# Patient Record
Sex: Male | Born: 1935
Health system: Southern US, Community
[De-identification: ages and names within clinical notes are randomized; demographics above are authoritative.]

## PROBLEM LIST (undated history)

## (undated) DIAGNOSIS — F99 Mental disorder, not otherwise specified: Secondary | ICD-10-CM

## (undated) DIAGNOSIS — F32A Depression, unspecified: Secondary | ICD-10-CM

## (undated) DIAGNOSIS — N400 Enlarged prostate without lower urinary tract symptoms: Secondary | ICD-10-CM

## (undated) DIAGNOSIS — M1990 Unspecified osteoarthritis, unspecified site: Secondary | ICD-10-CM

## (undated) DIAGNOSIS — M109 Gout, unspecified: Secondary | ICD-10-CM

## (undated) DIAGNOSIS — F329 Major depressive disorder, single episode, unspecified: Secondary | ICD-10-CM

## (undated) HISTORY — PX: CHOLECYSTECTOMY: SHX55

## (undated) HISTORY — PX: BACK SURGERY: SHX140

---

## 2005-03-10 ENCOUNTER — Ambulatory Visit: Payer: Self-pay | Admitting: Cardiology

## 2011-09-02 DIAGNOSIS — F329 Major depressive disorder, single episode, unspecified: Secondary | ICD-10-CM | POA: Diagnosis not present

## 2011-09-02 DIAGNOSIS — E871 Hypo-osmolality and hyponatremia: Secondary | ICD-10-CM | POA: Diagnosis not present

## 2011-09-02 DIAGNOSIS — M109 Gout, unspecified: Secondary | ICD-10-CM | POA: Diagnosis not present

## 2011-09-02 DIAGNOSIS — G252 Other specified forms of tremor: Secondary | ICD-10-CM | POA: Diagnosis not present

## 2011-09-02 DIAGNOSIS — F411 Generalized anxiety disorder: Secondary | ICD-10-CM | POA: Diagnosis not present

## 2011-09-02 DIAGNOSIS — J449 Chronic obstructive pulmonary disease, unspecified: Secondary | ICD-10-CM | POA: Diagnosis not present

## 2011-10-14 DIAGNOSIS — J449 Chronic obstructive pulmonary disease, unspecified: Secondary | ICD-10-CM | POA: Diagnosis not present

## 2011-10-14 DIAGNOSIS — M109 Gout, unspecified: Secondary | ICD-10-CM | POA: Diagnosis not present

## 2011-10-17 DIAGNOSIS — I739 Peripheral vascular disease, unspecified: Secondary | ICD-10-CM | POA: Diagnosis not present

## 2011-12-12 DIAGNOSIS — M109 Gout, unspecified: Secondary | ICD-10-CM | POA: Diagnosis not present

## 2011-12-12 DIAGNOSIS — J449 Chronic obstructive pulmonary disease, unspecified: Secondary | ICD-10-CM | POA: Diagnosis not present

## 2011-12-22 DIAGNOSIS — H2510 Age-related nuclear cataract, unspecified eye: Secondary | ICD-10-CM | POA: Diagnosis not present

## 2011-12-22 DIAGNOSIS — Z961 Presence of intraocular lens: Secondary | ICD-10-CM | POA: Diagnosis not present

## 2011-12-22 DIAGNOSIS — H5200 Hypermetropia, unspecified eye: Secondary | ICD-10-CM | POA: Diagnosis not present

## 2012-01-29 DIAGNOSIS — I739 Peripheral vascular disease, unspecified: Secondary | ICD-10-CM | POA: Diagnosis not present

## 2012-02-04 DIAGNOSIS — M109 Gout, unspecified: Secondary | ICD-10-CM | POA: Diagnosis not present

## 2012-02-04 DIAGNOSIS — F411 Generalized anxiety disorder: Secondary | ICD-10-CM | POA: Diagnosis not present

## 2012-02-04 DIAGNOSIS — F329 Major depressive disorder, single episode, unspecified: Secondary | ICD-10-CM | POA: Diagnosis not present

## 2012-02-04 DIAGNOSIS — J449 Chronic obstructive pulmonary disease, unspecified: Secondary | ICD-10-CM | POA: Diagnosis not present

## 2012-02-06 DIAGNOSIS — J449 Chronic obstructive pulmonary disease, unspecified: Secondary | ICD-10-CM | POA: Diagnosis not present

## 2012-02-06 DIAGNOSIS — R319 Hematuria, unspecified: Secondary | ICD-10-CM | POA: Diagnosis not present

## 2012-02-06 DIAGNOSIS — F411 Generalized anxiety disorder: Secondary | ICD-10-CM | POA: Diagnosis not present

## 2012-02-06 DIAGNOSIS — F329 Major depressive disorder, single episode, unspecified: Secondary | ICD-10-CM | POA: Diagnosis not present

## 2012-02-06 DIAGNOSIS — M109 Gout, unspecified: Secondary | ICD-10-CM | POA: Diagnosis not present

## 2012-02-06 DIAGNOSIS — R351 Nocturia: Secondary | ICD-10-CM | POA: Diagnosis not present

## 2012-02-09 DIAGNOSIS — M109 Gout, unspecified: Secondary | ICD-10-CM | POA: Diagnosis not present

## 2012-02-09 DIAGNOSIS — F329 Major depressive disorder, single episode, unspecified: Secondary | ICD-10-CM | POA: Diagnosis not present

## 2012-02-09 DIAGNOSIS — F411 Generalized anxiety disorder: Secondary | ICD-10-CM | POA: Diagnosis not present

## 2012-02-09 DIAGNOSIS — J449 Chronic obstructive pulmonary disease, unspecified: Secondary | ICD-10-CM | POA: Diagnosis not present

## 2012-02-10 DIAGNOSIS — J449 Chronic obstructive pulmonary disease, unspecified: Secondary | ICD-10-CM | POA: Diagnosis not present

## 2012-02-10 DIAGNOSIS — F329 Major depressive disorder, single episode, unspecified: Secondary | ICD-10-CM | POA: Diagnosis not present

## 2012-02-10 DIAGNOSIS — F411 Generalized anxiety disorder: Secondary | ICD-10-CM | POA: Diagnosis not present

## 2012-02-10 DIAGNOSIS — M109 Gout, unspecified: Secondary | ICD-10-CM | POA: Diagnosis not present

## 2012-02-11 DIAGNOSIS — J449 Chronic obstructive pulmonary disease, unspecified: Secondary | ICD-10-CM | POA: Diagnosis not present

## 2012-02-11 DIAGNOSIS — M109 Gout, unspecified: Secondary | ICD-10-CM | POA: Diagnosis not present

## 2012-02-11 DIAGNOSIS — F411 Generalized anxiety disorder: Secondary | ICD-10-CM | POA: Diagnosis not present

## 2012-02-11 DIAGNOSIS — F329 Major depressive disorder, single episode, unspecified: Secondary | ICD-10-CM | POA: Diagnosis not present

## 2012-02-12 DIAGNOSIS — M109 Gout, unspecified: Secondary | ICD-10-CM | POA: Diagnosis not present

## 2012-02-12 DIAGNOSIS — J449 Chronic obstructive pulmonary disease, unspecified: Secondary | ICD-10-CM | POA: Diagnosis not present

## 2012-02-12 DIAGNOSIS — F329 Major depressive disorder, single episode, unspecified: Secondary | ICD-10-CM | POA: Diagnosis not present

## 2012-02-12 DIAGNOSIS — F411 Generalized anxiety disorder: Secondary | ICD-10-CM | POA: Diagnosis not present

## 2012-02-13 DIAGNOSIS — F411 Generalized anxiety disorder: Secondary | ICD-10-CM | POA: Diagnosis not present

## 2012-02-13 DIAGNOSIS — J449 Chronic obstructive pulmonary disease, unspecified: Secondary | ICD-10-CM | POA: Diagnosis not present

## 2012-02-13 DIAGNOSIS — M109 Gout, unspecified: Secondary | ICD-10-CM | POA: Diagnosis not present

## 2012-02-13 DIAGNOSIS — F329 Major depressive disorder, single episode, unspecified: Secondary | ICD-10-CM | POA: Diagnosis not present

## 2012-02-16 DIAGNOSIS — J449 Chronic obstructive pulmonary disease, unspecified: Secondary | ICD-10-CM | POA: Diagnosis not present

## 2012-02-16 DIAGNOSIS — M109 Gout, unspecified: Secondary | ICD-10-CM | POA: Diagnosis not present

## 2012-02-16 DIAGNOSIS — F411 Generalized anxiety disorder: Secondary | ICD-10-CM | POA: Diagnosis not present

## 2012-02-16 DIAGNOSIS — F329 Major depressive disorder, single episode, unspecified: Secondary | ICD-10-CM | POA: Diagnosis not present

## 2012-02-17 DIAGNOSIS — F411 Generalized anxiety disorder: Secondary | ICD-10-CM | POA: Diagnosis not present

## 2012-02-17 DIAGNOSIS — J449 Chronic obstructive pulmonary disease, unspecified: Secondary | ICD-10-CM | POA: Diagnosis not present

## 2012-02-17 DIAGNOSIS — M109 Gout, unspecified: Secondary | ICD-10-CM | POA: Diagnosis not present

## 2012-02-17 DIAGNOSIS — F329 Major depressive disorder, single episode, unspecified: Secondary | ICD-10-CM | POA: Diagnosis not present

## 2012-02-18 DIAGNOSIS — F329 Major depressive disorder, single episode, unspecified: Secondary | ICD-10-CM | POA: Diagnosis not present

## 2012-02-18 DIAGNOSIS — F411 Generalized anxiety disorder: Secondary | ICD-10-CM | POA: Diagnosis not present

## 2012-02-18 DIAGNOSIS — M109 Gout, unspecified: Secondary | ICD-10-CM | POA: Diagnosis not present

## 2012-02-18 DIAGNOSIS — J449 Chronic obstructive pulmonary disease, unspecified: Secondary | ICD-10-CM | POA: Diagnosis not present

## 2012-02-19 DIAGNOSIS — M259 Joint disorder, unspecified: Secondary | ICD-10-CM | POA: Diagnosis not present

## 2012-02-19 DIAGNOSIS — F329 Major depressive disorder, single episode, unspecified: Secondary | ICD-10-CM | POA: Diagnosis not present

## 2012-02-19 DIAGNOSIS — J449 Chronic obstructive pulmonary disease, unspecified: Secondary | ICD-10-CM | POA: Diagnosis not present

## 2012-02-19 DIAGNOSIS — M109 Gout, unspecified: Secondary | ICD-10-CM | POA: Diagnosis not present

## 2012-02-20 DIAGNOSIS — M109 Gout, unspecified: Secondary | ICD-10-CM | POA: Diagnosis not present

## 2012-02-20 DIAGNOSIS — J449 Chronic obstructive pulmonary disease, unspecified: Secondary | ICD-10-CM | POA: Diagnosis not present

## 2012-02-20 DIAGNOSIS — F411 Generalized anxiety disorder: Secondary | ICD-10-CM | POA: Diagnosis not present

## 2012-02-20 DIAGNOSIS — F329 Major depressive disorder, single episode, unspecified: Secondary | ICD-10-CM | POA: Diagnosis not present

## 2012-02-23 DIAGNOSIS — M109 Gout, unspecified: Secondary | ICD-10-CM | POA: Diagnosis not present

## 2012-02-23 DIAGNOSIS — J449 Chronic obstructive pulmonary disease, unspecified: Secondary | ICD-10-CM | POA: Diagnosis not present

## 2012-02-23 DIAGNOSIS — F411 Generalized anxiety disorder: Secondary | ICD-10-CM | POA: Diagnosis not present

## 2012-02-23 DIAGNOSIS — F329 Major depressive disorder, single episode, unspecified: Secondary | ICD-10-CM | POA: Diagnosis not present

## 2012-02-24 DIAGNOSIS — F17200 Nicotine dependence, unspecified, uncomplicated: Secondary | ICD-10-CM | POA: Diagnosis not present

## 2012-02-24 DIAGNOSIS — M109 Gout, unspecified: Secondary | ICD-10-CM | POA: Diagnosis not present

## 2012-02-24 DIAGNOSIS — R109 Unspecified abdominal pain: Secondary | ICD-10-CM | POA: Diagnosis not present

## 2012-02-24 DIAGNOSIS — Z9181 History of falling: Secondary | ICD-10-CM | POA: Diagnosis not present

## 2012-02-24 DIAGNOSIS — N400 Enlarged prostate without lower urinary tract symptoms: Secondary | ICD-10-CM | POA: Diagnosis not present

## 2012-02-24 DIAGNOSIS — K5900 Constipation, unspecified: Secondary | ICD-10-CM | POA: Diagnosis not present

## 2012-02-24 DIAGNOSIS — I951 Orthostatic hypotension: Secondary | ICD-10-CM | POA: Diagnosis not present

## 2012-02-24 DIAGNOSIS — J449 Chronic obstructive pulmonary disease, unspecified: Secondary | ICD-10-CM | POA: Diagnosis not present

## 2012-02-24 DIAGNOSIS — F329 Major depressive disorder, single episode, unspecified: Secondary | ICD-10-CM | POA: Diagnosis not present

## 2012-02-24 DIAGNOSIS — R079 Chest pain, unspecified: Secondary | ICD-10-CM | POA: Diagnosis not present

## 2012-02-24 DIAGNOSIS — F341 Dysthymic disorder: Secondary | ICD-10-CM | POA: Diagnosis not present

## 2012-02-24 DIAGNOSIS — R5383 Other fatigue: Secondary | ICD-10-CM | POA: Diagnosis not present

## 2012-02-24 DIAGNOSIS — R52 Pain, unspecified: Secondary | ICD-10-CM | POA: Diagnosis not present

## 2012-02-24 DIAGNOSIS — S301XXA Contusion of abdominal wall, initial encounter: Secondary | ICD-10-CM | POA: Diagnosis not present

## 2012-02-24 DIAGNOSIS — F411 Generalized anxiety disorder: Secondary | ICD-10-CM | POA: Diagnosis not present

## 2012-02-24 DIAGNOSIS — K449 Diaphragmatic hernia without obstruction or gangrene: Secondary | ICD-10-CM | POA: Diagnosis not present

## 2012-02-24 DIAGNOSIS — Z79899 Other long term (current) drug therapy: Secondary | ICD-10-CM | POA: Diagnosis not present

## 2012-02-24 DIAGNOSIS — R5381 Other malaise: Secondary | ICD-10-CM | POA: Diagnosis not present

## 2012-02-25 DIAGNOSIS — F411 Generalized anxiety disorder: Secondary | ICD-10-CM | POA: Diagnosis not present

## 2012-02-25 DIAGNOSIS — R5381 Other malaise: Secondary | ICD-10-CM | POA: Diagnosis not present

## 2012-02-25 DIAGNOSIS — M109 Gout, unspecified: Secondary | ICD-10-CM | POA: Diagnosis not present

## 2012-02-25 DIAGNOSIS — I951 Orthostatic hypotension: Secondary | ICD-10-CM | POA: Diagnosis not present

## 2012-02-25 DIAGNOSIS — J449 Chronic obstructive pulmonary disease, unspecified: Secondary | ICD-10-CM | POA: Diagnosis not present

## 2012-02-25 DIAGNOSIS — K5900 Constipation, unspecified: Secondary | ICD-10-CM | POA: Diagnosis not present

## 2012-02-25 DIAGNOSIS — F329 Major depressive disorder, single episode, unspecified: Secondary | ICD-10-CM | POA: Diagnosis not present

## 2012-02-25 DIAGNOSIS — S301XXA Contusion of abdominal wall, initial encounter: Secondary | ICD-10-CM | POA: Diagnosis not present

## 2012-02-27 DIAGNOSIS — M109 Gout, unspecified: Secondary | ICD-10-CM | POA: Diagnosis not present

## 2012-02-27 DIAGNOSIS — F411 Generalized anxiety disorder: Secondary | ICD-10-CM | POA: Diagnosis not present

## 2012-02-27 DIAGNOSIS — F329 Major depressive disorder, single episode, unspecified: Secondary | ICD-10-CM | POA: Diagnosis not present

## 2012-02-27 DIAGNOSIS — J449 Chronic obstructive pulmonary disease, unspecified: Secondary | ICD-10-CM | POA: Diagnosis not present

## 2012-03-01 DIAGNOSIS — J449 Chronic obstructive pulmonary disease, unspecified: Secondary | ICD-10-CM | POA: Diagnosis not present

## 2012-03-01 DIAGNOSIS — F329 Major depressive disorder, single episode, unspecified: Secondary | ICD-10-CM | POA: Diagnosis not present

## 2012-03-01 DIAGNOSIS — F411 Generalized anxiety disorder: Secondary | ICD-10-CM | POA: Diagnosis not present

## 2012-03-01 DIAGNOSIS — M109 Gout, unspecified: Secondary | ICD-10-CM | POA: Diagnosis not present

## 2012-03-05 DIAGNOSIS — F329 Major depressive disorder, single episode, unspecified: Secondary | ICD-10-CM | POA: Diagnosis not present

## 2012-03-05 DIAGNOSIS — M109 Gout, unspecified: Secondary | ICD-10-CM | POA: Diagnosis not present

## 2012-03-05 DIAGNOSIS — F411 Generalized anxiety disorder: Secondary | ICD-10-CM | POA: Diagnosis not present

## 2012-03-05 DIAGNOSIS — J449 Chronic obstructive pulmonary disease, unspecified: Secondary | ICD-10-CM | POA: Diagnosis not present

## 2012-03-08 DIAGNOSIS — F411 Generalized anxiety disorder: Secondary | ICD-10-CM | POA: Diagnosis not present

## 2012-03-08 DIAGNOSIS — J449 Chronic obstructive pulmonary disease, unspecified: Secondary | ICD-10-CM | POA: Diagnosis not present

## 2012-03-08 DIAGNOSIS — F329 Major depressive disorder, single episode, unspecified: Secondary | ICD-10-CM | POA: Diagnosis not present

## 2012-03-08 DIAGNOSIS — M109 Gout, unspecified: Secondary | ICD-10-CM | POA: Diagnosis not present

## 2012-03-09 DIAGNOSIS — S20219A Contusion of unspecified front wall of thorax, initial encounter: Secondary | ICD-10-CM | POA: Diagnosis not present

## 2012-03-10 DIAGNOSIS — F411 Generalized anxiety disorder: Secondary | ICD-10-CM | POA: Diagnosis not present

## 2012-03-10 DIAGNOSIS — M109 Gout, unspecified: Secondary | ICD-10-CM | POA: Diagnosis not present

## 2012-03-10 DIAGNOSIS — F329 Major depressive disorder, single episode, unspecified: Secondary | ICD-10-CM | POA: Diagnosis not present

## 2012-03-10 DIAGNOSIS — J449 Chronic obstructive pulmonary disease, unspecified: Secondary | ICD-10-CM | POA: Diagnosis not present

## 2012-03-11 DIAGNOSIS — F329 Major depressive disorder, single episode, unspecified: Secondary | ICD-10-CM | POA: Diagnosis not present

## 2012-03-11 DIAGNOSIS — J449 Chronic obstructive pulmonary disease, unspecified: Secondary | ICD-10-CM | POA: Diagnosis not present

## 2012-03-11 DIAGNOSIS — M109 Gout, unspecified: Secondary | ICD-10-CM | POA: Diagnosis not present

## 2012-03-11 DIAGNOSIS — F411 Generalized anxiety disorder: Secondary | ICD-10-CM | POA: Diagnosis not present

## 2012-03-12 DIAGNOSIS — F329 Major depressive disorder, single episode, unspecified: Secondary | ICD-10-CM | POA: Diagnosis not present

## 2012-03-12 DIAGNOSIS — M109 Gout, unspecified: Secondary | ICD-10-CM | POA: Diagnosis not present

## 2012-03-12 DIAGNOSIS — J449 Chronic obstructive pulmonary disease, unspecified: Secondary | ICD-10-CM | POA: Diagnosis not present

## 2012-03-12 DIAGNOSIS — F411 Generalized anxiety disorder: Secondary | ICD-10-CM | POA: Diagnosis not present

## 2012-03-15 DIAGNOSIS — M109 Gout, unspecified: Secondary | ICD-10-CM | POA: Diagnosis not present

## 2012-03-15 DIAGNOSIS — J449 Chronic obstructive pulmonary disease, unspecified: Secondary | ICD-10-CM | POA: Diagnosis not present

## 2012-03-15 DIAGNOSIS — F411 Generalized anxiety disorder: Secondary | ICD-10-CM | POA: Diagnosis not present

## 2012-03-15 DIAGNOSIS — F329 Major depressive disorder, single episode, unspecified: Secondary | ICD-10-CM | POA: Diagnosis not present

## 2012-03-17 DIAGNOSIS — F411 Generalized anxiety disorder: Secondary | ICD-10-CM | POA: Diagnosis not present

## 2012-03-17 DIAGNOSIS — F329 Major depressive disorder, single episode, unspecified: Secondary | ICD-10-CM | POA: Diagnosis not present

## 2012-03-17 DIAGNOSIS — M109 Gout, unspecified: Secondary | ICD-10-CM | POA: Diagnosis not present

## 2012-03-17 DIAGNOSIS — J449 Chronic obstructive pulmonary disease, unspecified: Secondary | ICD-10-CM | POA: Diagnosis not present

## 2012-03-31 DIAGNOSIS — M109 Gout, unspecified: Secondary | ICD-10-CM | POA: Diagnosis not present

## 2012-03-31 DIAGNOSIS — F329 Major depressive disorder, single episode, unspecified: Secondary | ICD-10-CM | POA: Diagnosis not present

## 2012-03-31 DIAGNOSIS — J449 Chronic obstructive pulmonary disease, unspecified: Secondary | ICD-10-CM | POA: Diagnosis not present

## 2012-03-31 DIAGNOSIS — F411 Generalized anxiety disorder: Secondary | ICD-10-CM | POA: Diagnosis not present

## 2012-04-03 DIAGNOSIS — F17200 Nicotine dependence, unspecified, uncomplicated: Secondary | ICD-10-CM | POA: Diagnosis not present

## 2012-04-03 DIAGNOSIS — Z79899 Other long term (current) drug therapy: Secondary | ICD-10-CM | POA: Diagnosis not present

## 2012-04-03 DIAGNOSIS — S80869A Insect bite (nonvenomous), unspecified lower leg, initial encounter: Secondary | ICD-10-CM | POA: Diagnosis not present

## 2012-04-03 DIAGNOSIS — L02419 Cutaneous abscess of limb, unspecified: Secondary | ICD-10-CM | POA: Diagnosis not present

## 2012-04-03 DIAGNOSIS — M109 Gout, unspecified: Secondary | ICD-10-CM | POA: Diagnosis not present

## 2012-04-03 DIAGNOSIS — L089 Local infection of the skin and subcutaneous tissue, unspecified: Secondary | ICD-10-CM | POA: Diagnosis not present

## 2012-04-05 DIAGNOSIS — L03119 Cellulitis of unspecified part of limb: Secondary | ICD-10-CM | POA: Diagnosis not present

## 2012-04-05 DIAGNOSIS — L02419 Cutaneous abscess of limb, unspecified: Secondary | ICD-10-CM | POA: Diagnosis not present

## 2012-04-07 DIAGNOSIS — H2510 Age-related nuclear cataract, unspecified eye: Secondary | ICD-10-CM | POA: Diagnosis not present

## 2012-04-20 DIAGNOSIS — G608 Other hereditary and idiopathic neuropathies: Secondary | ICD-10-CM | POA: Diagnosis not present

## 2012-04-20 DIAGNOSIS — M79609 Pain in unspecified limb: Secondary | ICD-10-CM | POA: Diagnosis not present

## 2012-04-20 DIAGNOSIS — M25579 Pain in unspecified ankle and joints of unspecified foot: Secondary | ICD-10-CM | POA: Diagnosis not present

## 2012-05-12 DIAGNOSIS — M19079 Primary osteoarthritis, unspecified ankle and foot: Secondary | ICD-10-CM | POA: Diagnosis not present

## 2012-05-12 DIAGNOSIS — M79609 Pain in unspecified limb: Secondary | ICD-10-CM | POA: Diagnosis not present

## 2012-05-21 DIAGNOSIS — I10 Essential (primary) hypertension: Secondary | ICD-10-CM | POA: Diagnosis not present

## 2012-06-02 DIAGNOSIS — M79609 Pain in unspecified limb: Secondary | ICD-10-CM | POA: Diagnosis not present

## 2012-06-02 DIAGNOSIS — M19079 Primary osteoarthritis, unspecified ankle and foot: Secondary | ICD-10-CM | POA: Diagnosis not present

## 2012-06-21 DIAGNOSIS — M19079 Primary osteoarthritis, unspecified ankle and foot: Secondary | ICD-10-CM | POA: Diagnosis not present

## 2012-06-21 DIAGNOSIS — M79609 Pain in unspecified limb: Secondary | ICD-10-CM | POA: Diagnosis not present

## 2012-07-01 DIAGNOSIS — Z961 Presence of intraocular lens: Secondary | ICD-10-CM | POA: Diagnosis not present

## 2012-07-01 DIAGNOSIS — H2589 Other age-related cataract: Secondary | ICD-10-CM | POA: Diagnosis not present

## 2012-07-01 DIAGNOSIS — D2310 Other benign neoplasm of skin of unspecified eyelid, including canthus: Secondary | ICD-10-CM | POA: Diagnosis not present

## 2012-07-01 DIAGNOSIS — H40019 Open angle with borderline findings, low risk, unspecified eye: Secondary | ICD-10-CM | POA: Diagnosis not present

## 2012-07-02 DIAGNOSIS — Z23 Encounter for immunization: Secondary | ICD-10-CM | POA: Diagnosis not present

## 2012-07-05 ENCOUNTER — Encounter (HOSPITAL_COMMUNITY): Payer: Self-pay | Admitting: Pharmacy Technician

## 2012-07-06 ENCOUNTER — Other Ambulatory Visit: Payer: Self-pay

## 2012-07-06 ENCOUNTER — Encounter (HOSPITAL_COMMUNITY)
Admission: RE | Admit: 2012-07-06 | Discharge: 2012-07-06 | Disposition: A | Discharge status: Home / Self Care | Payer: Medicare Other | Source: Ambulatory Visit | Attending: Ophthalmology | Admitting: Ophthalmology

## 2012-07-06 ENCOUNTER — Encounter (HOSPITAL_COMMUNITY): Payer: Self-pay

## 2012-07-06 DIAGNOSIS — H2589 Other age-related cataract: Secondary | ICD-10-CM | POA: Diagnosis not present

## 2012-07-06 DIAGNOSIS — J449 Chronic obstructive pulmonary disease, unspecified: Secondary | ICD-10-CM | POA: Diagnosis not present

## 2012-07-06 DIAGNOSIS — Z01812 Encounter for preprocedural laboratory examination: Secondary | ICD-10-CM | POA: Diagnosis not present

## 2012-07-06 DIAGNOSIS — Z01810 Encounter for preprocedural cardiovascular examination: Secondary | ICD-10-CM | POA: Diagnosis not present

## 2012-07-06 HISTORY — DX: Unspecified osteoarthritis, unspecified site: M19.90

## 2012-07-06 HISTORY — DX: Gout, unspecified: M10.9

## 2012-07-06 HISTORY — DX: Major depressive disorder, single episode, unspecified: F32.9

## 2012-07-06 HISTORY — DX: Depression, unspecified: F32.A

## 2012-07-06 HISTORY — DX: Mental disorder, not otherwise specified: F99

## 2012-07-06 HISTORY — DX: Benign prostatic hyperplasia without lower urinary tract symptoms: N40.0

## 2012-07-06 LAB — BASIC METABOLIC PANEL
CO2: 28 mEq/L (ref 19–32)
Calcium: 9.6 mg/dL (ref 8.4–10.5)
Creatinine, Ser: 0.65 mg/dL (ref 0.50–1.35)
GFR calc Af Amer: >90 mL/min (ref >90)
Sodium: 139 mEq/L (ref 135–145)

## 2012-07-06 LAB — HEMOGLOBIN AND HEMATOCRIT, BLOOD
HCT: 35.4 % — ABNORMAL LOW (ref 39.0–52.0)
Hemoglobin: 12.3 g/dL — ABNORMAL LOW (ref 13.0–17.0)

## 2012-07-06 NOTE — Patient Instructions (Addendum)
Your procedure is scheduled on: 07/08/2012   Report to [facility] at  1300     AM.  Call this number if you have problems the morning of surgery: [phone]   Do not eat food or drink liquids :After Midnight.      Take these medicines the morning of surgery with A SIP OF WATER: flomax,allopurinol,celexa  Do not wear jewelry, make-up or nail polish.  Do not wear lotions, powders, or perfumes. You may wear deodorant.  Do not shave 48 hours prior to surgery.  Do not bring valuables to the hospital.  Contacts, dentures or bridgework may not be worn into surgery.  Leave suitcase in the car. After surgery it may be brought to your room.  For patients admitted to the hospital, checkout time is 11:00 AM the day of discharge.   Patients discharged the day of surgery will not be allowed to drive home.  :     Please read over the following fact sheets that you were given: Coughing and Deep Breathing, Surgical Site Infection Prevention, Anesthesia Post-op Instructions and Care and Recovery After Surgery    Cataract A cataract is a clouding of the lens of the eye. When a lens becomes cloudy, vision is reduced based on the degree and nature of the clouding. Many cataracts reduce vision to some degree. Some cataracts make people more near-sighted as they develop. Other cataracts increase glare. Cataracts that are ignored and become worse can sometimes look white. The white color can be seen through the pupil. CAUSES   Aging. However, cataracts may occur at any age, even in newborns.   Certain drugs.   Trauma to the eye.   Certain diseases such as diabetes.   Specific eye diseases such as chronic inflammation inside the eye or a sudden attack of a rare form of glaucoma.   Inherited or acquired medical problems.  SYMPTOMS   Gradual, progressive drop in vision in the affected eye.   Severe, rapid visual loss. This most often happens when trauma is the cause.  DIAGNOSIS  To detect a cataract,  an eye doctor examines the lens. Cataracts are best diagnosed with an exam of the eyes with the pupils enlarged (dilated) by drops.  TREATMENT  For an early cataract, vision may improve by using different eyeglasses or stronger lighting. If that does not help your vision, surgery is the only effective treatment. A cataract needs to be surgically removed when vision loss interferes with your everyday activities, such as driving, reading, or watching TV. A cataract may also have to be removed if it prevents examination or treatment of another eye problem. Surgery removes the cloudy lens and usually replaces it with a substitute lens (intraocular lens, IOL).  At a time when both you and your doctor agree, the cataract will be surgically removed. If you have cataracts in both eyes, only one is usually removed at a time. This allows the operated eye to heal and be out of danger from any possible problems after surgery (such as infection or poor wound healing). In rare cases, a cataract may be doing damage to your eye. In these cases, your caregiver may advise surgical removal right away. The vast majority of people who have cataract surgery have better vision afterward. HOME CARE INSTRUCTIONS  If you are not planning surgery, you may be asked to do the following:  Use different eyeglasses.   Use stronger or brighter lighting.   Ask your eye doctor about reducing your  medicine dose or changing medicines if it is thought that a medicine caused your cataract. Changing medicines does not make the cataract go away on its own.   Become familiar with your surroundings. Poor vision can lead to injury. Avoid bumping into things on the affected side. You are at a higher risk for tripping or falling.   Exercise extreme care when driving or operating machinery.   Wear sunglasses if you are sensitive to bright light or experiencing problems with glare.  SEEK IMMEDIATE MEDICAL CARE IF:   You have a worsening or  sudden vision loss.   You notice redness, swelling, or increasing pain in the eye.   You have a fever.  Document Released: 08/18/2005 Document Revised: 08/07/2011 Document Reviewed: 04/11/2011 [facility] Patient Information 2012 [place], [place].PATIENT INSTRUCTIONS POST-ANESTHESIA  IMMEDIATELY FOLLOWING SURGERY:  Do not drive or operate machinery for the first twenty four hours after surgery.  Do not make any important decisions for twenty four hours after surgery or while taking narcotic pain medications or sedatives.  If you develop intractable nausea and vomiting or a severe headache please notify your doctor immediately.  FOLLOW-UP:  Please make an appointment with your surgeon as instructed. You do not need to follow up with anesthesia unless specifically instructed to do so.  WOUND CARE INSTRUCTIONS (if applicable):  Keep a dry clean dressing on the anesthesia/puncture wound site if there is drainage.  Once the wound has quit draining you may leave it open to air.  Generally you should leave the bandage intact for twenty four hours unless there is drainage.  If the epidural site drains for more than 36-48 hours please call the anesthesia department.  QUESTIONS?:  Please feel free to call your physician or the hospital operator if you have any questions, and they will be happy to assist you.

## 2012-07-07 MED ORDER — LIDOCAINE HCL 3.5 % OP GEL
OPHTHALMIC | Status: AC
  Filled 2012-07-07: qty 5

## 2012-07-07 MED ORDER — TETRACAINE HCL 0.5 % OP SOLN
OPHTHALMIC | Status: AC
  Filled 2012-07-07: qty 2

## 2012-07-07 MED ORDER — CYCLOPENTOLATE-PHENYLEPHRINE 0.2-1 % OP SOLN
OPHTHALMIC | Status: AC
  Filled 2012-07-07: qty 2

## 2012-07-07 MED ORDER — NEOMYCIN-POLYMYXIN-DEXAMETH 3.5-10000-0.1 OP OINT
TOPICAL_OINTMENT | OPHTHALMIC | Status: AC
  Filled 2012-07-07: qty 3.5

## 2012-07-07 MED ORDER — PHENYLEPHRINE HCL 2.5 % OP SOLN
OPHTHALMIC | Status: AC
  Filled 2012-07-07: qty 2

## 2012-07-07 MED ORDER — LIDOCAINE HCL (PF) 1 % IJ SOLN
INTRAMUSCULAR | Status: AC
  Filled 2012-07-07: qty 2

## 2012-07-08 ENCOUNTER — Encounter (HOSPITAL_COMMUNITY): Payer: Self-pay | Admitting: Anesthesiology

## 2012-07-08 ENCOUNTER — Ambulatory Visit (HOSPITAL_COMMUNITY)
Admission: RE | Admit: 2012-07-08 | Discharge: 2012-07-08 | Disposition: A | Discharge status: Home / Self Care | Payer: Medicare Other | Source: Ambulatory Visit | Attending: Ophthalmology | Admitting: Ophthalmology

## 2012-07-08 ENCOUNTER — Encounter (HOSPITAL_COMMUNITY): Payer: Self-pay | Admitting: *Deleted

## 2012-07-08 ENCOUNTER — Encounter (HOSPITAL_COMMUNITY)
Admission: RE | Disposition: A | Discharge status: Home / Self Care | Payer: Self-pay | Source: Ambulatory Visit | Attending: Ophthalmology

## 2012-07-08 ENCOUNTER — Ambulatory Visit (HOSPITAL_COMMUNITY): Payer: Medicare Other | Admitting: Anesthesiology

## 2012-07-08 DIAGNOSIS — Z01810 Encounter for preprocedural cardiovascular examination: Secondary | ICD-10-CM | POA: Insufficient documentation

## 2012-07-08 DIAGNOSIS — Z01812 Encounter for preprocedural laboratory examination: Secondary | ICD-10-CM | POA: Insufficient documentation

## 2012-07-08 DIAGNOSIS — H2510 Age-related nuclear cataract, unspecified eye: Secondary | ICD-10-CM | POA: Diagnosis not present

## 2012-07-08 DIAGNOSIS — J449 Chronic obstructive pulmonary disease, unspecified: Secondary | ICD-10-CM | POA: Insufficient documentation

## 2012-07-08 DIAGNOSIS — H269 Unspecified cataract: Secondary | ICD-10-CM | POA: Diagnosis not present

## 2012-07-08 DIAGNOSIS — H2589 Other age-related cataract: Secondary | ICD-10-CM | POA: Diagnosis not present

## 2012-07-08 DIAGNOSIS — J4489 Other specified chronic obstructive pulmonary disease: Secondary | ICD-10-CM | POA: Insufficient documentation

## 2012-07-08 HISTORY — PX: CATARACT EXTRACTION W/PHACO: SHX586

## 2012-07-08 SURGERY — PHACOEMULSIFICATION, CATARACT, WITH IOL INSERTION
Anesthesia: Monitor Anesthesia Care | Site: Eye | Laterality: Left | Wound class: Clean

## 2012-07-08 MED ORDER — LACTATED RINGERS IV SOLN
INTRAVENOUS | Status: DC | PRN
  Administered 2012-07-08: 14:00:00 via INTRAVENOUS

## 2012-07-08 MED ORDER — MIDAZOLAM HCL 2 MG/2ML IJ SOLN
INTRAMUSCULAR | Status: AC
  Filled 2012-07-08: qty 2

## 2012-07-08 MED ORDER — POVIDONE-IODINE 5 % OP SOLN
OPHTHALMIC | Status: DC | PRN
  Administered 2012-07-08: 1 via OPHTHALMIC

## 2012-07-08 MED ORDER — BSS IO SOLN
INTRAOCULAR | Status: DC | PRN
  Administered 2012-07-08: 15 mL via INTRAOCULAR

## 2012-07-08 MED ORDER — LIDOCAINE HCL 3.5 % OP GEL
1.0000 "application " | Freq: Once | OPHTHALMIC | Status: AC
  Administered 2012-07-08: 1 via OPHTHALMIC

## 2012-07-08 MED ORDER — LACTATED RINGERS IV SOLN
INTRAVENOUS | Status: DC
  Administered 2012-07-08: 15:00:00 via INTRAVENOUS

## 2012-07-08 MED ORDER — EPINEPHRINE HCL 1 MG/ML IJ SOLN
INTRAOCULAR | Status: DC | PRN
  Administered 2012-07-08: 15:00:00

## 2012-07-08 MED ORDER — PHENYLEPHRINE HCL 2.5 % OP SOLN
1.0000 [drp] | OPHTHALMIC | Status: AC
  Administered 2012-07-08 (×3): 1 [drp] via OPHTHALMIC

## 2012-07-08 MED ORDER — PROVISC 10 MG/ML IO SOLN
INTRAOCULAR | Status: DC | PRN
  Administered 2012-07-08: 8.5 mg via INTRAOCULAR

## 2012-07-08 MED ORDER — MIDAZOLAM HCL 2 MG/2ML IJ SOLN
1.0000 mg | INTRAMUSCULAR | Status: DC | PRN
  Administered 2012-07-08: 2 mg via INTRAVENOUS

## 2012-07-08 MED ORDER — LIDOCAINE HCL (PF) 1 % IJ SOLN
INTRAMUSCULAR | Status: DC | PRN
  Administered 2012-07-08: .4 mL

## 2012-07-08 MED ORDER — CYCLOPENTOLATE-PHENYLEPHRINE 0.2-1 % OP SOLN
1.0000 [drp] | OPHTHALMIC | Status: AC
  Administered 2012-07-08 (×3): 1 [drp] via OPHTHALMIC

## 2012-07-08 MED ORDER — NEOMYCIN-POLYMYXIN-DEXAMETH 0.1 % OP OINT
TOPICAL_OINTMENT | OPHTHALMIC | Status: DC | PRN
  Administered 2012-07-08: 1 via OPHTHALMIC

## 2012-07-08 MED ORDER — TETRACAINE HCL 0.5 % OP SOLN
1.0000 [drp] | OPHTHALMIC | Status: AC
  Administered 2012-07-08 (×3): 1 [drp] via OPHTHALMIC

## 2012-07-08 SURGICAL SUPPLY — 32 items

## 2012-07-08 NOTE — Preoperative (Signed)
Beta Blockers   Reason not to administer Beta Blockers:Not Applicable 

## 2012-07-08 NOTE — Transfer of Care (Signed)
  Anesthesia Post-op Note  Patient: John Munoz  Procedure(s) Performed: Procedure(s) (LRB) with comments: CATARACT EXTRACTION PHACO AND INTRAOCULAR LENS PLACEMENT (IOC) (Left) - CDE 25.22  Patient Location: PACU  Anesthesia Type: MAC  Level of Consciousness: awake, alert , oriented and patient cooperative  Airway and Oxygen Therapy: Patient Spontanous Breathing room air  Post-op Pain: mild  Post-op Assessment: Post-op Vital signs reviewed, Patient's Cardiovascular Status Stable, Respiratory Function Stable, Patent Airway and No signs of Nausea or vomiting  Post-op Vital Signs: Reviewed and stable  Complications: No apparent anesthesia complications  

## 2012-07-08 NOTE — Anesthesia Preprocedure Evaluation (Addendum)
Anesthesia Evaluation  Patient identified by MRN, date of birth, ID band Patient awake    Reviewed: Allergy & Precautions, H&P , NPO status , Patient's Chart, lab work & pertinent test results  Airway Mallampati: II TM Distance: >3 FB     Dental  (+) Edentulous Upper and Edentulous Lower   Pulmonary COPDCurrent Smoker,  breath sounds clear to auscultation        Cardiovascular negative cardio ROS  Rhythm:Regular Rate:Normal     Neuro/Psych PSYCHIATRIC DISORDERS Depression    GI/Hepatic   Endo/Other    Renal/GU      Musculoskeletal   Abdominal   Peds  Hematology   Anesthesia Other Findings   Reproductive/Obstetrics                          Anesthesia Physical Anesthesia Plan  ASA: III  Anesthesia Plan: MAC   Post-op Pain Management:    Induction: Intravenous  Airway Management Planned: Nasal Cannula  Additional Equipment:   Intra-op Plan:   Post-operative Plan:   Informed Consent: I have reviewed the patients History and Physical, chart, labs and discussed the procedure including the risks, benefits and alternatives for the proposed anesthesia with the patient or authorized representative who has indicated his/her understanding and acceptance.     Plan Discussed with:   Anesthesia Plan Comments:         Anesthesia Quick Evaluation

## 2012-07-08 NOTE — Brief Op Note (Signed)
Pre-Op Dx: Cataract OS Post-Op Dx: Cataract OS Surgeon: Gemma Payor Anesthesia: Topical with MAC Surgery: Cataract Extraction with Intraocular lens Implant OS Implant: Alcon, Model [id number] Specimen: None Complications: None

## 2012-07-08 NOTE — Anesthesia Procedure Notes (Signed)
Procedure Name: MAC Date/Time: 07/08/2012 2:36 PM Performed by: Carolyne Littles, Stephaney Steven L Pre-anesthesia Checklist: Patient identified, Patient being monitored, Emergency Drugs available, Timeout performed and Suction available Oxygen Delivery Method: Nasal cannula

## 2012-07-08 NOTE — Anesthesia Postprocedure Evaluation (Signed)
  Anesthesia Post-op Note  Patient: John Munoz  Procedure(s) Performed: Procedure(s) (LRB) with comments: CATARACT EXTRACTION PHACO AND INTRAOCULAR LENS PLACEMENT (IOC) (Left) - CDE 25.22  Patient Location: PACU  Anesthesia Type: MAC  Level of Consciousness: awake, alert , oriented and patient cooperative  Airway and Oxygen Therapy: Patient Spontanous Breathing room air  Post-op Pain: mild  Post-op Assessment: Post-op Vital signs reviewed, Patient's Cardiovascular Status Stable, Respiratory Function Stable, Patent Airway and No signs of Nausea or vomiting  Post-op Vital Signs: Reviewed and stable  Complications: No apparent anesthesia complications

## 2012-07-08 NOTE — H&P (Signed)
I have reviewed the H&P, the patient was re-examined, and I have identified no interval changes in medical condition and plan of care since the history and physical of record  

## 2012-07-09 DIAGNOSIS — H2181 Floppy iris syndrome: Secondary | ICD-10-CM | POA: Diagnosis not present

## 2012-07-09 DIAGNOSIS — H2589 Other age-related cataract: Secondary | ICD-10-CM | POA: Diagnosis not present

## 2012-07-09 NOTE — Op Note (Signed)
[name], John NO.:  [id number]  MEDICAL RECORD NO.:  [id number]  LOCATION:  [facility]                          FACILITY:  [facility]  PHYSICIAN:  Susanne Greenhouse, MD       DATE OF BIRTH:  1933/02/20  DATE OF PROCEDURE:  07/08/2012 DATE OF DISCHARGE:  07/08/2012                              OPERATIVE REPORT   PREOPERATIVE DIAGNOSIS:  Combined cataract, left eye, diagnosis code 366.19.  POSTOPERATIVE DIAGNOSES:  Combined cataract, left eye, diagnosis code 366.19.  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  OPERATION PERFORMED:  Phacoemulsification with posterior chamber intraocular lens implantation, left eye.  SURGEON:  Bonne Dolores. Atziry Baranski, MD  ANESTHESIA:  Topical with monitored anesthesia care and IV sedation.  OPERATIVE SUMMARY:  In the preoperative area, dilating drops were placed into the left eye.  The patient was then brought into the operating room where she was placed under general anesthesia.  The eye was then prepped and draped.  Beginning with a 75 blade, a paracentesis port was made at the surgeon's 2 o'clock position.  The anterior chamber was then filled with a 1% nonpreserved lidocaine solution with epinephrine.  This was followed by Viscoat to deepen the chamber.  A small fornix-based peritomy was performed superiorly.  Next, a single iris hook was placed through the limbus superiorly.  A 2.4-mm keratome blade was then used to make a clear corneal incision over the iris hook.  A bent cystotome needle and Utrata forceps were used to create a continuous tear capsulotomy.  Hydrodissection was performed using balanced salt solution on a fine cannula.  The lens nucleus was then removed using phacoemulsification in a quadrant cracking technique.  The cortical material was then removed with irrigation and aspiration.  The capsular bag and anterior chamber were refilled with Provisc.  The wound was widened to approximately 3 mm and a posterior  chamber intraocular lens was placed into the capsular bag without difficulty using an [employer] lens injecting system.  A single 10-0 nylon suture was then used to close the incision as well as stromal hydration.  The Provisc was removed from the anterior chamber and capsular bag with irrigation and aspiration.  At this point, the wounds were tested for leak, which were negative.  The anterior chamber remained deep and stable.  The patient tolerated the procedure well.  There were no operative complications, and she awoke from general anesthesia without problem.  No surgical specimens.  Prosthetic device used Alcon AcrySof posterior chamber lens, model [id number], power of 22.0, serial number is [id number].          ______________________________ Susanne Greenhouse, MD     KEH/MEDQ  D:  07/08/2012  T:  07/09/2012  Job:  [id number]

## 2012-07-12 ENCOUNTER — Encounter (HOSPITAL_COMMUNITY): Payer: Self-pay | Admitting: Ophthalmology

## 2012-07-12 DIAGNOSIS — I739 Peripheral vascular disease, unspecified: Secondary | ICD-10-CM | POA: Diagnosis not present

## 2012-07-12 DIAGNOSIS — M79609 Pain in unspecified limb: Secondary | ICD-10-CM | POA: Diagnosis not present

## 2012-07-12 DIAGNOSIS — M779 Enthesopathy, unspecified: Secondary | ICD-10-CM | POA: Diagnosis not present

## 2012-08-02 DIAGNOSIS — M79609 Pain in unspecified limb: Secondary | ICD-10-CM | POA: Diagnosis not present

## 2012-08-02 DIAGNOSIS — M19079 Primary osteoarthritis, unspecified ankle and foot: Secondary | ICD-10-CM | POA: Diagnosis not present

## 2012-08-20 DIAGNOSIS — K921 Melena: Secondary | ICD-10-CM | POA: Diagnosis not present

## 2012-09-02 DIAGNOSIS — R195 Other fecal abnormalities: Secondary | ICD-10-CM | POA: Diagnosis not present

## 2012-09-20 DIAGNOSIS — I739 Peripheral vascular disease, unspecified: Secondary | ICD-10-CM | POA: Diagnosis not present

## 2012-10-21 DIAGNOSIS — Z125 Encounter for screening for malignant neoplasm of prostate: Secondary | ICD-10-CM | POA: Diagnosis not present

## 2012-10-21 DIAGNOSIS — G608 Other hereditary and idiopathic neuropathies: Secondary | ICD-10-CM | POA: Diagnosis not present

## 2012-10-21 DIAGNOSIS — E559 Vitamin D deficiency, unspecified: Secondary | ICD-10-CM | POA: Diagnosis not present

## 2012-10-21 DIAGNOSIS — K5900 Constipation, unspecified: Secondary | ICD-10-CM | POA: Diagnosis not present

## 2012-10-21 DIAGNOSIS — N3941 Urge incontinence: Secondary | ICD-10-CM | POA: Diagnosis not present

## 2012-11-29 DIAGNOSIS — I739 Peripheral vascular disease, unspecified: Secondary | ICD-10-CM | POA: Diagnosis not present

## 2012-12-30 DIAGNOSIS — R1012 Left upper quadrant pain: Secondary | ICD-10-CM | POA: Diagnosis not present

## 2012-12-30 DIAGNOSIS — M109 Gout, unspecified: Secondary | ICD-10-CM | POA: Diagnosis not present

## 2012-12-30 DIAGNOSIS — R079 Chest pain, unspecified: Secondary | ICD-10-CM | POA: Diagnosis not present

## 2012-12-30 DIAGNOSIS — F17200 Nicotine dependence, unspecified, uncomplicated: Secondary | ICD-10-CM | POA: Diagnosis not present

## 2012-12-30 DIAGNOSIS — K2970 Gastritis, unspecified, without bleeding: Secondary | ICD-10-CM | POA: Diagnosis not present

## 2012-12-30 DIAGNOSIS — R1110 Vomiting, unspecified: Secondary | ICD-10-CM | POA: Diagnosis not present

## 2012-12-30 DIAGNOSIS — R197 Diarrhea, unspecified: Secondary | ICD-10-CM | POA: Diagnosis not present

## 2012-12-30 DIAGNOSIS — N189 Chronic kidney disease, unspecified: Secondary | ICD-10-CM | POA: Diagnosis not present

## 2012-12-30 DIAGNOSIS — R10819 Abdominal tenderness, unspecified site: Secondary | ICD-10-CM | POA: Diagnosis not present

## 2012-12-30 DIAGNOSIS — Z79899 Other long term (current) drug therapy: Secondary | ICD-10-CM | POA: Diagnosis not present

## 2012-12-30 DIAGNOSIS — S3981XA Other specified injuries of abdomen, initial encounter: Secondary | ICD-10-CM | POA: Diagnosis not present

## 2012-12-30 DIAGNOSIS — J438 Other emphysema: Secondary | ICD-10-CM | POA: Diagnosis not present

## 2013-01-26 DIAGNOSIS — M79609 Pain in unspecified limb: Secondary | ICD-10-CM | POA: Diagnosis not present

## 2013-01-26 DIAGNOSIS — L03119 Cellulitis of unspecified part of limb: Secondary | ICD-10-CM | POA: Diagnosis not present

## 2013-01-26 DIAGNOSIS — L02619 Cutaneous abscess of unspecified foot: Secondary | ICD-10-CM | POA: Diagnosis not present

## 2013-01-28 DIAGNOSIS — I10 Essential (primary) hypertension: Secondary | ICD-10-CM | POA: Diagnosis not present

## 2013-02-09 DIAGNOSIS — I739 Peripheral vascular disease, unspecified: Secondary | ICD-10-CM | POA: Diagnosis not present

## 2013-02-09 DIAGNOSIS — B353 Tinea pedis: Secondary | ICD-10-CM | POA: Diagnosis not present

## 2013-02-09 DIAGNOSIS — M79609 Pain in unspecified limb: Secondary | ICD-10-CM | POA: Diagnosis not present

## 2013-04-20 DIAGNOSIS — I739 Peripheral vascular disease, unspecified: Secondary | ICD-10-CM | POA: Diagnosis not present

## 2013-05-04 DIAGNOSIS — M779 Enthesopathy, unspecified: Secondary | ICD-10-CM | POA: Diagnosis not present

## 2013-05-04 DIAGNOSIS — M79609 Pain in unspecified limb: Secondary | ICD-10-CM | POA: Diagnosis not present

## 2013-05-12 DIAGNOSIS — E119 Type 2 diabetes mellitus without complications: Secondary | ICD-10-CM | POA: Diagnosis not present

## 2013-05-12 DIAGNOSIS — Z131 Encounter for screening for diabetes mellitus: Secondary | ICD-10-CM | POA: Diagnosis not present

## 2013-05-12 DIAGNOSIS — M259 Joint disorder, unspecified: Secondary | ICD-10-CM | POA: Diagnosis not present

## 2013-05-16 DIAGNOSIS — H26499 Other secondary cataract, unspecified eye: Secondary | ICD-10-CM | POA: Diagnosis not present

## 2013-06-07 DIAGNOSIS — K219 Gastro-esophageal reflux disease without esophagitis: Secondary | ICD-10-CM | POA: Diagnosis not present

## 2013-06-07 DIAGNOSIS — F329 Major depressive disorder, single episode, unspecified: Secondary | ICD-10-CM | POA: Diagnosis not present

## 2013-06-07 DIAGNOSIS — K648 Other hemorrhoids: Secondary | ICD-10-CM | POA: Diagnosis not present

## 2013-06-07 DIAGNOSIS — Z1211 Encounter for screening for malignant neoplasm of colon: Secondary | ICD-10-CM | POA: Diagnosis not present

## 2013-06-07 DIAGNOSIS — D126 Benign neoplasm of colon, unspecified: Secondary | ICD-10-CM | POA: Diagnosis not present

## 2013-06-07 DIAGNOSIS — F17200 Nicotine dependence, unspecified, uncomplicated: Secondary | ICD-10-CM | POA: Diagnosis not present

## 2013-06-07 DIAGNOSIS — Z803 Family history of malignant neoplasm of breast: Secondary | ICD-10-CM | POA: Diagnosis not present

## 2013-06-07 DIAGNOSIS — K5900 Constipation, unspecified: Secondary | ICD-10-CM | POA: Diagnosis not present

## 2013-06-07 DIAGNOSIS — Z801 Family history of malignant neoplasm of trachea, bronchus and lung: Secondary | ICD-10-CM | POA: Diagnosis not present

## 2013-06-07 DIAGNOSIS — M109 Gout, unspecified: Secondary | ICD-10-CM | POA: Diagnosis not present

## 2013-06-07 DIAGNOSIS — Z800 Family history of malignant neoplasm of digestive organs: Secondary | ICD-10-CM | POA: Diagnosis not present

## 2013-06-07 DIAGNOSIS — Z79899 Other long term (current) drug therapy: Secondary | ICD-10-CM | POA: Diagnosis not present

## 2013-06-07 DIAGNOSIS — K5730 Diverticulosis of large intestine without perforation or abscess without bleeding: Secondary | ICD-10-CM | POA: Diagnosis not present

## 2013-06-07 DIAGNOSIS — J449 Chronic obstructive pulmonary disease, unspecified: Secondary | ICD-10-CM | POA: Diagnosis not present

## 2013-06-21 DIAGNOSIS — R32 Unspecified urinary incontinence: Secondary | ICD-10-CM | POA: Diagnosis not present

## 2013-06-21 DIAGNOSIS — M109 Gout, unspecified: Secondary | ICD-10-CM | POA: Diagnosis not present

## 2013-06-21 DIAGNOSIS — J441 Chronic obstructive pulmonary disease with (acute) exacerbation: Secondary | ICD-10-CM | POA: Diagnosis not present

## 2013-06-23 DIAGNOSIS — D126 Benign neoplasm of colon, unspecified: Secondary | ICD-10-CM | POA: Diagnosis not present

## 2013-06-29 DIAGNOSIS — I739 Peripheral vascular disease, unspecified: Secondary | ICD-10-CM | POA: Diagnosis not present

## 2013-08-11 DIAGNOSIS — M109 Gout, unspecified: Secondary | ICD-10-CM | POA: Diagnosis not present

## 2013-08-11 DIAGNOSIS — I10 Essential (primary) hypertension: Secondary | ICD-10-CM | POA: Diagnosis not present

## 2013-08-11 DIAGNOSIS — Z0000 Encounter for general adult medical examination without abnormal findings: Secondary | ICD-10-CM | POA: Diagnosis not present

## 2013-08-12 DIAGNOSIS — Z23 Encounter for immunization: Secondary | ICD-10-CM | POA: Diagnosis not present

## 2013-08-15 DIAGNOSIS — Z961 Presence of intraocular lens: Secondary | ICD-10-CM | POA: Diagnosis not present

## 2013-08-15 DIAGNOSIS — H26499 Other secondary cataract, unspecified eye: Secondary | ICD-10-CM | POA: Diagnosis not present

## 2013-08-20 DIAGNOSIS — J441 Chronic obstructive pulmonary disease with (acute) exacerbation: Secondary | ICD-10-CM | POA: Diagnosis not present

## 2013-08-20 DIAGNOSIS — M109 Gout, unspecified: Secondary | ICD-10-CM | POA: Diagnosis not present

## 2013-08-20 DIAGNOSIS — R32 Unspecified urinary incontinence: Secondary | ICD-10-CM | POA: Diagnosis not present

## 2013-09-09 DIAGNOSIS — H61899 Other specified disorders of external ear, unspecified ear: Secondary | ICD-10-CM | POA: Diagnosis not present

## 2013-09-21 DIAGNOSIS — I739 Peripheral vascular disease, unspecified: Secondary | ICD-10-CM | POA: Diagnosis not present

## 2013-10-19 DIAGNOSIS — M109 Gout, unspecified: Secondary | ICD-10-CM | POA: Diagnosis not present

## 2013-10-19 DIAGNOSIS — J441 Chronic obstructive pulmonary disease with (acute) exacerbation: Secondary | ICD-10-CM | POA: Diagnosis not present

## 2013-10-19 DIAGNOSIS — R32 Unspecified urinary incontinence: Secondary | ICD-10-CM | POA: Diagnosis not present

## 2013-10-24 DIAGNOSIS — J029 Acute pharyngitis, unspecified: Secondary | ICD-10-CM | POA: Diagnosis not present

## 2013-10-25 DIAGNOSIS — M2040 Other hammer toe(s) (acquired), unspecified foot: Secondary | ICD-10-CM | POA: Diagnosis not present

## 2013-10-25 DIAGNOSIS — M79609 Pain in unspecified limb: Secondary | ICD-10-CM | POA: Diagnosis not present

## 2013-11-02 DIAGNOSIS — M25579 Pain in unspecified ankle and joints of unspecified foot: Secondary | ICD-10-CM | POA: Diagnosis not present

## 2013-11-02 DIAGNOSIS — M79609 Pain in unspecified limb: Secondary | ICD-10-CM | POA: Diagnosis not present

## 2013-11-10 DIAGNOSIS — M949 Disorder of cartilage, unspecified: Secondary | ICD-10-CM | POA: Diagnosis not present

## 2013-11-10 DIAGNOSIS — Z13820 Encounter for screening for osteoporosis: Secondary | ICD-10-CM | POA: Diagnosis not present

## 2013-11-10 DIAGNOSIS — M810 Age-related osteoporosis without current pathological fracture: Secondary | ICD-10-CM | POA: Diagnosis not present

## 2013-11-10 DIAGNOSIS — M899 Disorder of bone, unspecified: Secondary | ICD-10-CM | POA: Diagnosis not present

## 2013-11-23 DIAGNOSIS — I739 Peripheral vascular disease, unspecified: Secondary | ICD-10-CM | POA: Diagnosis not present

## 2013-12-18 DIAGNOSIS — M109 Gout, unspecified: Secondary | ICD-10-CM | POA: Diagnosis not present

## 2013-12-18 DIAGNOSIS — J441 Chronic obstructive pulmonary disease with (acute) exacerbation: Secondary | ICD-10-CM | POA: Diagnosis not present

## 2013-12-18 DIAGNOSIS — R32 Unspecified urinary incontinence: Secondary | ICD-10-CM | POA: Diagnosis not present

## 2013-12-28 DIAGNOSIS — M25579 Pain in unspecified ankle and joints of unspecified foot: Secondary | ICD-10-CM | POA: Diagnosis not present

## 2013-12-28 DIAGNOSIS — M79609 Pain in unspecified limb: Secondary | ICD-10-CM | POA: Diagnosis not present

## 2014-01-20 DIAGNOSIS — L570 Actinic keratosis: Secondary | ICD-10-CM | POA: Diagnosis not present

## 2014-01-20 DIAGNOSIS — M76899 Other specified enthesopathies of unspecified lower limb, excluding foot: Secondary | ICD-10-CM | POA: Diagnosis not present

## 2014-02-13 DIAGNOSIS — Z9889 Other specified postprocedural states: Secondary | ICD-10-CM | POA: Diagnosis not present

## 2014-02-13 DIAGNOSIS — K449 Diaphragmatic hernia without obstruction or gangrene: Secondary | ICD-10-CM | POA: Diagnosis not present

## 2014-02-13 DIAGNOSIS — M169 Osteoarthritis of hip, unspecified: Secondary | ICD-10-CM | POA: Diagnosis not present

## 2014-02-13 DIAGNOSIS — I252 Old myocardial infarction: Secondary | ICD-10-CM | POA: Diagnosis not present

## 2014-02-13 DIAGNOSIS — J449 Chronic obstructive pulmonary disease, unspecified: Secondary | ICD-10-CM | POA: Diagnosis not present

## 2014-02-13 DIAGNOSIS — R51 Headache: Secondary | ICD-10-CM | POA: Diagnosis not present

## 2014-02-13 DIAGNOSIS — M25519 Pain in unspecified shoulder: Secondary | ICD-10-CM | POA: Diagnosis not present

## 2014-02-13 DIAGNOSIS — K219 Gastro-esophageal reflux disease without esophagitis: Secondary | ICD-10-CM | POA: Diagnosis not present

## 2014-02-13 DIAGNOSIS — M19019 Primary osteoarthritis, unspecified shoulder: Secondary | ICD-10-CM | POA: Diagnosis not present

## 2014-02-13 DIAGNOSIS — R0602 Shortness of breath: Secondary | ICD-10-CM | POA: Diagnosis not present

## 2014-02-13 DIAGNOSIS — M109 Gout, unspecified: Secondary | ICD-10-CM | POA: Diagnosis not present

## 2014-02-13 DIAGNOSIS — M1610 Unilateral primary osteoarthritis, unspecified hip: Secondary | ICD-10-CM | POA: Diagnosis not present

## 2014-02-13 DIAGNOSIS — R0789 Other chest pain: Secondary | ICD-10-CM | POA: Diagnosis not present

## 2014-02-13 DIAGNOSIS — N400 Enlarged prostate without lower urinary tract symptoms: Secondary | ICD-10-CM | POA: Diagnosis not present

## 2014-02-13 DIAGNOSIS — J8410 Pulmonary fibrosis, unspecified: Secondary | ICD-10-CM | POA: Diagnosis not present

## 2014-02-13 DIAGNOSIS — F329 Major depressive disorder, single episode, unspecified: Secondary | ICD-10-CM | POA: Diagnosis not present

## 2014-02-13 DIAGNOSIS — J4489 Other specified chronic obstructive pulmonary disease: Secondary | ICD-10-CM | POA: Diagnosis not present

## 2014-02-13 DIAGNOSIS — Z79899 Other long term (current) drug therapy: Secondary | ICD-10-CM | POA: Diagnosis not present

## 2014-02-13 DIAGNOSIS — M25559 Pain in unspecified hip: Secondary | ICD-10-CM | POA: Diagnosis not present

## 2014-02-13 DIAGNOSIS — F3289 Other specified depressive episodes: Secondary | ICD-10-CM | POA: Diagnosis not present

## 2014-02-13 DIAGNOSIS — J180 Bronchopneumonia, unspecified organism: Secondary | ICD-10-CM | POA: Diagnosis not present

## 2014-02-14 DIAGNOSIS — J180 Bronchopneumonia, unspecified organism: Secondary | ICD-10-CM | POA: Diagnosis not present

## 2014-02-14 DIAGNOSIS — M109 Gout, unspecified: Secondary | ICD-10-CM | POA: Diagnosis not present

## 2014-02-14 DIAGNOSIS — F3289 Other specified depressive episodes: Secondary | ICD-10-CM | POA: Diagnosis not present

## 2014-02-14 DIAGNOSIS — N400 Enlarged prostate without lower urinary tract symptoms: Secondary | ICD-10-CM | POA: Diagnosis not present

## 2014-02-14 DIAGNOSIS — F329 Major depressive disorder, single episode, unspecified: Secondary | ICD-10-CM | POA: Diagnosis not present

## 2014-02-14 DIAGNOSIS — R0602 Shortness of breath: Secondary | ICD-10-CM | POA: Diagnosis not present

## 2014-02-15 DIAGNOSIS — F329 Major depressive disorder, single episode, unspecified: Secondary | ICD-10-CM | POA: Diagnosis not present

## 2014-02-15 DIAGNOSIS — J180 Bronchopneumonia, unspecified organism: Secondary | ICD-10-CM | POA: Diagnosis not present

## 2014-02-15 DIAGNOSIS — R0602 Shortness of breath: Secondary | ICD-10-CM | POA: Diagnosis not present

## 2014-02-15 DIAGNOSIS — M109 Gout, unspecified: Secondary | ICD-10-CM | POA: Diagnosis not present

## 2014-02-15 DIAGNOSIS — F3289 Other specified depressive episodes: Secondary | ICD-10-CM | POA: Diagnosis not present

## 2014-02-15 DIAGNOSIS — N400 Enlarged prostate without lower urinary tract symptoms: Secondary | ICD-10-CM | POA: Diagnosis not present

## 2014-02-16 DIAGNOSIS — J45901 Unspecified asthma with (acute) exacerbation: Secondary | ICD-10-CM | POA: Diagnosis not present

## 2014-02-16 DIAGNOSIS — J441 Chronic obstructive pulmonary disease with (acute) exacerbation: Secondary | ICD-10-CM | POA: Diagnosis not present

## 2014-02-16 DIAGNOSIS — M109 Gout, unspecified: Secondary | ICD-10-CM | POA: Diagnosis not present

## 2014-02-16 DIAGNOSIS — R32 Unspecified urinary incontinence: Secondary | ICD-10-CM | POA: Diagnosis not present

## 2014-02-17 DIAGNOSIS — Z136 Encounter for screening for cardiovascular disorders: Secondary | ICD-10-CM | POA: Diagnosis not present

## 2014-02-27 DIAGNOSIS — M25519 Pain in unspecified shoulder: Secondary | ICD-10-CM | POA: Diagnosis not present

## 2014-02-27 DIAGNOSIS — M76899 Other specified enthesopathies of unspecified lower limb, excluding foot: Secondary | ICD-10-CM | POA: Diagnosis not present

## 2014-03-28 DIAGNOSIS — I739 Peripheral vascular disease, unspecified: Secondary | ICD-10-CM | POA: Diagnosis not present

## 2014-04-17 DIAGNOSIS — M109 Gout, unspecified: Secondary | ICD-10-CM | POA: Diagnosis not present

## 2014-04-17 DIAGNOSIS — R32 Unspecified urinary incontinence: Secondary | ICD-10-CM | POA: Diagnosis not present

## 2014-04-17 DIAGNOSIS — J441 Chronic obstructive pulmonary disease with (acute) exacerbation: Secondary | ICD-10-CM | POA: Diagnosis not present

## 2014-05-02 DIAGNOSIS — M79609 Pain in unspecified limb: Secondary | ICD-10-CM | POA: Diagnosis not present

## 2014-05-02 DIAGNOSIS — G608 Other hereditary and idiopathic neuropathies: Secondary | ICD-10-CM | POA: Diagnosis not present

## 2014-05-02 DIAGNOSIS — G5790 Unspecified mononeuropathy of unspecified lower limb: Secondary | ICD-10-CM | POA: Diagnosis not present

## 2014-05-06 DIAGNOSIS — R079 Chest pain, unspecified: Secondary | ICD-10-CM | POA: Diagnosis not present

## 2014-05-06 DIAGNOSIS — K449 Diaphragmatic hernia without obstruction or gangrene: Secondary | ICD-10-CM | POA: Diagnosis not present

## 2014-05-06 DIAGNOSIS — M545 Low back pain, unspecified: Secondary | ICD-10-CM | POA: Diagnosis not present

## 2014-05-06 DIAGNOSIS — S7000XA Contusion of unspecified hip, initial encounter: Secondary | ICD-10-CM | POA: Diagnosis not present

## 2014-05-06 DIAGNOSIS — S79919A Unspecified injury of unspecified hip, initial encounter: Secondary | ICD-10-CM | POA: Diagnosis not present

## 2014-05-06 DIAGNOSIS — W19XXXA Unspecified fall, initial encounter: Secondary | ICD-10-CM | POA: Diagnosis not present

## 2014-05-06 DIAGNOSIS — S79929A Unspecified injury of unspecified thigh, initial encounter: Secondary | ICD-10-CM | POA: Diagnosis not present

## 2014-05-06 DIAGNOSIS — M109 Gout, unspecified: Secondary | ICD-10-CM | POA: Diagnosis not present

## 2014-05-06 DIAGNOSIS — M25559 Pain in unspecified hip: Secondary | ICD-10-CM | POA: Diagnosis not present

## 2014-05-06 DIAGNOSIS — N400 Enlarged prostate without lower urinary tract symptoms: Secondary | ICD-10-CM | POA: Diagnosis not present

## 2014-05-07 DIAGNOSIS — M545 Low back pain, unspecified: Secondary | ICD-10-CM | POA: Diagnosis not present

## 2014-05-07 DIAGNOSIS — K449 Diaphragmatic hernia without obstruction or gangrene: Secondary | ICD-10-CM | POA: Diagnosis not present

## 2014-05-07 DIAGNOSIS — R079 Chest pain, unspecified: Secondary | ICD-10-CM | POA: Diagnosis not present

## 2014-05-07 DIAGNOSIS — M109 Gout, unspecified: Secondary | ICD-10-CM | POA: Diagnosis not present

## 2014-05-07 DIAGNOSIS — N400 Enlarged prostate without lower urinary tract symptoms: Secondary | ICD-10-CM | POA: Diagnosis not present

## 2014-05-15 DIAGNOSIS — F341 Dysthymic disorder: Secondary | ICD-10-CM | POA: Diagnosis not present

## 2014-05-15 DIAGNOSIS — I959 Hypotension, unspecified: Secondary | ICD-10-CM | POA: Diagnosis not present

## 2014-05-15 DIAGNOSIS — M545 Low back pain, unspecified: Secondary | ICD-10-CM | POA: Diagnosis not present

## 2014-05-15 DIAGNOSIS — Z79899 Other long term (current) drug therapy: Secondary | ICD-10-CM | POA: Diagnosis not present

## 2014-05-15 DIAGNOSIS — J984 Other disorders of lung: Secondary | ICD-10-CM | POA: Diagnosis not present

## 2014-05-15 DIAGNOSIS — J449 Chronic obstructive pulmonary disease, unspecified: Secondary | ICD-10-CM | POA: Diagnosis not present

## 2014-05-15 DIAGNOSIS — D539 Nutritional anemia, unspecified: Secondary | ICD-10-CM | POA: Diagnosis not present

## 2014-05-15 DIAGNOSIS — R29818 Other symptoms and signs involving the nervous system: Secondary | ICD-10-CM | POA: Diagnosis not present

## 2014-05-15 DIAGNOSIS — F17200 Nicotine dependence, unspecified, uncomplicated: Secondary | ICD-10-CM | POA: Diagnosis not present

## 2014-05-15 DIAGNOSIS — K219 Gastro-esophageal reflux disease without esophagitis: Secondary | ICD-10-CM | POA: Diagnosis not present

## 2014-05-15 DIAGNOSIS — G8929 Other chronic pain: Secondary | ICD-10-CM | POA: Diagnosis not present

## 2014-05-15 DIAGNOSIS — R55 Syncope and collapse: Secondary | ICD-10-CM | POA: Diagnosis not present

## 2014-05-15 DIAGNOSIS — R079 Chest pain, unspecified: Secondary | ICD-10-CM | POA: Diagnosis not present

## 2014-05-15 DIAGNOSIS — I951 Orthostatic hypotension: Secondary | ICD-10-CM | POA: Diagnosis not present

## 2014-05-15 DIAGNOSIS — N400 Enlarged prostate without lower urinary tract symptoms: Secondary | ICD-10-CM | POA: Diagnosis not present

## 2014-05-15 DIAGNOSIS — R0789 Other chest pain: Secondary | ICD-10-CM | POA: Diagnosis not present

## 2014-05-16 DIAGNOSIS — N400 Enlarged prostate without lower urinary tract symptoms: Secondary | ICD-10-CM | POA: Diagnosis not present

## 2014-05-16 DIAGNOSIS — R55 Syncope and collapse: Secondary | ICD-10-CM | POA: Diagnosis not present

## 2014-05-16 DIAGNOSIS — F341 Dysthymic disorder: Secondary | ICD-10-CM | POA: Diagnosis not present

## 2014-05-16 DIAGNOSIS — I951 Orthostatic hypotension: Secondary | ICD-10-CM | POA: Diagnosis not present

## 2014-05-16 DIAGNOSIS — D539 Nutritional anemia, unspecified: Secondary | ICD-10-CM | POA: Diagnosis not present

## 2014-05-25 DIAGNOSIS — M76899 Other specified enthesopathies of unspecified lower limb, excluding foot: Secondary | ICD-10-CM | POA: Diagnosis not present

## 2014-05-30 DIAGNOSIS — G5790 Unspecified mononeuropathy of unspecified lower limb: Secondary | ICD-10-CM | POA: Diagnosis not present

## 2014-05-30 DIAGNOSIS — G608 Other hereditary and idiopathic neuropathies: Secondary | ICD-10-CM | POA: Diagnosis not present

## 2014-05-30 DIAGNOSIS — M79609 Pain in unspecified limb: Secondary | ICD-10-CM | POA: Diagnosis not present

## 2014-06-12 DIAGNOSIS — I739 Peripheral vascular disease, unspecified: Secondary | ICD-10-CM | POA: Diagnosis not present

## 2014-06-12 DIAGNOSIS — Z23 Encounter for immunization: Secondary | ICD-10-CM | POA: Diagnosis not present

## 2014-06-16 DIAGNOSIS — R32 Unspecified urinary incontinence: Secondary | ICD-10-CM | POA: Diagnosis not present

## 2014-06-16 DIAGNOSIS — M109 Gout, unspecified: Secondary | ICD-10-CM | POA: Diagnosis not present

## 2014-06-16 DIAGNOSIS — J441 Chronic obstructive pulmonary disease with (acute) exacerbation: Secondary | ICD-10-CM | POA: Diagnosis not present

## 2014-06-26 DIAGNOSIS — G5790 Unspecified mononeuropathy of unspecified lower limb: Secondary | ICD-10-CM | POA: Diagnosis not present

## 2014-06-26 DIAGNOSIS — M79671 Pain in right foot: Secondary | ICD-10-CM | POA: Diagnosis not present

## 2014-06-26 DIAGNOSIS — G600 Hereditary motor and sensory neuropathy: Secondary | ICD-10-CM | POA: Diagnosis not present

## 2014-06-26 DIAGNOSIS — M79672 Pain in left foot: Secondary | ICD-10-CM | POA: Diagnosis not present

## 2014-07-26 DIAGNOSIS — J209 Acute bronchitis, unspecified: Secondary | ICD-10-CM | POA: Diagnosis not present

## 2014-08-08 DIAGNOSIS — M79672 Pain in left foot: Secondary | ICD-10-CM | POA: Diagnosis not present

## 2014-08-08 DIAGNOSIS — G600 Hereditary motor and sensory neuropathy: Secondary | ICD-10-CM | POA: Diagnosis not present

## 2014-08-08 DIAGNOSIS — M79671 Pain in right foot: Secondary | ICD-10-CM | POA: Diagnosis not present

## 2014-08-08 DIAGNOSIS — G5790 Unspecified mononeuropathy of unspecified lower limb: Secondary | ICD-10-CM | POA: Diagnosis not present

## 2014-08-15 DIAGNOSIS — J441 Chronic obstructive pulmonary disease with (acute) exacerbation: Secondary | ICD-10-CM | POA: Diagnosis not present

## 2014-08-15 DIAGNOSIS — R32 Unspecified urinary incontinence: Secondary | ICD-10-CM | POA: Diagnosis not present

## 2014-08-15 DIAGNOSIS — M109 Gout, unspecified: Secondary | ICD-10-CM | POA: Diagnosis not present

## 2014-08-21 DIAGNOSIS — M79672 Pain in left foot: Secondary | ICD-10-CM | POA: Diagnosis not present

## 2014-08-21 DIAGNOSIS — M79671 Pain in right foot: Secondary | ICD-10-CM | POA: Diagnosis not present

## 2014-08-21 DIAGNOSIS — G5790 Unspecified mononeuropathy of unspecified lower limb: Secondary | ICD-10-CM | POA: Diagnosis not present

## 2014-08-21 DIAGNOSIS — G600 Hereditary motor and sensory neuropathy: Secondary | ICD-10-CM | POA: Diagnosis not present

## 2014-09-05 DIAGNOSIS — M79671 Pain in right foot: Secondary | ICD-10-CM | POA: Diagnosis not present

## 2014-09-05 DIAGNOSIS — M25579 Pain in unspecified ankle and joints of unspecified foot: Secondary | ICD-10-CM | POA: Diagnosis not present

## 2014-09-05 DIAGNOSIS — G5790 Unspecified mononeuropathy of unspecified lower limb: Secondary | ICD-10-CM | POA: Diagnosis not present

## 2014-09-05 DIAGNOSIS — G600 Hereditary motor and sensory neuropathy: Secondary | ICD-10-CM | POA: Diagnosis not present

## 2014-09-26 DIAGNOSIS — I739 Peripheral vascular disease, unspecified: Secondary | ICD-10-CM | POA: Diagnosis not present

## 2014-10-05 DIAGNOSIS — M792 Neuralgia and neuritis, unspecified: Secondary | ICD-10-CM | POA: Diagnosis not present

## 2014-10-05 DIAGNOSIS — M79671 Pain in right foot: Secondary | ICD-10-CM | POA: Diagnosis not present

## 2014-10-05 DIAGNOSIS — E1140 Type 2 diabetes mellitus with diabetic neuropathy, unspecified: Secondary | ICD-10-CM | POA: Diagnosis not present

## 2014-10-05 DIAGNOSIS — M79672 Pain in left foot: Secondary | ICD-10-CM | POA: Diagnosis not present

## 2014-10-10 DIAGNOSIS — G5790 Unspecified mononeuropathy of unspecified lower limb: Secondary | ICD-10-CM | POA: Diagnosis not present

## 2014-10-10 DIAGNOSIS — M79671 Pain in right foot: Secondary | ICD-10-CM | POA: Diagnosis not present

## 2014-10-10 DIAGNOSIS — E1140 Type 2 diabetes mellitus with diabetic neuropathy, unspecified: Secondary | ICD-10-CM | POA: Diagnosis not present

## 2014-10-10 DIAGNOSIS — M792 Neuralgia and neuritis, unspecified: Secondary | ICD-10-CM | POA: Diagnosis not present

## 2014-10-12 DIAGNOSIS — M792 Neuralgia and neuritis, unspecified: Secondary | ICD-10-CM | POA: Diagnosis not present

## 2014-10-12 DIAGNOSIS — M79671 Pain in right foot: Secondary | ICD-10-CM | POA: Diagnosis not present

## 2014-10-12 DIAGNOSIS — G5790 Unspecified mononeuropathy of unspecified lower limb: Secondary | ICD-10-CM | POA: Diagnosis not present

## 2014-10-12 DIAGNOSIS — E1140 Type 2 diabetes mellitus with diabetic neuropathy, unspecified: Secondary | ICD-10-CM | POA: Diagnosis not present

## 2014-10-14 DIAGNOSIS — M109 Gout, unspecified: Secondary | ICD-10-CM | POA: Diagnosis not present

## 2014-10-14 DIAGNOSIS — R32 Unspecified urinary incontinence: Secondary | ICD-10-CM | POA: Diagnosis not present

## 2014-10-14 DIAGNOSIS — J441 Chronic obstructive pulmonary disease with (acute) exacerbation: Secondary | ICD-10-CM | POA: Diagnosis not present

## 2014-10-18 DIAGNOSIS — I739 Peripheral vascular disease, unspecified: Secondary | ICD-10-CM | POA: Diagnosis not present

## 2014-10-18 DIAGNOSIS — M792 Neuralgia and neuritis, unspecified: Secondary | ICD-10-CM | POA: Diagnosis not present

## 2014-10-18 DIAGNOSIS — M79672 Pain in left foot: Secondary | ICD-10-CM | POA: Diagnosis not present

## 2014-10-18 DIAGNOSIS — M79671 Pain in right foot: Secondary | ICD-10-CM | POA: Diagnosis not present

## 2014-10-24 DIAGNOSIS — G5790 Unspecified mononeuropathy of unspecified lower limb: Secondary | ICD-10-CM | POA: Diagnosis not present

## 2014-10-24 DIAGNOSIS — M792 Neuralgia and neuritis, unspecified: Secondary | ICD-10-CM | POA: Diagnosis not present

## 2014-10-24 DIAGNOSIS — G600 Hereditary motor and sensory neuropathy: Secondary | ICD-10-CM | POA: Diagnosis not present

## 2014-10-24 DIAGNOSIS — M79671 Pain in right foot: Secondary | ICD-10-CM | POA: Diagnosis not present

## 2014-10-26 DIAGNOSIS — G600 Hereditary motor and sensory neuropathy: Secondary | ICD-10-CM | POA: Diagnosis not present

## 2014-10-26 DIAGNOSIS — G5790 Unspecified mononeuropathy of unspecified lower limb: Secondary | ICD-10-CM | POA: Diagnosis not present

## 2014-10-26 DIAGNOSIS — M792 Neuralgia and neuritis, unspecified: Secondary | ICD-10-CM | POA: Diagnosis not present

## 2014-10-26 DIAGNOSIS — M79671 Pain in right foot: Secondary | ICD-10-CM | POA: Diagnosis not present

## 2014-10-31 DIAGNOSIS — Z7901 Long term (current) use of anticoagulants: Secondary | ICD-10-CM | POA: Diagnosis not present

## 2014-10-31 DIAGNOSIS — Z131 Encounter for screening for diabetes mellitus: Secondary | ICD-10-CM | POA: Diagnosis not present

## 2014-10-31 DIAGNOSIS — Z0000 Encounter for general adult medical examination without abnormal findings: Secondary | ICD-10-CM | POA: Diagnosis not present

## 2014-10-31 DIAGNOSIS — Z13220 Encounter for screening for lipoid disorders: Secondary | ICD-10-CM | POA: Diagnosis not present

## 2014-10-31 DIAGNOSIS — J018 Other acute sinusitis: Secondary | ICD-10-CM | POA: Diagnosis not present

## 2014-11-06 DIAGNOSIS — M79671 Pain in right foot: Secondary | ICD-10-CM | POA: Diagnosis not present

## 2014-11-06 DIAGNOSIS — M792 Neuralgia and neuritis, unspecified: Secondary | ICD-10-CM | POA: Diagnosis not present

## 2014-11-06 DIAGNOSIS — E1140 Type 2 diabetes mellitus with diabetic neuropathy, unspecified: Secondary | ICD-10-CM | POA: Diagnosis not present

## 2014-11-06 DIAGNOSIS — M79672 Pain in left foot: Secondary | ICD-10-CM | POA: Diagnosis not present

## 2014-11-09 DIAGNOSIS — M79671 Pain in right foot: Secondary | ICD-10-CM | POA: Diagnosis not present

## 2014-11-09 DIAGNOSIS — G5790 Unspecified mononeuropathy of unspecified lower limb: Secondary | ICD-10-CM | POA: Diagnosis not present

## 2014-11-09 DIAGNOSIS — G600 Hereditary motor and sensory neuropathy: Secondary | ICD-10-CM | POA: Diagnosis not present

## 2014-11-09 DIAGNOSIS — M792 Neuralgia and neuritis, unspecified: Secondary | ICD-10-CM | POA: Diagnosis not present

## 2014-11-14 DIAGNOSIS — G600 Hereditary motor and sensory neuropathy: Secondary | ICD-10-CM | POA: Diagnosis not present

## 2014-11-14 DIAGNOSIS — M79671 Pain in right foot: Secondary | ICD-10-CM | POA: Diagnosis not present

## 2014-11-14 DIAGNOSIS — M792 Neuralgia and neuritis, unspecified: Secondary | ICD-10-CM | POA: Diagnosis not present

## 2014-11-14 DIAGNOSIS — G5790 Unspecified mononeuropathy of unspecified lower limb: Secondary | ICD-10-CM | POA: Diagnosis not present

## 2014-11-16 DIAGNOSIS — G600 Hereditary motor and sensory neuropathy: Secondary | ICD-10-CM | POA: Diagnosis not present

## 2014-11-16 DIAGNOSIS — M792 Neuralgia and neuritis, unspecified: Secondary | ICD-10-CM | POA: Diagnosis not present

## 2014-11-16 DIAGNOSIS — M79671 Pain in right foot: Secondary | ICD-10-CM | POA: Diagnosis not present

## 2014-11-16 DIAGNOSIS — G5790 Unspecified mononeuropathy of unspecified lower limb: Secondary | ICD-10-CM | POA: Diagnosis not present

## 2014-11-23 DIAGNOSIS — G5790 Unspecified mononeuropathy of unspecified lower limb: Secondary | ICD-10-CM | POA: Diagnosis not present

## 2014-11-23 DIAGNOSIS — G600 Hereditary motor and sensory neuropathy: Secondary | ICD-10-CM | POA: Diagnosis not present

## 2014-11-23 DIAGNOSIS — M792 Neuralgia and neuritis, unspecified: Secondary | ICD-10-CM | POA: Diagnosis not present

## 2014-11-23 DIAGNOSIS — M79671 Pain in right foot: Secondary | ICD-10-CM | POA: Diagnosis not present

## 2014-11-29 DIAGNOSIS — E1140 Type 2 diabetes mellitus with diabetic neuropathy, unspecified: Secondary | ICD-10-CM | POA: Diagnosis not present

## 2014-11-29 DIAGNOSIS — B351 Tinea unguium: Secondary | ICD-10-CM | POA: Diagnosis not present

## 2014-11-30 DIAGNOSIS — G600 Hereditary motor and sensory neuropathy: Secondary | ICD-10-CM | POA: Diagnosis not present

## 2014-11-30 DIAGNOSIS — G5790 Unspecified mononeuropathy of unspecified lower limb: Secondary | ICD-10-CM | POA: Diagnosis not present

## 2014-11-30 DIAGNOSIS — M79671 Pain in right foot: Secondary | ICD-10-CM | POA: Diagnosis not present

## 2014-11-30 DIAGNOSIS — M792 Neuralgia and neuritis, unspecified: Secondary | ICD-10-CM | POA: Diagnosis not present

## 2014-12-04 DIAGNOSIS — M79671 Pain in right foot: Secondary | ICD-10-CM | POA: Diagnosis not present

## 2014-12-04 DIAGNOSIS — M792 Neuralgia and neuritis, unspecified: Secondary | ICD-10-CM | POA: Diagnosis not present

## 2014-12-04 DIAGNOSIS — G600 Hereditary motor and sensory neuropathy: Secondary | ICD-10-CM | POA: Diagnosis not present

## 2014-12-04 DIAGNOSIS — G5790 Unspecified mononeuropathy of unspecified lower limb: Secondary | ICD-10-CM | POA: Diagnosis not present

## 2014-12-13 DIAGNOSIS — M109 Gout, unspecified: Secondary | ICD-10-CM | POA: Diagnosis not present

## 2014-12-13 DIAGNOSIS — J441 Chronic obstructive pulmonary disease with (acute) exacerbation: Secondary | ICD-10-CM | POA: Diagnosis not present

## 2014-12-13 DIAGNOSIS — R32 Unspecified urinary incontinence: Secondary | ICD-10-CM | POA: Diagnosis not present

## 2014-12-19 DIAGNOSIS — M792 Neuralgia and neuritis, unspecified: Secondary | ICD-10-CM | POA: Diagnosis not present

## 2014-12-19 DIAGNOSIS — M79671 Pain in right foot: Secondary | ICD-10-CM | POA: Diagnosis not present

## 2014-12-19 DIAGNOSIS — G600 Hereditary motor and sensory neuropathy: Secondary | ICD-10-CM | POA: Diagnosis not present

## 2014-12-19 DIAGNOSIS — G5790 Unspecified mononeuropathy of unspecified lower limb: Secondary | ICD-10-CM | POA: Diagnosis not present

## 2014-12-21 DIAGNOSIS — M792 Neuralgia and neuritis, unspecified: Secondary | ICD-10-CM | POA: Diagnosis not present

## 2014-12-21 DIAGNOSIS — G600 Hereditary motor and sensory neuropathy: Secondary | ICD-10-CM | POA: Diagnosis not present

## 2014-12-21 DIAGNOSIS — G5790 Unspecified mononeuropathy of unspecified lower limb: Secondary | ICD-10-CM | POA: Diagnosis not present

## 2014-12-21 DIAGNOSIS — M79671 Pain in right foot: Secondary | ICD-10-CM | POA: Diagnosis not present

## 2014-12-22 DIAGNOSIS — Z961 Presence of intraocular lens: Secondary | ICD-10-CM | POA: Diagnosis not present

## 2014-12-25 DIAGNOSIS — G5790 Unspecified mononeuropathy of unspecified lower limb: Secondary | ICD-10-CM | POA: Diagnosis not present

## 2014-12-25 DIAGNOSIS — M792 Neuralgia and neuritis, unspecified: Secondary | ICD-10-CM | POA: Diagnosis not present

## 2014-12-25 DIAGNOSIS — G600 Hereditary motor and sensory neuropathy: Secondary | ICD-10-CM | POA: Diagnosis not present

## 2014-12-25 DIAGNOSIS — M79671 Pain in right foot: Secondary | ICD-10-CM | POA: Diagnosis not present

## 2014-12-27 DIAGNOSIS — G5790 Unspecified mononeuropathy of unspecified lower limb: Secondary | ICD-10-CM | POA: Diagnosis not present

## 2014-12-27 DIAGNOSIS — G600 Hereditary motor and sensory neuropathy: Secondary | ICD-10-CM | POA: Diagnosis not present

## 2014-12-27 DIAGNOSIS — M79671 Pain in right foot: Secondary | ICD-10-CM | POA: Diagnosis not present

## 2014-12-27 DIAGNOSIS — M792 Neuralgia and neuritis, unspecified: Secondary | ICD-10-CM | POA: Diagnosis not present

## 2015-01-09 DIAGNOSIS — M79671 Pain in right foot: Secondary | ICD-10-CM | POA: Diagnosis not present

## 2015-01-09 DIAGNOSIS — M792 Neuralgia and neuritis, unspecified: Secondary | ICD-10-CM | POA: Diagnosis not present

## 2015-01-09 DIAGNOSIS — G600 Hereditary motor and sensory neuropathy: Secondary | ICD-10-CM | POA: Diagnosis not present

## 2015-01-09 DIAGNOSIS — G5790 Unspecified mononeuropathy of unspecified lower limb: Secondary | ICD-10-CM | POA: Diagnosis not present

## 2015-01-27 DIAGNOSIS — R531 Weakness: Secondary | ICD-10-CM | POA: Diagnosis not present

## 2015-01-27 DIAGNOSIS — F419 Anxiety disorder, unspecified: Secondary | ICD-10-CM | POA: Diagnosis not present

## 2015-01-27 DIAGNOSIS — J449 Chronic obstructive pulmonary disease, unspecified: Secondary | ICD-10-CM | POA: Diagnosis not present

## 2015-01-27 DIAGNOSIS — R51 Headache: Secondary | ICD-10-CM | POA: Diagnosis not present

## 2015-01-27 DIAGNOSIS — R404 Transient alteration of awareness: Secondary | ICD-10-CM | POA: Diagnosis not present

## 2015-01-27 DIAGNOSIS — N390 Urinary tract infection, site not specified: Secondary | ICD-10-CM | POA: Diagnosis not present

## 2015-01-27 DIAGNOSIS — R270 Ataxia, unspecified: Secondary | ICD-10-CM | POA: Diagnosis not present

## 2015-01-27 DIAGNOSIS — F17200 Nicotine dependence, unspecified, uncomplicated: Secondary | ICD-10-CM | POA: Diagnosis not present

## 2015-01-27 DIAGNOSIS — R42 Dizziness and giddiness: Secondary | ICD-10-CM | POA: Diagnosis not present

## 2015-01-27 DIAGNOSIS — M545 Low back pain: Secondary | ICD-10-CM | POA: Diagnosis not present

## 2015-01-27 DIAGNOSIS — R55 Syncope and collapse: Secondary | ICD-10-CM | POA: Diagnosis not present

## 2015-01-27 DIAGNOSIS — F329 Major depressive disorder, single episode, unspecified: Secondary | ICD-10-CM | POA: Diagnosis not present

## 2015-01-27 DIAGNOSIS — N400 Enlarged prostate without lower urinary tract symptoms: Secondary | ICD-10-CM | POA: Diagnosis not present

## 2015-01-27 DIAGNOSIS — K219 Gastro-esophageal reflux disease without esophagitis: Secondary | ICD-10-CM | POA: Diagnosis not present

## 2015-01-27 DIAGNOSIS — D649 Anemia, unspecified: Secondary | ICD-10-CM | POA: Diagnosis not present

## 2015-01-27 DIAGNOSIS — G8929 Other chronic pain: Secondary | ICD-10-CM | POA: Diagnosis not present

## 2015-01-27 DIAGNOSIS — R05 Cough: Secondary | ICD-10-CM | POA: Diagnosis not present

## 2015-01-28 DIAGNOSIS — J449 Chronic obstructive pulmonary disease, unspecified: Secondary | ICD-10-CM | POA: Diagnosis not present

## 2015-01-28 DIAGNOSIS — K219 Gastro-esophageal reflux disease without esophagitis: Secondary | ICD-10-CM | POA: Diagnosis not present

## 2015-01-28 DIAGNOSIS — M545 Low back pain: Secondary | ICD-10-CM | POA: Diagnosis not present

## 2015-01-28 DIAGNOSIS — R55 Syncope and collapse: Secondary | ICD-10-CM | POA: Diagnosis not present

## 2015-01-28 DIAGNOSIS — N390 Urinary tract infection, site not specified: Secondary | ICD-10-CM | POA: Diagnosis not present

## 2015-01-28 DIAGNOSIS — D649 Anemia, unspecified: Secondary | ICD-10-CM | POA: Diagnosis not present

## 2015-01-29 DIAGNOSIS — N390 Urinary tract infection, site not specified: Secondary | ICD-10-CM | POA: Diagnosis not present

## 2015-01-29 DIAGNOSIS — D649 Anemia, unspecified: Secondary | ICD-10-CM | POA: Diagnosis not present

## 2015-01-29 DIAGNOSIS — R55 Syncope and collapse: Secondary | ICD-10-CM | POA: Diagnosis not present

## 2015-01-29 DIAGNOSIS — K219 Gastro-esophageal reflux disease without esophagitis: Secondary | ICD-10-CM | POA: Diagnosis not present

## 2015-01-29 DIAGNOSIS — J449 Chronic obstructive pulmonary disease, unspecified: Secondary | ICD-10-CM | POA: Diagnosis not present

## 2015-01-29 DIAGNOSIS — M545 Low back pain: Secondary | ICD-10-CM | POA: Diagnosis not present

## 2015-02-02 DIAGNOSIS — R55 Syncope and collapse: Secondary | ICD-10-CM | POA: Diagnosis not present

## 2015-02-06 DIAGNOSIS — M79672 Pain in left foot: Secondary | ICD-10-CM | POA: Diagnosis not present

## 2015-02-06 DIAGNOSIS — I739 Peripheral vascular disease, unspecified: Secondary | ICD-10-CM | POA: Diagnosis not present

## 2015-02-06 DIAGNOSIS — M79671 Pain in right foot: Secondary | ICD-10-CM | POA: Diagnosis not present

## 2015-02-11 DIAGNOSIS — J441 Chronic obstructive pulmonary disease with (acute) exacerbation: Secondary | ICD-10-CM | POA: Diagnosis not present

## 2015-02-11 DIAGNOSIS — R32 Unspecified urinary incontinence: Secondary | ICD-10-CM | POA: Diagnosis not present

## 2015-02-11 DIAGNOSIS — M109 Gout, unspecified: Secondary | ICD-10-CM | POA: Diagnosis not present

## 2015-02-22 DIAGNOSIS — M79671 Pain in right foot: Secondary | ICD-10-CM | POA: Diagnosis not present

## 2015-02-22 DIAGNOSIS — M792 Neuralgia and neuritis, unspecified: Secondary | ICD-10-CM | POA: Diagnosis not present

## 2015-02-22 DIAGNOSIS — G600 Hereditary motor and sensory neuropathy: Secondary | ICD-10-CM | POA: Diagnosis not present

## 2015-02-22 DIAGNOSIS — G5790 Unspecified mononeuropathy of unspecified lower limb: Secondary | ICD-10-CM | POA: Diagnosis not present

## 2015-03-01 DIAGNOSIS — M792 Neuralgia and neuritis, unspecified: Secondary | ICD-10-CM | POA: Diagnosis not present

## 2015-03-01 DIAGNOSIS — G5790 Unspecified mononeuropathy of unspecified lower limb: Secondary | ICD-10-CM | POA: Diagnosis not present

## 2015-03-01 DIAGNOSIS — G600 Hereditary motor and sensory neuropathy: Secondary | ICD-10-CM | POA: Diagnosis not present

## 2015-03-01 DIAGNOSIS — M79671 Pain in right foot: Secondary | ICD-10-CM | POA: Diagnosis not present

## 2015-03-15 DIAGNOSIS — M792 Neuralgia and neuritis, unspecified: Secondary | ICD-10-CM | POA: Diagnosis not present

## 2015-03-15 DIAGNOSIS — M79671 Pain in right foot: Secondary | ICD-10-CM | POA: Diagnosis not present

## 2015-03-15 DIAGNOSIS — G5790 Unspecified mononeuropathy of unspecified lower limb: Secondary | ICD-10-CM | POA: Diagnosis not present

## 2015-03-15 DIAGNOSIS — G600 Hereditary motor and sensory neuropathy: Secondary | ICD-10-CM | POA: Diagnosis not present

## 2015-04-12 DIAGNOSIS — J441 Chronic obstructive pulmonary disease with (acute) exacerbation: Secondary | ICD-10-CM | POA: Diagnosis not present

## 2015-04-12 DIAGNOSIS — R32 Unspecified urinary incontinence: Secondary | ICD-10-CM | POA: Diagnosis not present

## 2015-04-12 DIAGNOSIS — M109 Gout, unspecified: Secondary | ICD-10-CM | POA: Diagnosis not present

## 2015-04-16 DIAGNOSIS — G5790 Unspecified mononeuropathy of unspecified lower limb: Secondary | ICD-10-CM | POA: Diagnosis not present

## 2015-04-16 DIAGNOSIS — M79671 Pain in right foot: Secondary | ICD-10-CM | POA: Diagnosis not present

## 2015-04-16 DIAGNOSIS — M792 Neuralgia and neuritis, unspecified: Secondary | ICD-10-CM | POA: Diagnosis not present

## 2015-04-16 DIAGNOSIS — G600 Hereditary motor and sensory neuropathy: Secondary | ICD-10-CM | POA: Diagnosis not present

## 2015-05-01 DIAGNOSIS — G600 Hereditary motor and sensory neuropathy: Secondary | ICD-10-CM | POA: Diagnosis not present

## 2015-05-01 DIAGNOSIS — G5790 Unspecified mononeuropathy of unspecified lower limb: Secondary | ICD-10-CM | POA: Diagnosis not present

## 2015-05-01 DIAGNOSIS — M792 Neuralgia and neuritis, unspecified: Secondary | ICD-10-CM | POA: Diagnosis not present

## 2015-05-01 DIAGNOSIS — M79671 Pain in right foot: Secondary | ICD-10-CM | POA: Diagnosis not present

## 2015-05-04 DIAGNOSIS — M109 Gout, unspecified: Secondary | ICD-10-CM | POA: Diagnosis not present

## 2015-05-04 DIAGNOSIS — N4289 Other specified disorders of prostate: Secondary | ICD-10-CM | POA: Diagnosis not present

## 2015-05-04 DIAGNOSIS — M545 Low back pain: Secondary | ICD-10-CM | POA: Diagnosis not present

## 2015-05-04 DIAGNOSIS — M25511 Pain in right shoulder: Secondary | ICD-10-CM | POA: Diagnosis not present

## 2015-05-04 DIAGNOSIS — I10 Essential (primary) hypertension: Secondary | ICD-10-CM | POA: Diagnosis not present

## 2015-05-04 DIAGNOSIS — E784 Other hyperlipidemia: Secondary | ICD-10-CM | POA: Diagnosis not present

## 2015-05-29 DIAGNOSIS — M792 Neuralgia and neuritis, unspecified: Secondary | ICD-10-CM | POA: Diagnosis not present

## 2015-05-29 DIAGNOSIS — M79671 Pain in right foot: Secondary | ICD-10-CM | POA: Diagnosis not present

## 2015-05-29 DIAGNOSIS — G600 Hereditary motor and sensory neuropathy: Secondary | ICD-10-CM | POA: Diagnosis not present

## 2015-05-29 DIAGNOSIS — G5790 Unspecified mononeuropathy of unspecified lower limb: Secondary | ICD-10-CM | POA: Diagnosis not present

## 2015-06-11 DIAGNOSIS — N39498 Other specified urinary incontinence: Secondary | ICD-10-CM | POA: Diagnosis not present

## 2015-06-11 DIAGNOSIS — J441 Chronic obstructive pulmonary disease with (acute) exacerbation: Secondary | ICD-10-CM | POA: Diagnosis not present

## 2015-06-11 DIAGNOSIS — M1049 Other secondary gout, multiple sites: Secondary | ICD-10-CM | POA: Diagnosis not present

## 2015-06-25 DIAGNOSIS — M79671 Pain in right foot: Secondary | ICD-10-CM | POA: Diagnosis not present

## 2015-06-25 DIAGNOSIS — G5790 Unspecified mononeuropathy of unspecified lower limb: Secondary | ICD-10-CM | POA: Diagnosis not present

## 2015-06-25 DIAGNOSIS — G600 Hereditary motor and sensory neuropathy: Secondary | ICD-10-CM | POA: Diagnosis not present

## 2015-06-25 DIAGNOSIS — M792 Neuralgia and neuritis, unspecified: Secondary | ICD-10-CM | POA: Diagnosis not present

## 2015-07-02 DIAGNOSIS — G5790 Unspecified mononeuropathy of unspecified lower limb: Secondary | ICD-10-CM | POA: Diagnosis not present

## 2015-07-02 DIAGNOSIS — G600 Hereditary motor and sensory neuropathy: Secondary | ICD-10-CM | POA: Diagnosis not present

## 2015-07-02 DIAGNOSIS — I739 Peripheral vascular disease, unspecified: Secondary | ICD-10-CM | POA: Diagnosis not present

## 2015-07-02 DIAGNOSIS — M79671 Pain in right foot: Secondary | ICD-10-CM | POA: Diagnosis not present

## 2015-07-11 DIAGNOSIS — Z23 Encounter for immunization: Secondary | ICD-10-CM | POA: Diagnosis not present

## 2015-07-23 DIAGNOSIS — G600 Hereditary motor and sensory neuropathy: Secondary | ICD-10-CM | POA: Diagnosis not present

## 2015-07-23 DIAGNOSIS — M79671 Pain in right foot: Secondary | ICD-10-CM | POA: Diagnosis not present

## 2015-07-23 DIAGNOSIS — M792 Neuralgia and neuritis, unspecified: Secondary | ICD-10-CM | POA: Diagnosis not present

## 2015-07-23 DIAGNOSIS — G5790 Unspecified mononeuropathy of unspecified lower limb: Secondary | ICD-10-CM | POA: Diagnosis not present

## 2015-08-06 DIAGNOSIS — M1049 Other secondary gout, multiple sites: Secondary | ICD-10-CM | POA: Diagnosis not present

## 2015-08-06 DIAGNOSIS — K210 Gastro-esophageal reflux disease with esophagitis: Secondary | ICD-10-CM | POA: Diagnosis not present

## 2015-08-06 DIAGNOSIS — E784 Other hyperlipidemia: Secondary | ICD-10-CM | POA: Diagnosis not present

## 2015-08-06 DIAGNOSIS — I10 Essential (primary) hypertension: Secondary | ICD-10-CM | POA: Diagnosis not present

## 2015-08-10 DIAGNOSIS — N39498 Other specified urinary incontinence: Secondary | ICD-10-CM | POA: Diagnosis not present

## 2015-08-10 DIAGNOSIS — J441 Chronic obstructive pulmonary disease with (acute) exacerbation: Secondary | ICD-10-CM | POA: Diagnosis not present

## 2015-08-10 DIAGNOSIS — M1049 Other secondary gout, multiple sites: Secondary | ICD-10-CM | POA: Diagnosis not present

## 2015-08-20 DIAGNOSIS — M792 Neuralgia and neuritis, unspecified: Secondary | ICD-10-CM | POA: Diagnosis not present

## 2015-08-20 DIAGNOSIS — G600 Hereditary motor and sensory neuropathy: Secondary | ICD-10-CM | POA: Diagnosis not present

## 2015-08-20 DIAGNOSIS — M79671 Pain in right foot: Secondary | ICD-10-CM | POA: Diagnosis not present

## 2015-08-20 DIAGNOSIS — G5790 Unspecified mononeuropathy of unspecified lower limb: Secondary | ICD-10-CM | POA: Diagnosis not present

## 2015-09-17 DIAGNOSIS — G600 Hereditary motor and sensory neuropathy: Secondary | ICD-10-CM | POA: Diagnosis not present

## 2015-09-17 DIAGNOSIS — M792 Neuralgia and neuritis, unspecified: Secondary | ICD-10-CM | POA: Diagnosis not present

## 2015-09-17 DIAGNOSIS — M79671 Pain in right foot: Secondary | ICD-10-CM | POA: Diagnosis not present

## 2015-09-17 DIAGNOSIS — G5790 Unspecified mononeuropathy of unspecified lower limb: Secondary | ICD-10-CM | POA: Diagnosis not present

## 2015-10-08 DIAGNOSIS — G600 Hereditary motor and sensory neuropathy: Secondary | ICD-10-CM | POA: Diagnosis not present

## 2015-10-08 DIAGNOSIS — G5790 Unspecified mononeuropathy of unspecified lower limb: Secondary | ICD-10-CM | POA: Diagnosis not present

## 2015-10-08 DIAGNOSIS — M792 Neuralgia and neuritis, unspecified: Secondary | ICD-10-CM | POA: Diagnosis not present

## 2015-10-08 DIAGNOSIS — M79671 Pain in right foot: Secondary | ICD-10-CM | POA: Diagnosis not present

## 2015-10-09 DIAGNOSIS — J441 Chronic obstructive pulmonary disease with (acute) exacerbation: Secondary | ICD-10-CM | POA: Diagnosis not present

## 2015-10-09 DIAGNOSIS — N39498 Other specified urinary incontinence: Secondary | ICD-10-CM | POA: Diagnosis not present

## 2015-10-15 DIAGNOSIS — M792 Neuralgia and neuritis, unspecified: Secondary | ICD-10-CM | POA: Diagnosis not present

## 2015-10-15 DIAGNOSIS — G5790 Unspecified mononeuropathy of unspecified lower limb: Secondary | ICD-10-CM | POA: Diagnosis not present

## 2015-10-15 DIAGNOSIS — M79671 Pain in right foot: Secondary | ICD-10-CM | POA: Diagnosis not present

## 2015-10-15 DIAGNOSIS — G600 Hereditary motor and sensory neuropathy: Secondary | ICD-10-CM | POA: Diagnosis not present

## 2015-11-06 DIAGNOSIS — M1049 Other secondary gout, multiple sites: Secondary | ICD-10-CM | POA: Diagnosis not present

## 2015-11-06 DIAGNOSIS — Z1389 Encounter for screening for other disorder: Secondary | ICD-10-CM | POA: Diagnosis not present

## 2015-11-06 DIAGNOSIS — Z0000 Encounter for general adult medical examination without abnormal findings: Secondary | ICD-10-CM | POA: Diagnosis not present

## 2015-11-06 DIAGNOSIS — E784 Other hyperlipidemia: Secondary | ICD-10-CM | POA: Diagnosis not present

## 2015-11-06 DIAGNOSIS — I10 Essential (primary) hypertension: Secondary | ICD-10-CM | POA: Diagnosis not present

## 2015-11-06 DIAGNOSIS — K210 Gastro-esophageal reflux disease with esophagitis: Secondary | ICD-10-CM | POA: Diagnosis not present

## 2015-11-12 DIAGNOSIS — G5790 Unspecified mononeuropathy of unspecified lower limb: Secondary | ICD-10-CM | POA: Diagnosis not present

## 2015-11-12 DIAGNOSIS — G600 Hereditary motor and sensory neuropathy: Secondary | ICD-10-CM | POA: Diagnosis not present

## 2015-11-12 DIAGNOSIS — M792 Neuralgia and neuritis, unspecified: Secondary | ICD-10-CM | POA: Diagnosis not present

## 2015-11-12 DIAGNOSIS — M79671 Pain in right foot: Secondary | ICD-10-CM | POA: Diagnosis not present

## 2015-12-08 DIAGNOSIS — J441 Chronic obstructive pulmonary disease with (acute) exacerbation: Secondary | ICD-10-CM | POA: Diagnosis not present

## 2015-12-08 DIAGNOSIS — M1049 Other secondary gout, multiple sites: Secondary | ICD-10-CM | POA: Diagnosis not present

## 2015-12-08 DIAGNOSIS — N39498 Other specified urinary incontinence: Secondary | ICD-10-CM | POA: Diagnosis not present

## 2015-12-10 DIAGNOSIS — G5790 Unspecified mononeuropathy of unspecified lower limb: Secondary | ICD-10-CM | POA: Diagnosis not present

## 2015-12-10 DIAGNOSIS — M792 Neuralgia and neuritis, unspecified: Secondary | ICD-10-CM | POA: Diagnosis not present

## 2015-12-10 DIAGNOSIS — G600 Hereditary motor and sensory neuropathy: Secondary | ICD-10-CM | POA: Diagnosis not present

## 2015-12-10 DIAGNOSIS — M79671 Pain in right foot: Secondary | ICD-10-CM | POA: Diagnosis not present

## 2015-12-20 DIAGNOSIS — M792 Neuralgia and neuritis, unspecified: Secondary | ICD-10-CM | POA: Diagnosis not present

## 2015-12-20 DIAGNOSIS — M79671 Pain in right foot: Secondary | ICD-10-CM | POA: Diagnosis not present

## 2015-12-20 DIAGNOSIS — G5790 Unspecified mononeuropathy of unspecified lower limb: Secondary | ICD-10-CM | POA: Diagnosis not present

## 2015-12-20 DIAGNOSIS — G600 Hereditary motor and sensory neuropathy: Secondary | ICD-10-CM | POA: Diagnosis not present

## 2016-01-07 DIAGNOSIS — G5790 Unspecified mononeuropathy of unspecified lower limb: Secondary | ICD-10-CM | POA: Diagnosis not present

## 2016-01-07 DIAGNOSIS — M79671 Pain in right foot: Secondary | ICD-10-CM | POA: Diagnosis not present

## 2016-01-07 DIAGNOSIS — M792 Neuralgia and neuritis, unspecified: Secondary | ICD-10-CM | POA: Diagnosis not present

## 2016-01-07 DIAGNOSIS — G600 Hereditary motor and sensory neuropathy: Secondary | ICD-10-CM | POA: Diagnosis not present

## 2016-01-15 DIAGNOSIS — M79671 Pain in right foot: Secondary | ICD-10-CM | POA: Diagnosis not present

## 2016-01-15 DIAGNOSIS — G5790 Unspecified mononeuropathy of unspecified lower limb: Secondary | ICD-10-CM | POA: Diagnosis not present

## 2016-01-15 DIAGNOSIS — G600 Hereditary motor and sensory neuropathy: Secondary | ICD-10-CM | POA: Diagnosis not present

## 2016-01-15 DIAGNOSIS — I739 Peripheral vascular disease, unspecified: Secondary | ICD-10-CM | POA: Diagnosis not present

## 2016-02-04 DIAGNOSIS — M792 Neuralgia and neuritis, unspecified: Secondary | ICD-10-CM | POA: Diagnosis not present

## 2016-02-04 DIAGNOSIS — M79671 Pain in right foot: Secondary | ICD-10-CM | POA: Diagnosis not present

## 2016-02-04 DIAGNOSIS — G5790 Unspecified mononeuropathy of unspecified lower limb: Secondary | ICD-10-CM | POA: Diagnosis not present

## 2016-02-04 DIAGNOSIS — G600 Hereditary motor and sensory neuropathy: Secondary | ICD-10-CM | POA: Diagnosis not present

## 2016-02-06 DIAGNOSIS — N39498 Other specified urinary incontinence: Secondary | ICD-10-CM | POA: Diagnosis not present

## 2016-02-06 DIAGNOSIS — J441 Chronic obstructive pulmonary disease with (acute) exacerbation: Secondary | ICD-10-CM | POA: Diagnosis not present

## 2016-02-06 DIAGNOSIS — M1049 Other secondary gout, multiple sites: Secondary | ICD-10-CM | POA: Diagnosis not present

## 2016-02-11 DIAGNOSIS — E784 Other hyperlipidemia: Secondary | ICD-10-CM | POA: Diagnosis not present

## 2016-02-11 DIAGNOSIS — J440 Chronic obstructive pulmonary disease with acute lower respiratory infection: Secondary | ICD-10-CM | POA: Diagnosis not present

## 2016-02-11 DIAGNOSIS — M25512 Pain in left shoulder: Secondary | ICD-10-CM | POA: Diagnosis not present

## 2016-02-11 DIAGNOSIS — N3080 Other cystitis without hematuria: Secondary | ICD-10-CM | POA: Diagnosis not present

## 2016-02-11 DIAGNOSIS — K210 Gastro-esophageal reflux disease with esophagitis: Secondary | ICD-10-CM | POA: Diagnosis not present

## 2016-02-11 DIAGNOSIS — I10 Essential (primary) hypertension: Secondary | ICD-10-CM | POA: Diagnosis not present

## 2016-02-11 DIAGNOSIS — M1049 Other secondary gout, multiple sites: Secondary | ICD-10-CM | POA: Diagnosis not present

## 2016-02-15 DIAGNOSIS — J449 Chronic obstructive pulmonary disease, unspecified: Secondary | ICD-10-CM | POA: Diagnosis not present

## 2016-02-15 DIAGNOSIS — J440 Chronic obstructive pulmonary disease with acute lower respiratory infection: Secondary | ICD-10-CM | POA: Diagnosis not present

## 2016-03-17 DIAGNOSIS — M79671 Pain in right foot: Secondary | ICD-10-CM | POA: Diagnosis not present

## 2016-03-17 DIAGNOSIS — M792 Neuralgia and neuritis, unspecified: Secondary | ICD-10-CM | POA: Diagnosis not present

## 2016-03-17 DIAGNOSIS — G600 Hereditary motor and sensory neuropathy: Secondary | ICD-10-CM | POA: Diagnosis not present

## 2016-03-17 DIAGNOSIS — G5790 Unspecified mononeuropathy of unspecified lower limb: Secondary | ICD-10-CM | POA: Diagnosis not present

## 2016-04-01 DIAGNOSIS — M79671 Pain in right foot: Secondary | ICD-10-CM | POA: Diagnosis not present

## 2016-04-01 DIAGNOSIS — I739 Peripheral vascular disease, unspecified: Secondary | ICD-10-CM | POA: Diagnosis not present

## 2016-04-01 DIAGNOSIS — G600 Hereditary motor and sensory neuropathy: Secondary | ICD-10-CM | POA: Diagnosis not present

## 2016-04-01 DIAGNOSIS — G5790 Unspecified mononeuropathy of unspecified lower limb: Secondary | ICD-10-CM | POA: Diagnosis not present

## 2016-04-06 DIAGNOSIS — M1049 Other secondary gout, multiple sites: Secondary | ICD-10-CM | POA: Diagnosis not present

## 2016-04-06 DIAGNOSIS — N39498 Other specified urinary incontinence: Secondary | ICD-10-CM | POA: Diagnosis not present

## 2016-04-06 DIAGNOSIS — J441 Chronic obstructive pulmonary disease with (acute) exacerbation: Secondary | ICD-10-CM | POA: Diagnosis not present

## 2016-04-14 DIAGNOSIS — G5790 Unspecified mononeuropathy of unspecified lower limb: Secondary | ICD-10-CM | POA: Diagnosis not present

## 2016-04-14 DIAGNOSIS — G600 Hereditary motor and sensory neuropathy: Secondary | ICD-10-CM | POA: Diagnosis not present

## 2016-04-14 DIAGNOSIS — M792 Neuralgia and neuritis, unspecified: Secondary | ICD-10-CM | POA: Diagnosis not present

## 2016-04-14 DIAGNOSIS — M79671 Pain in right foot: Secondary | ICD-10-CM | POA: Diagnosis not present

## 2016-05-12 DIAGNOSIS — G600 Hereditary motor and sensory neuropathy: Secondary | ICD-10-CM | POA: Diagnosis not present

## 2016-05-12 DIAGNOSIS — G5790 Unspecified mononeuropathy of unspecified lower limb: Secondary | ICD-10-CM | POA: Diagnosis not present

## 2016-05-12 DIAGNOSIS — M79671 Pain in right foot: Secondary | ICD-10-CM | POA: Diagnosis not present

## 2016-05-12 DIAGNOSIS — M792 Neuralgia and neuritis, unspecified: Secondary | ICD-10-CM | POA: Diagnosis not present

## 2016-05-13 DIAGNOSIS — M1049 Other secondary gout, multiple sites: Secondary | ICD-10-CM | POA: Diagnosis not present

## 2016-05-13 DIAGNOSIS — K210 Gastro-esophageal reflux disease with esophagitis: Secondary | ICD-10-CM | POA: Diagnosis not present

## 2016-05-13 DIAGNOSIS — R5383 Other fatigue: Secondary | ICD-10-CM | POA: Diagnosis not present

## 2016-05-13 DIAGNOSIS — I10 Essential (primary) hypertension: Secondary | ICD-10-CM | POA: Diagnosis not present

## 2016-05-13 DIAGNOSIS — J440 Chronic obstructive pulmonary disease with acute lower respiratory infection: Secondary | ICD-10-CM | POA: Diagnosis not present

## 2016-05-13 DIAGNOSIS — E784 Other hyperlipidemia: Secondary | ICD-10-CM | POA: Diagnosis not present

## 2016-05-13 DIAGNOSIS — M25512 Pain in left shoulder: Secondary | ICD-10-CM | POA: Diagnosis not present

## 2016-06-03 DIAGNOSIS — H40013 Open angle with borderline findings, low risk, bilateral: Secondary | ICD-10-CM | POA: Diagnosis not present

## 2016-06-03 DIAGNOSIS — Z961 Presence of intraocular lens: Secondary | ICD-10-CM | POA: Diagnosis not present

## 2016-06-03 DIAGNOSIS — H53002 Unspecified amblyopia, left eye: Secondary | ICD-10-CM | POA: Diagnosis not present

## 2016-06-03 DIAGNOSIS — M79671 Pain in right foot: Secondary | ICD-10-CM | POA: Diagnosis not present

## 2016-06-03 DIAGNOSIS — G600 Hereditary motor and sensory neuropathy: Secondary | ICD-10-CM | POA: Diagnosis not present

## 2016-06-03 DIAGNOSIS — M792 Neuralgia and neuritis, unspecified: Secondary | ICD-10-CM | POA: Diagnosis not present

## 2016-06-03 DIAGNOSIS — H26491 Other secondary cataract, right eye: Secondary | ICD-10-CM | POA: Diagnosis not present

## 2016-06-03 DIAGNOSIS — Z9842 Cataract extraction status, left eye: Secondary | ICD-10-CM | POA: Diagnosis not present

## 2016-06-03 DIAGNOSIS — G5790 Unspecified mononeuropathy of unspecified lower limb: Secondary | ICD-10-CM | POA: Diagnosis not present

## 2016-06-05 DIAGNOSIS — J441 Chronic obstructive pulmonary disease with (acute) exacerbation: Secondary | ICD-10-CM | POA: Diagnosis not present

## 2016-06-05 DIAGNOSIS — M1049 Other secondary gout, multiple sites: Secondary | ICD-10-CM | POA: Diagnosis not present

## 2016-06-05 DIAGNOSIS — N39498 Other specified urinary incontinence: Secondary | ICD-10-CM | POA: Diagnosis not present

## 2016-06-10 DIAGNOSIS — G600 Hereditary motor and sensory neuropathy: Secondary | ICD-10-CM | POA: Diagnosis not present

## 2016-06-10 DIAGNOSIS — I739 Peripheral vascular disease, unspecified: Secondary | ICD-10-CM | POA: Diagnosis not present

## 2016-06-10 DIAGNOSIS — G5790 Unspecified mononeuropathy of unspecified lower limb: Secondary | ICD-10-CM | POA: Diagnosis not present

## 2016-06-10 DIAGNOSIS — M79671 Pain in right foot: Secondary | ICD-10-CM | POA: Diagnosis not present

## 2016-07-15 DIAGNOSIS — M792 Neuralgia and neuritis, unspecified: Secondary | ICD-10-CM | POA: Diagnosis not present

## 2016-07-15 DIAGNOSIS — G600 Hereditary motor and sensory neuropathy: Secondary | ICD-10-CM | POA: Diagnosis not present

## 2016-07-15 DIAGNOSIS — G5790 Unspecified mononeuropathy of unspecified lower limb: Secondary | ICD-10-CM | POA: Diagnosis not present

## 2016-07-15 DIAGNOSIS — Z23 Encounter for immunization: Secondary | ICD-10-CM | POA: Diagnosis not present

## 2016-07-15 DIAGNOSIS — M79671 Pain in right foot: Secondary | ICD-10-CM | POA: Diagnosis not present

## 2016-08-04 DIAGNOSIS — N39498 Other specified urinary incontinence: Secondary | ICD-10-CM | POA: Diagnosis not present

## 2016-08-04 DIAGNOSIS — M1009 Idiopathic gout, multiple sites: Secondary | ICD-10-CM | POA: Diagnosis not present

## 2016-08-04 DIAGNOSIS — J441 Chronic obstructive pulmonary disease with (acute) exacerbation: Secondary | ICD-10-CM | POA: Diagnosis not present

## 2016-08-11 DIAGNOSIS — M792 Neuralgia and neuritis, unspecified: Secondary | ICD-10-CM | POA: Diagnosis not present

## 2016-08-11 DIAGNOSIS — G5790 Unspecified mononeuropathy of unspecified lower limb: Secondary | ICD-10-CM | POA: Diagnosis not present

## 2016-08-11 DIAGNOSIS — G600 Hereditary motor and sensory neuropathy: Secondary | ICD-10-CM | POA: Diagnosis not present

## 2016-08-11 DIAGNOSIS — M79671 Pain in right foot: Secondary | ICD-10-CM | POA: Diagnosis not present

## 2016-08-12 DIAGNOSIS — I10 Essential (primary) hypertension: Secondary | ICD-10-CM | POA: Diagnosis not present

## 2016-08-12 DIAGNOSIS — K210 Gastro-esophageal reflux disease with esophagitis: Secondary | ICD-10-CM | POA: Diagnosis not present

## 2016-08-12 DIAGNOSIS — L570 Actinic keratosis: Secondary | ICD-10-CM | POA: Diagnosis not present

## 2016-08-12 DIAGNOSIS — J440 Chronic obstructive pulmonary disease with acute lower respiratory infection: Secondary | ICD-10-CM | POA: Diagnosis not present

## 2016-08-12 DIAGNOSIS — I9589 Other hypotension: Secondary | ICD-10-CM | POA: Diagnosis not present

## 2016-08-12 DIAGNOSIS — M1049 Other secondary gout, multiple sites: Secondary | ICD-10-CM | POA: Diagnosis not present

## 2016-08-12 DIAGNOSIS — E784 Other hyperlipidemia: Secondary | ICD-10-CM | POA: Diagnosis not present

## 2016-08-19 DIAGNOSIS — M79671 Pain in right foot: Secondary | ICD-10-CM | POA: Diagnosis not present

## 2016-08-19 DIAGNOSIS — G600 Hereditary motor and sensory neuropathy: Secondary | ICD-10-CM | POA: Diagnosis not present

## 2016-08-19 DIAGNOSIS — I739 Peripheral vascular disease, unspecified: Secondary | ICD-10-CM | POA: Diagnosis not present

## 2016-08-19 DIAGNOSIS — G5790 Unspecified mononeuropathy of unspecified lower limb: Secondary | ICD-10-CM | POA: Diagnosis not present

## 2016-09-08 DIAGNOSIS — G600 Hereditary motor and sensory neuropathy: Secondary | ICD-10-CM | POA: Diagnosis not present

## 2016-09-08 DIAGNOSIS — M792 Neuralgia and neuritis, unspecified: Secondary | ICD-10-CM | POA: Diagnosis not present

## 2016-09-08 DIAGNOSIS — G5790 Unspecified mononeuropathy of unspecified lower limb: Secondary | ICD-10-CM | POA: Diagnosis not present

## 2016-09-08 DIAGNOSIS — M79671 Pain in right foot: Secondary | ICD-10-CM | POA: Diagnosis not present

## 2016-09-11 DIAGNOSIS — N3080 Other cystitis without hematuria: Secondary | ICD-10-CM | POA: Diagnosis not present

## 2016-09-11 DIAGNOSIS — J440 Chronic obstructive pulmonary disease with acute lower respiratory infection: Secondary | ICD-10-CM | POA: Diagnosis not present

## 2016-10-03 DIAGNOSIS — M1009 Idiopathic gout, multiple sites: Secondary | ICD-10-CM | POA: Diagnosis not present

## 2016-10-03 DIAGNOSIS — J441 Chronic obstructive pulmonary disease with (acute) exacerbation: Secondary | ICD-10-CM | POA: Diagnosis not present

## 2016-10-03 DIAGNOSIS — N39498 Other specified urinary incontinence: Secondary | ICD-10-CM | POA: Diagnosis not present

## 2016-10-13 DIAGNOSIS — M792 Neuralgia and neuritis, unspecified: Secondary | ICD-10-CM | POA: Diagnosis not present

## 2016-10-13 DIAGNOSIS — M79671 Pain in right foot: Secondary | ICD-10-CM | POA: Diagnosis not present

## 2016-10-13 DIAGNOSIS — G600 Hereditary motor and sensory neuropathy: Secondary | ICD-10-CM | POA: Diagnosis not present

## 2016-10-13 DIAGNOSIS — G5790 Unspecified mononeuropathy of unspecified lower limb: Secondary | ICD-10-CM | POA: Diagnosis not present

## 2016-11-14 DIAGNOSIS — Z0000 Encounter for general adult medical examination without abnormal findings: Secondary | ICD-10-CM | POA: Diagnosis not present

## 2016-11-14 DIAGNOSIS — Z13220 Encounter for screening for lipoid disorders: Secondary | ICD-10-CM | POA: Diagnosis not present

## 2016-11-14 DIAGNOSIS — Z1389 Encounter for screening for other disorder: Secondary | ICD-10-CM | POA: Diagnosis not present

## 2016-11-14 DIAGNOSIS — J440 Chronic obstructive pulmonary disease with acute lower respiratory infection: Secondary | ICD-10-CM | POA: Diagnosis not present

## 2016-11-14 DIAGNOSIS — Z131 Encounter for screening for diabetes mellitus: Secondary | ICD-10-CM | POA: Diagnosis not present

## 2016-11-17 DIAGNOSIS — M25579 Pain in unspecified ankle and joints of unspecified foot: Secondary | ICD-10-CM | POA: Diagnosis not present

## 2016-11-17 DIAGNOSIS — G600 Hereditary motor and sensory neuropathy: Secondary | ICD-10-CM | POA: Diagnosis not present

## 2016-11-17 DIAGNOSIS — M79671 Pain in right foot: Secondary | ICD-10-CM | POA: Diagnosis not present

## 2016-11-17 DIAGNOSIS — M792 Neuralgia and neuritis, unspecified: Secondary | ICD-10-CM | POA: Diagnosis not present

## 2016-11-17 DIAGNOSIS — G5790 Unspecified mononeuropathy of unspecified lower limb: Secondary | ICD-10-CM | POA: Diagnosis not present

## 2016-11-17 DIAGNOSIS — M79672 Pain in left foot: Secondary | ICD-10-CM | POA: Diagnosis not present

## 2016-12-02 DIAGNOSIS — J441 Chronic obstructive pulmonary disease with (acute) exacerbation: Secondary | ICD-10-CM | POA: Diagnosis not present

## 2016-12-02 DIAGNOSIS — M1009 Idiopathic gout, multiple sites: Secondary | ICD-10-CM | POA: Diagnosis not present

## 2016-12-08 DIAGNOSIS — Z961 Presence of intraocular lens: Secondary | ICD-10-CM | POA: Diagnosis not present

## 2016-12-08 DIAGNOSIS — H52223 Regular astigmatism, bilateral: Secondary | ICD-10-CM | POA: Diagnosis not present

## 2016-12-08 DIAGNOSIS — H40013 Open angle with borderline findings, low risk, bilateral: Secondary | ICD-10-CM | POA: Diagnosis not present

## 2016-12-08 DIAGNOSIS — H53002 Unspecified amblyopia, left eye: Secondary | ICD-10-CM | POA: Diagnosis not present

## 2016-12-08 DIAGNOSIS — Z9842 Cataract extraction status, left eye: Secondary | ICD-10-CM | POA: Diagnosis not present

## 2016-12-08 DIAGNOSIS — H524 Presbyopia: Secondary | ICD-10-CM | POA: Diagnosis not present

## 2016-12-08 DIAGNOSIS — H26491 Other secondary cataract, right eye: Secondary | ICD-10-CM | POA: Diagnosis not present

## 2016-12-08 DIAGNOSIS — H5212 Myopia, left eye: Secondary | ICD-10-CM | POA: Diagnosis not present

## 2016-12-15 DIAGNOSIS — M79672 Pain in left foot: Secondary | ICD-10-CM | POA: Diagnosis not present

## 2016-12-15 DIAGNOSIS — M79671 Pain in right foot: Secondary | ICD-10-CM | POA: Diagnosis not present

## 2016-12-15 DIAGNOSIS — M25579 Pain in unspecified ankle and joints of unspecified foot: Secondary | ICD-10-CM | POA: Diagnosis not present

## 2017-01-12 DIAGNOSIS — M25579 Pain in unspecified ankle and joints of unspecified foot: Secondary | ICD-10-CM | POA: Diagnosis not present

## 2017-01-12 DIAGNOSIS — M79671 Pain in right foot: Secondary | ICD-10-CM | POA: Diagnosis not present

## 2017-01-12 DIAGNOSIS — M79672 Pain in left foot: Secondary | ICD-10-CM | POA: Diagnosis not present

## 2017-01-31 DIAGNOSIS — J441 Chronic obstructive pulmonary disease with (acute) exacerbation: Secondary | ICD-10-CM | POA: Diagnosis not present

## 2017-02-09 DIAGNOSIS — M25579 Pain in unspecified ankle and joints of unspecified foot: Secondary | ICD-10-CM | POA: Diagnosis not present

## 2017-02-09 DIAGNOSIS — M79672 Pain in left foot: Secondary | ICD-10-CM | POA: Diagnosis not present

## 2017-02-09 DIAGNOSIS — M79671 Pain in right foot: Secondary | ICD-10-CM | POA: Diagnosis not present

## 2017-02-16 DIAGNOSIS — M1049 Other secondary gout, multiple sites: Secondary | ICD-10-CM | POA: Diagnosis not present

## 2017-02-16 DIAGNOSIS — N4289 Other specified disorders of prostate: Secondary | ICD-10-CM | POA: Diagnosis not present

## 2017-02-16 DIAGNOSIS — J440 Chronic obstructive pulmonary disease with acute lower respiratory infection: Secondary | ICD-10-CM | POA: Diagnosis not present

## 2017-02-16 DIAGNOSIS — M545 Low back pain: Secondary | ICD-10-CM | POA: Diagnosis not present

## 2017-02-28 DIAGNOSIS — J449 Chronic obstructive pulmonary disease, unspecified: Secondary | ICD-10-CM | POA: Diagnosis not present

## 2017-02-28 DIAGNOSIS — R9431 Abnormal electrocardiogram [ECG] [EKG]: Secondary | ICD-10-CM | POA: Diagnosis not present

## 2017-02-28 DIAGNOSIS — T675XXA Heat exhaustion, unspecified, initial encounter: Secondary | ICD-10-CM | POA: Diagnosis not present

## 2017-02-28 DIAGNOSIS — Z79899 Other long term (current) drug therapy: Secondary | ICD-10-CM | POA: Diagnosis not present

## 2017-02-28 DIAGNOSIS — F419 Anxiety disorder, unspecified: Secondary | ICD-10-CM | POA: Diagnosis not present

## 2017-02-28 DIAGNOSIS — R42 Dizziness and giddiness: Secondary | ICD-10-CM | POA: Diagnosis not present

## 2017-02-28 DIAGNOSIS — R531 Weakness: Secondary | ICD-10-CM | POA: Diagnosis not present

## 2017-02-28 DIAGNOSIS — I252 Old myocardial infarction: Secondary | ICD-10-CM | POA: Diagnosis not present

## 2017-02-28 DIAGNOSIS — Z8639 Personal history of other endocrine, nutritional and metabolic disease: Secondary | ICD-10-CM | POA: Diagnosis not present

## 2017-02-28 DIAGNOSIS — R404 Transient alteration of awareness: Secondary | ICD-10-CM | POA: Diagnosis not present

## 2017-02-28 DIAGNOSIS — F329 Major depressive disorder, single episode, unspecified: Secondary | ICD-10-CM | POA: Diagnosis not present

## 2017-03-09 DIAGNOSIS — M79672 Pain in left foot: Secondary | ICD-10-CM | POA: Diagnosis not present

## 2017-03-09 DIAGNOSIS — M79671 Pain in right foot: Secondary | ICD-10-CM | POA: Diagnosis not present

## 2017-03-09 DIAGNOSIS — M25579 Pain in unspecified ankle and joints of unspecified foot: Secondary | ICD-10-CM | POA: Diagnosis not present

## 2017-04-01 DIAGNOSIS — J441 Chronic obstructive pulmonary disease with (acute) exacerbation: Secondary | ICD-10-CM | POA: Diagnosis not present

## 2017-04-01 DIAGNOSIS — M1009 Idiopathic gout, multiple sites: Secondary | ICD-10-CM | POA: Diagnosis not present

## 2017-04-01 DIAGNOSIS — N39498 Other specified urinary incontinence: Secondary | ICD-10-CM | POA: Diagnosis not present

## 2017-04-06 DIAGNOSIS — M79672 Pain in left foot: Secondary | ICD-10-CM | POA: Diagnosis not present

## 2017-04-06 DIAGNOSIS — M25579 Pain in unspecified ankle and joints of unspecified foot: Secondary | ICD-10-CM | POA: Diagnosis not present

## 2017-04-06 DIAGNOSIS — M79671 Pain in right foot: Secondary | ICD-10-CM | POA: Diagnosis not present

## 2017-05-05 DIAGNOSIS — I739 Peripheral vascular disease, unspecified: Secondary | ICD-10-CM | POA: Diagnosis not present

## 2017-05-07 DIAGNOSIS — M810 Age-related osteoporosis without current pathological fracture: Secondary | ICD-10-CM | POA: Diagnosis not present

## 2017-05-28 DIAGNOSIS — M1049 Other secondary gout, multiple sites: Secondary | ICD-10-CM | POA: Diagnosis not present

## 2017-05-28 DIAGNOSIS — M545 Low back pain: Secondary | ICD-10-CM | POA: Diagnosis not present

## 2017-05-28 DIAGNOSIS — Z79899 Other long term (current) drug therapy: Secondary | ICD-10-CM | POA: Diagnosis not present

## 2017-05-28 DIAGNOSIS — N4289 Other specified disorders of prostate: Secondary | ICD-10-CM | POA: Diagnosis not present

## 2017-05-28 DIAGNOSIS — J440 Chronic obstructive pulmonary disease with acute lower respiratory infection: Secondary | ICD-10-CM | POA: Diagnosis not present

## 2017-05-31 DIAGNOSIS — M1009 Idiopathic gout, multiple sites: Secondary | ICD-10-CM | POA: Diagnosis not present

## 2017-05-31 DIAGNOSIS — N39498 Other specified urinary incontinence: Secondary | ICD-10-CM | POA: Diagnosis not present

## 2017-05-31 DIAGNOSIS — J441 Chronic obstructive pulmonary disease with (acute) exacerbation: Secondary | ICD-10-CM | POA: Diagnosis not present

## 2017-06-02 DIAGNOSIS — M79672 Pain in left foot: Secondary | ICD-10-CM | POA: Diagnosis not present

## 2017-06-02 DIAGNOSIS — M79671 Pain in right foot: Secondary | ICD-10-CM | POA: Diagnosis not present

## 2017-06-02 DIAGNOSIS — M25579 Pain in unspecified ankle and joints of unspecified foot: Secondary | ICD-10-CM | POA: Diagnosis not present

## 2017-06-30 DIAGNOSIS — M79672 Pain in left foot: Secondary | ICD-10-CM | POA: Diagnosis not present

## 2017-06-30 DIAGNOSIS — M25579 Pain in unspecified ankle and joints of unspecified foot: Secondary | ICD-10-CM | POA: Diagnosis not present

## 2017-06-30 DIAGNOSIS — M79671 Pain in right foot: Secondary | ICD-10-CM | POA: Diagnosis not present

## 2017-07-14 DIAGNOSIS — I739 Peripheral vascular disease, unspecified: Secondary | ICD-10-CM | POA: Diagnosis not present

## 2017-07-28 DIAGNOSIS — M79672 Pain in left foot: Secondary | ICD-10-CM | POA: Diagnosis not present

## 2017-07-28 DIAGNOSIS — M25579 Pain in unspecified ankle and joints of unspecified foot: Secondary | ICD-10-CM | POA: Diagnosis not present

## 2017-07-28 DIAGNOSIS — M79671 Pain in right foot: Secondary | ICD-10-CM | POA: Diagnosis not present

## 2017-07-30 DIAGNOSIS — N39498 Other specified urinary incontinence: Secondary | ICD-10-CM | POA: Diagnosis not present

## 2017-07-30 DIAGNOSIS — J441 Chronic obstructive pulmonary disease with (acute) exacerbation: Secondary | ICD-10-CM | POA: Diagnosis not present

## 2017-07-30 DIAGNOSIS — M1009 Idiopathic gout, multiple sites: Secondary | ICD-10-CM | POA: Diagnosis not present

## 2017-08-16 DIAGNOSIS — D509 Iron deficiency anemia, unspecified: Secondary | ICD-10-CM | POA: Diagnosis present

## 2017-08-16 DIAGNOSIS — K219 Gastro-esophageal reflux disease without esophagitis: Secondary | ICD-10-CM | POA: Diagnosis present

## 2017-08-16 DIAGNOSIS — I951 Orthostatic hypotension: Secondary | ICD-10-CM | POA: Diagnosis present

## 2017-08-16 DIAGNOSIS — M545 Low back pain: Secondary | ICD-10-CM | POA: Diagnosis present

## 2017-08-16 DIAGNOSIS — R0603 Acute respiratory distress: Secondary | ICD-10-CM | POA: Diagnosis not present

## 2017-08-16 DIAGNOSIS — J449 Chronic obstructive pulmonary disease, unspecified: Secondary | ICD-10-CM | POA: Diagnosis not present

## 2017-08-16 DIAGNOSIS — A4151 Sepsis due to Escherichia coli [E. coli]: Secondary | ICD-10-CM | POA: Diagnosis not present

## 2017-08-16 DIAGNOSIS — R6521 Severe sepsis with septic shock: Secondary | ICD-10-CM | POA: Diagnosis not present

## 2017-08-16 DIAGNOSIS — Z720 Tobacco use: Secondary | ICD-10-CM | POA: Diagnosis not present

## 2017-08-16 DIAGNOSIS — N390 Urinary tract infection, site not specified: Secondary | ICD-10-CM | POA: Diagnosis not present

## 2017-08-16 DIAGNOSIS — G8929 Other chronic pain: Secondary | ICD-10-CM | POA: Diagnosis present

## 2017-08-16 DIAGNOSIS — G238 Other specified degenerative diseases of basal ganglia: Secondary | ICD-10-CM | POA: Diagnosis not present

## 2017-08-16 DIAGNOSIS — N3081 Other cystitis with hematuria: Secondary | ICD-10-CM | POA: Diagnosis not present

## 2017-08-16 DIAGNOSIS — J111 Influenza due to unidentified influenza virus with other respiratory manifestations: Secondary | ICD-10-CM | POA: Diagnosis not present

## 2017-08-16 DIAGNOSIS — Z79899 Other long term (current) drug therapy: Secondary | ICD-10-CM | POA: Diagnosis not present

## 2017-08-16 DIAGNOSIS — Z7982 Long term (current) use of aspirin: Secondary | ICD-10-CM | POA: Diagnosis not present

## 2017-08-16 DIAGNOSIS — F418 Other specified anxiety disorders: Secondary | ICD-10-CM | POA: Diagnosis present

## 2017-08-16 DIAGNOSIS — F17200 Nicotine dependence, unspecified, uncomplicated: Secondary | ICD-10-CM | POA: Diagnosis present

## 2017-08-16 DIAGNOSIS — J690 Pneumonitis due to inhalation of food and vomit: Secondary | ICD-10-CM | POA: Diagnosis not present

## 2017-08-16 DIAGNOSIS — R061 Stridor: Secondary | ICD-10-CM | POA: Diagnosis not present

## 2017-08-16 DIAGNOSIS — A419 Sepsis, unspecified organism: Secondary | ICD-10-CM | POA: Diagnosis not present

## 2017-08-16 DIAGNOSIS — R402431 Glasgow coma scale score 3-8, in the field [EMT or ambulance]: Secondary | ICD-10-CM | POA: Diagnosis not present

## 2017-08-16 DIAGNOSIS — N400 Enlarged prostate without lower urinary tract symptoms: Secondary | ICD-10-CM | POA: Diagnosis present

## 2017-08-16 DIAGNOSIS — E860 Dehydration: Secondary | ICD-10-CM | POA: Diagnosis not present

## 2017-08-16 DIAGNOSIS — Z8249 Family history of ischemic heart disease and other diseases of the circulatory system: Secondary | ICD-10-CM | POA: Diagnosis not present

## 2017-08-28 DIAGNOSIS — J449 Chronic obstructive pulmonary disease, unspecified: Secondary | ICD-10-CM | POA: Diagnosis not present

## 2017-08-28 DIAGNOSIS — N3081 Other cystitis with hematuria: Secondary | ICD-10-CM | POA: Diagnosis not present

## 2017-08-28 DIAGNOSIS — G238 Other specified degenerative diseases of basal ganglia: Secondary | ICD-10-CM | POA: Diagnosis not present

## 2017-09-15 DIAGNOSIS — M79672 Pain in left foot: Secondary | ICD-10-CM | POA: Diagnosis not present

## 2017-09-15 DIAGNOSIS — M79671 Pain in right foot: Secondary | ICD-10-CM | POA: Diagnosis not present

## 2017-09-15 DIAGNOSIS — M25579 Pain in unspecified ankle and joints of unspecified foot: Secondary | ICD-10-CM | POA: Diagnosis not present

## 2017-09-28 DIAGNOSIS — M1049 Other secondary gout, multiple sites: Secondary | ICD-10-CM | POA: Diagnosis not present

## 2017-09-28 DIAGNOSIS — F980 Enuresis not due to a substance or known physiological condition: Secondary | ICD-10-CM | POA: Diagnosis not present

## 2017-09-28 DIAGNOSIS — J441 Chronic obstructive pulmonary disease with (acute) exacerbation: Secondary | ICD-10-CM | POA: Diagnosis not present

## 2017-09-29 DIAGNOSIS — I739 Peripheral vascular disease, unspecified: Secondary | ICD-10-CM | POA: Diagnosis not present

## 2017-10-19 DIAGNOSIS — M79672 Pain in left foot: Secondary | ICD-10-CM | POA: Diagnosis not present

## 2017-10-19 DIAGNOSIS — M79671 Pain in right foot: Secondary | ICD-10-CM | POA: Diagnosis not present

## 2017-10-19 DIAGNOSIS — M25579 Pain in unspecified ankle and joints of unspecified foot: Secondary | ICD-10-CM | POA: Diagnosis not present

## 2017-11-16 DIAGNOSIS — I739 Peripheral vascular disease, unspecified: Secondary | ICD-10-CM | POA: Diagnosis not present

## 2017-11-27 DIAGNOSIS — R32 Unspecified urinary incontinence: Secondary | ICD-10-CM | POA: Diagnosis not present

## 2017-11-27 DIAGNOSIS — M1009 Idiopathic gout, multiple sites: Secondary | ICD-10-CM | POA: Diagnosis not present

## 2017-11-27 DIAGNOSIS — J441 Chronic obstructive pulmonary disease with (acute) exacerbation: Secondary | ICD-10-CM | POA: Diagnosis not present

## 2017-11-30 DIAGNOSIS — M1612 Unilateral primary osteoarthritis, left hip: Secondary | ICD-10-CM | POA: Diagnosis not present

## 2017-11-30 DIAGNOSIS — N3081 Other cystitis with hematuria: Secondary | ICD-10-CM | POA: Diagnosis not present

## 2017-11-30 DIAGNOSIS — M5136 Other intervertebral disc degeneration, lumbar region: Secondary | ICD-10-CM | POA: Diagnosis not present

## 2017-11-30 DIAGNOSIS — J449 Chronic obstructive pulmonary disease, unspecified: Secondary | ICD-10-CM | POA: Diagnosis not present

## 2017-11-30 DIAGNOSIS — G238 Other specified degenerative diseases of basal ganglia: Secondary | ICD-10-CM | POA: Diagnosis not present

## 2017-11-30 DIAGNOSIS — I10 Essential (primary) hypertension: Secondary | ICD-10-CM | POA: Diagnosis not present

## 2017-11-30 DIAGNOSIS — M25552 Pain in left hip: Secondary | ICD-10-CM | POA: Diagnosis not present

## 2017-11-30 DIAGNOSIS — S7012XS Contusion of left thigh, sequela: Secondary | ICD-10-CM | POA: Diagnosis not present

## 2017-12-22 DIAGNOSIS — M25579 Pain in unspecified ankle and joints of unspecified foot: Secondary | ICD-10-CM | POA: Diagnosis not present

## 2017-12-22 DIAGNOSIS — M79671 Pain in right foot: Secondary | ICD-10-CM | POA: Diagnosis not present

## 2017-12-22 DIAGNOSIS — M79672 Pain in left foot: Secondary | ICD-10-CM | POA: Diagnosis not present

## 2017-12-29 DIAGNOSIS — M79671 Pain in right foot: Secondary | ICD-10-CM | POA: Diagnosis not present

## 2017-12-29 DIAGNOSIS — M25579 Pain in unspecified ankle and joints of unspecified foot: Secondary | ICD-10-CM | POA: Diagnosis not present

## 2018-01-26 DIAGNOSIS — M1009 Idiopathic gout, multiple sites: Secondary | ICD-10-CM | POA: Diagnosis not present

## 2018-01-26 DIAGNOSIS — J441 Chronic obstructive pulmonary disease with (acute) exacerbation: Secondary | ICD-10-CM | POA: Diagnosis not present

## 2018-01-26 DIAGNOSIS — F980 Enuresis not due to a substance or known physiological condition: Secondary | ICD-10-CM | POA: Diagnosis not present

## 2018-01-26 DIAGNOSIS — M79672 Pain in left foot: Secondary | ICD-10-CM | POA: Diagnosis not present

## 2018-01-26 DIAGNOSIS — M79671 Pain in right foot: Secondary | ICD-10-CM | POA: Diagnosis not present

## 2018-01-26 DIAGNOSIS — M25579 Pain in unspecified ankle and joints of unspecified foot: Secondary | ICD-10-CM | POA: Diagnosis not present

## 2018-02-22 DIAGNOSIS — M79672 Pain in left foot: Secondary | ICD-10-CM | POA: Diagnosis not present

## 2018-02-22 DIAGNOSIS — M25579 Pain in unspecified ankle and joints of unspecified foot: Secondary | ICD-10-CM | POA: Diagnosis not present

## 2018-02-22 DIAGNOSIS — M79671 Pain in right foot: Secondary | ICD-10-CM | POA: Diagnosis not present

## 2018-03-01 DIAGNOSIS — Z0000 Encounter for general adult medical examination without abnormal findings: Secondary | ICD-10-CM | POA: Diagnosis not present

## 2018-03-01 DIAGNOSIS — J441 Chronic obstructive pulmonary disease with (acute) exacerbation: Secondary | ICD-10-CM | POA: Diagnosis not present

## 2018-03-01 DIAGNOSIS — E782 Mixed hyperlipidemia: Secondary | ICD-10-CM | POA: Diagnosis not present

## 2018-03-01 DIAGNOSIS — E440 Moderate protein-calorie malnutrition: Secondary | ICD-10-CM | POA: Diagnosis not present

## 2018-03-01 DIAGNOSIS — Z1389 Encounter for screening for other disorder: Secondary | ICD-10-CM | POA: Diagnosis not present

## 2018-03-01 DIAGNOSIS — I10 Essential (primary) hypertension: Secondary | ICD-10-CM | POA: Diagnosis not present

## 2018-03-01 DIAGNOSIS — M545 Low back pain: Secondary | ICD-10-CM | POA: Diagnosis not present

## 2018-03-22 DIAGNOSIS — M79671 Pain in right foot: Secondary | ICD-10-CM | POA: Diagnosis not present

## 2018-03-22 DIAGNOSIS — M25579 Pain in unspecified ankle and joints of unspecified foot: Secondary | ICD-10-CM | POA: Diagnosis not present

## 2018-03-22 DIAGNOSIS — M79672 Pain in left foot: Secondary | ICD-10-CM | POA: Diagnosis not present

## 2018-03-27 DIAGNOSIS — M1009 Idiopathic gout, multiple sites: Secondary | ICD-10-CM | POA: Diagnosis not present

## 2018-03-27 DIAGNOSIS — N39498 Other specified urinary incontinence: Secondary | ICD-10-CM | POA: Diagnosis not present

## 2018-03-27 DIAGNOSIS — J441 Chronic obstructive pulmonary disease with (acute) exacerbation: Secondary | ICD-10-CM | POA: Diagnosis not present

## 2018-04-19 DIAGNOSIS — M79671 Pain in right foot: Secondary | ICD-10-CM | POA: Diagnosis not present

## 2018-04-19 DIAGNOSIS — M25579 Pain in unspecified ankle and joints of unspecified foot: Secondary | ICD-10-CM | POA: Diagnosis not present

## 2018-04-19 DIAGNOSIS — M79672 Pain in left foot: Secondary | ICD-10-CM | POA: Diagnosis not present

## 2018-05-17 DIAGNOSIS — M79672 Pain in left foot: Secondary | ICD-10-CM | POA: Diagnosis not present

## 2018-05-17 DIAGNOSIS — M79671 Pain in right foot: Secondary | ICD-10-CM | POA: Diagnosis not present

## 2018-05-17 DIAGNOSIS — M25579 Pain in unspecified ankle and joints of unspecified foot: Secondary | ICD-10-CM | POA: Diagnosis not present

## 2018-05-26 DIAGNOSIS — M109 Gout, unspecified: Secondary | ICD-10-CM | POA: Diagnosis not present

## 2018-05-26 DIAGNOSIS — J441 Chronic obstructive pulmonary disease with (acute) exacerbation: Secondary | ICD-10-CM | POA: Diagnosis not present

## 2018-05-26 DIAGNOSIS — R32 Unspecified urinary incontinence: Secondary | ICD-10-CM | POA: Diagnosis not present

## 2018-06-01 DIAGNOSIS — E782 Mixed hyperlipidemia: Secondary | ICD-10-CM | POA: Diagnosis not present

## 2018-06-01 DIAGNOSIS — J449 Chronic obstructive pulmonary disease, unspecified: Secondary | ICD-10-CM | POA: Diagnosis not present

## 2018-06-01 DIAGNOSIS — M545 Low back pain: Secondary | ICD-10-CM | POA: Diagnosis not present

## 2018-06-01 DIAGNOSIS — N3000 Acute cystitis without hematuria: Secondary | ICD-10-CM | POA: Diagnosis not present

## 2018-06-01 DIAGNOSIS — I10 Essential (primary) hypertension: Secondary | ICD-10-CM | POA: Diagnosis not present

## 2018-06-01 DIAGNOSIS — E440 Moderate protein-calorie malnutrition: Secondary | ICD-10-CM | POA: Diagnosis not present

## 2018-06-21 DIAGNOSIS — I739 Peripheral vascular disease, unspecified: Secondary | ICD-10-CM | POA: Diagnosis not present

## 2018-07-19 DIAGNOSIS — M25579 Pain in unspecified ankle and joints of unspecified foot: Secondary | ICD-10-CM | POA: Diagnosis not present

## 2018-07-19 DIAGNOSIS — M79672 Pain in left foot: Secondary | ICD-10-CM | POA: Diagnosis not present

## 2018-07-19 DIAGNOSIS — M79671 Pain in right foot: Secondary | ICD-10-CM | POA: Diagnosis not present

## 2018-07-25 DIAGNOSIS — M109 Gout, unspecified: Secondary | ICD-10-CM | POA: Diagnosis not present

## 2018-07-25 DIAGNOSIS — R32 Unspecified urinary incontinence: Secondary | ICD-10-CM | POA: Diagnosis not present

## 2018-07-25 DIAGNOSIS — J441 Chronic obstructive pulmonary disease with (acute) exacerbation: Secondary | ICD-10-CM | POA: Diagnosis not present

## 2018-08-17 DIAGNOSIS — I739 Peripheral vascular disease, unspecified: Secondary | ICD-10-CM | POA: Diagnosis not present

## 2018-09-03 DIAGNOSIS — E440 Moderate protein-calorie malnutrition: Secondary | ICD-10-CM | POA: Diagnosis not present

## 2018-09-03 DIAGNOSIS — E782 Mixed hyperlipidemia: Secondary | ICD-10-CM | POA: Diagnosis not present

## 2018-09-03 DIAGNOSIS — M545 Low back pain: Secondary | ICD-10-CM | POA: Diagnosis not present

## 2018-09-03 DIAGNOSIS — I10 Essential (primary) hypertension: Secondary | ICD-10-CM | POA: Diagnosis not present

## 2018-09-03 DIAGNOSIS — J449 Chronic obstructive pulmonary disease, unspecified: Secondary | ICD-10-CM | POA: Diagnosis not present

## 2018-09-23 DIAGNOSIS — M25579 Pain in unspecified ankle and joints of unspecified foot: Secondary | ICD-10-CM | POA: Diagnosis not present

## 2018-09-23 DIAGNOSIS — J441 Chronic obstructive pulmonary disease with (acute) exacerbation: Secondary | ICD-10-CM | POA: Diagnosis not present

## 2018-09-23 DIAGNOSIS — M79672 Pain in left foot: Secondary | ICD-10-CM | POA: Diagnosis not present

## 2018-09-23 DIAGNOSIS — M79671 Pain in right foot: Secondary | ICD-10-CM | POA: Diagnosis not present

## 2018-09-23 DIAGNOSIS — M109 Gout, unspecified: Secondary | ICD-10-CM | POA: Diagnosis not present

## 2018-09-23 DIAGNOSIS — R32 Unspecified urinary incontinence: Secondary | ICD-10-CM | POA: Diagnosis not present

## 2018-10-05 DIAGNOSIS — I739 Peripheral vascular disease, unspecified: Secondary | ICD-10-CM | POA: Diagnosis not present

## 2018-11-22 DIAGNOSIS — M109 Gout, unspecified: Secondary | ICD-10-CM | POA: Diagnosis not present

## 2018-11-22 DIAGNOSIS — J441 Chronic obstructive pulmonary disease with (acute) exacerbation: Secondary | ICD-10-CM | POA: Diagnosis not present

## 2018-11-22 DIAGNOSIS — R32 Unspecified urinary incontinence: Secondary | ICD-10-CM | POA: Diagnosis not present

## 2018-12-01 DIAGNOSIS — J449 Chronic obstructive pulmonary disease, unspecified: Secondary | ICD-10-CM | POA: Diagnosis not present

## 2018-12-01 DIAGNOSIS — B964 Proteus (mirabilis) (morganii) as the cause of diseases classified elsewhere: Secondary | ICD-10-CM | POA: Diagnosis not present

## 2018-12-01 DIAGNOSIS — L03115 Cellulitis of right lower limb: Secondary | ICD-10-CM | POA: Diagnosis not present

## 2018-12-01 DIAGNOSIS — N400 Enlarged prostate without lower urinary tract symptoms: Secondary | ICD-10-CM | POA: Diagnosis present

## 2018-12-01 DIAGNOSIS — F418 Other specified anxiety disorders: Secondary | ICD-10-CM | POA: Diagnosis present

## 2018-12-01 DIAGNOSIS — D649 Anemia, unspecified: Secondary | ICD-10-CM | POA: Diagnosis present

## 2018-12-01 DIAGNOSIS — K219 Gastro-esophageal reflux disease without esophagitis: Secondary | ICD-10-CM | POA: Diagnosis present

## 2018-12-01 DIAGNOSIS — L89619 Pressure ulcer of right heel, unspecified stage: Secondary | ICD-10-CM | POA: Diagnosis not present

## 2018-12-01 DIAGNOSIS — Z7982 Long term (current) use of aspirin: Secondary | ICD-10-CM | POA: Diagnosis not present

## 2018-12-01 DIAGNOSIS — G8929 Other chronic pain: Secondary | ICD-10-CM | POA: Diagnosis present

## 2018-12-01 DIAGNOSIS — Z79899 Other long term (current) drug therapy: Secondary | ICD-10-CM | POA: Diagnosis not present

## 2018-12-01 DIAGNOSIS — L89610 Pressure ulcer of right heel, unstageable: Secondary | ICD-10-CM | POA: Diagnosis not present

## 2018-12-01 DIAGNOSIS — E871 Hypo-osmolality and hyponatremia: Secondary | ICD-10-CM | POA: Diagnosis not present

## 2018-12-01 DIAGNOSIS — F17200 Nicotine dependence, unspecified, uncomplicated: Secondary | ICD-10-CM | POA: Diagnosis present

## 2018-12-01 DIAGNOSIS — M545 Low back pain: Secondary | ICD-10-CM | POA: Diagnosis present

## 2018-12-01 DIAGNOSIS — M109 Gout, unspecified: Secondary | ICD-10-CM | POA: Diagnosis not present

## 2018-12-07 DIAGNOSIS — Z79899 Other long term (current) drug therapy: Secondary | ICD-10-CM | POA: Diagnosis not present

## 2018-12-07 DIAGNOSIS — L89613 Pressure ulcer of right heel, stage 3: Secondary | ICD-10-CM | POA: Diagnosis not present

## 2018-12-07 DIAGNOSIS — J449 Chronic obstructive pulmonary disease, unspecified: Secondary | ICD-10-CM | POA: Diagnosis not present

## 2018-12-14 DIAGNOSIS — L89613 Pressure ulcer of right heel, stage 3: Secondary | ICD-10-CM | POA: Diagnosis not present

## 2018-12-14 DIAGNOSIS — J449 Chronic obstructive pulmonary disease, unspecified: Secondary | ICD-10-CM | POA: Diagnosis not present

## 2018-12-14 DIAGNOSIS — L0390 Cellulitis, unspecified: Secondary | ICD-10-CM | POA: Diagnosis not present

## 2018-12-14 DIAGNOSIS — Z79899 Other long term (current) drug therapy: Secondary | ICD-10-CM | POA: Diagnosis not present

## 2018-12-26 DIAGNOSIS — N390 Urinary tract infection, site not specified: Secondary | ICD-10-CM | POA: Diagnosis not present

## 2018-12-31 DIAGNOSIS — L89613 Pressure ulcer of right heel, stage 3: Secondary | ICD-10-CM | POA: Diagnosis not present

## 2019-01-09 ENCOUNTER — Emergency Department (HOSPITAL_COMMUNITY): Payer: Medicare Other

## 2019-01-09 ENCOUNTER — Inpatient Hospital Stay (HOSPITAL_COMMUNITY)
Admission: EM | Admit: 2019-01-09 | Discharge: 2019-01-20 | DRG: 870 | Disposition: A | Discharge status: Skilled Nursing Facility | Payer: Medicare Other | Attending: Internal Medicine | Admitting: Internal Medicine

## 2019-01-09 ENCOUNTER — Encounter (HOSPITAL_COMMUNITY): Payer: Self-pay | Admitting: Emergency Medicine

## 2019-01-09 ENCOUNTER — Other Ambulatory Visit: Payer: Self-pay

## 2019-01-09 ENCOUNTER — Inpatient Hospital Stay (HOSPITAL_COMMUNITY): Payer: Medicare Other

## 2019-01-09 DIAGNOSIS — I959 Hypotension, unspecified: Secondary | ICD-10-CM | POA: Diagnosis present

## 2019-01-09 DIAGNOSIS — I248 Other forms of acute ischemic heart disease: Secondary | ICD-10-CM | POA: Diagnosis not present

## 2019-01-09 DIAGNOSIS — M6282 Rhabdomyolysis: Secondary | ICD-10-CM

## 2019-01-09 DIAGNOSIS — J181 Lobar pneumonia, unspecified organism: Secondary | ICD-10-CM

## 2019-01-09 DIAGNOSIS — R945 Abnormal results of liver function studies: Secondary | ICD-10-CM | POA: Diagnosis not present

## 2019-01-09 DIAGNOSIS — J9601 Acute respiratory failure with hypoxia: Secondary | ICD-10-CM

## 2019-01-09 DIAGNOSIS — I517 Cardiomegaly: Secondary | ICD-10-CM | POA: Diagnosis not present

## 2019-01-09 DIAGNOSIS — R4182 Altered mental status, unspecified: Secondary | ICD-10-CM

## 2019-01-09 DIAGNOSIS — N400 Enlarged prostate without lower urinary tract symptoms: Secondary | ICD-10-CM | POA: Diagnosis present

## 2019-01-09 DIAGNOSIS — L97419 Non-pressure chronic ulcer of right heel and midfoot with unspecified severity: Secondary | ICD-10-CM | POA: Diagnosis present

## 2019-01-09 DIAGNOSIS — E876 Hypokalemia: Secondary | ICD-10-CM | POA: Diagnosis not present

## 2019-01-09 DIAGNOSIS — J440 Chronic obstructive pulmonary disease with acute lower respiratory infection: Secondary | ICD-10-CM | POA: Diagnosis present

## 2019-01-09 DIAGNOSIS — J90 Pleural effusion, not elsewhere classified: Secondary | ICD-10-CM | POA: Diagnosis not present

## 2019-01-09 DIAGNOSIS — F17210 Nicotine dependence, cigarettes, uncomplicated: Secondary | ICD-10-CM | POA: Diagnosis present

## 2019-01-09 DIAGNOSIS — J189 Pneumonia, unspecified organism: Secondary | ICD-10-CM | POA: Diagnosis not present

## 2019-01-09 DIAGNOSIS — I351 Nonrheumatic aortic (valve) insufficiency: Secondary | ICD-10-CM | POA: Diagnosis present

## 2019-01-09 DIAGNOSIS — Z452 Encounter for adjustment and management of vascular access device: Secondary | ICD-10-CM

## 2019-01-09 DIAGNOSIS — L89623 Pressure ulcer of left heel, stage 3: Secondary | ICD-10-CM | POA: Diagnosis present

## 2019-01-09 DIAGNOSIS — R402112 Coma scale, eyes open, never, at arrival to emergency department: Secondary | ICD-10-CM | POA: Diagnosis present

## 2019-01-09 DIAGNOSIS — A4102 Sepsis due to Methicillin resistant Staphylococcus aureus: Secondary | ICD-10-CM | POA: Diagnosis not present

## 2019-01-09 DIAGNOSIS — I5031 Acute diastolic (congestive) heart failure: Secondary | ICD-10-CM | POA: Diagnosis present

## 2019-01-09 DIAGNOSIS — R402342 Coma scale, best motor response, flexion withdrawal, at arrival to emergency department: Secondary | ICD-10-CM | POA: Diagnosis present

## 2019-01-09 DIAGNOSIS — G9341 Metabolic encephalopathy: Secondary | ICD-10-CM | POA: Diagnosis present

## 2019-01-09 DIAGNOSIS — M1990 Unspecified osteoarthritis, unspecified site: Secondary | ICD-10-CM | POA: Diagnosis present

## 2019-01-09 DIAGNOSIS — T68XXXA Hypothermia, initial encounter: Secondary | ICD-10-CM

## 2019-01-09 DIAGNOSIS — K219 Gastro-esophageal reflux disease without esophagitis: Secondary | ICD-10-CM | POA: Diagnosis present

## 2019-01-09 DIAGNOSIS — N402 Nodular prostate without lower urinary tract symptoms: Secondary | ICD-10-CM | POA: Diagnosis not present

## 2019-01-09 DIAGNOSIS — E161 Other hypoglycemia: Secondary | ICD-10-CM | POA: Diagnosis not present

## 2019-01-09 DIAGNOSIS — R41841 Cognitive communication deficit: Secondary | ICD-10-CM | POA: Diagnosis not present

## 2019-01-09 DIAGNOSIS — Z20828 Contact with and (suspected) exposure to other viral communicable diseases: Secondary | ICD-10-CM | POA: Diagnosis present

## 2019-01-09 DIAGNOSIS — R600 Localized edema: Secondary | ICD-10-CM

## 2019-01-09 DIAGNOSIS — A419 Sepsis, unspecified organism: Principal | ICD-10-CM | POA: Diagnosis present

## 2019-01-09 DIAGNOSIS — E162 Hypoglycemia, unspecified: Secondary | ICD-10-CM | POA: Diagnosis present

## 2019-01-09 DIAGNOSIS — D61818 Other pancytopenia: Secondary | ICD-10-CM | POA: Diagnosis present

## 2019-01-09 DIAGNOSIS — Z7189 Other specified counseling: Secondary | ICD-10-CM | POA: Diagnosis not present

## 2019-01-09 DIAGNOSIS — Z515 Encounter for palliative care: Secondary | ICD-10-CM | POA: Diagnosis not present

## 2019-01-09 DIAGNOSIS — I700 Atherosclerosis of aorta: Secondary | ICD-10-CM | POA: Diagnosis present

## 2019-01-09 DIAGNOSIS — R402232 Coma scale, best verbal response, inappropriate words, at arrival to emergency department: Secondary | ICD-10-CM | POA: Diagnosis present

## 2019-01-09 DIAGNOSIS — J9621 Acute and chronic respiratory failure with hypoxia: Secondary | ICD-10-CM | POA: Diagnosis present

## 2019-01-09 DIAGNOSIS — Z66 Do not resuscitate: Secondary | ICD-10-CM | POA: Diagnosis not present

## 2019-01-09 DIAGNOSIS — R262 Difficulty in walking, not elsewhere classified: Secondary | ICD-10-CM | POA: Diagnosis not present

## 2019-01-09 DIAGNOSIS — M6281 Muscle weakness (generalized): Secondary | ICD-10-CM | POA: Diagnosis not present

## 2019-01-09 DIAGNOSIS — J810 Acute pulmonary edema: Secondary | ICD-10-CM | POA: Diagnosis not present

## 2019-01-09 DIAGNOSIS — R531 Weakness: Secondary | ICD-10-CM | POA: Diagnosis not present

## 2019-01-09 DIAGNOSIS — Z7401 Bed confinement status: Secondary | ICD-10-CM | POA: Diagnosis not present

## 2019-01-09 DIAGNOSIS — R1312 Dysphagia, oropharyngeal phase: Secondary | ICD-10-CM | POA: Diagnosis not present

## 2019-01-09 DIAGNOSIS — I509 Heart failure, unspecified: Secondary | ICD-10-CM

## 2019-01-09 DIAGNOSIS — F419 Anxiety disorder, unspecified: Secondary | ICD-10-CM | POA: Diagnosis not present

## 2019-01-09 DIAGNOSIS — N390 Urinary tract infection, site not specified: Secondary | ICD-10-CM | POA: Diagnosis present

## 2019-01-09 DIAGNOSIS — L89610 Pressure ulcer of right heel, unstageable: Secondary | ICD-10-CM | POA: Diagnosis not present

## 2019-01-09 DIAGNOSIS — M109 Gout, unspecified: Secondary | ICD-10-CM | POA: Diagnosis present

## 2019-01-09 DIAGNOSIS — Z0189 Encounter for other specified special examinations: Secondary | ICD-10-CM

## 2019-01-09 DIAGNOSIS — Y95 Nosocomial condition: Secondary | ICD-10-CM | POA: Diagnosis present

## 2019-01-09 DIAGNOSIS — Z9911 Dependence on respirator [ventilator] status: Secondary | ICD-10-CM | POA: Diagnosis not present

## 2019-01-09 DIAGNOSIS — R579 Shock, unspecified: Secondary | ICD-10-CM | POA: Diagnosis present

## 2019-01-09 DIAGNOSIS — M1A9XX0 Chronic gout, unspecified, without tophus (tophi): Secondary | ICD-10-CM | POA: Diagnosis not present

## 2019-01-09 DIAGNOSIS — Z7982 Long term (current) use of aspirin: Secondary | ICD-10-CM

## 2019-01-09 DIAGNOSIS — F99 Mental disorder, not otherwise specified: Secondary | ICD-10-CM | POA: Diagnosis not present

## 2019-01-09 DIAGNOSIS — R5381 Other malaise: Secondary | ICD-10-CM | POA: Diagnosis not present

## 2019-01-09 DIAGNOSIS — I340 Nonrheumatic mitral (valve) insufficiency: Secondary | ICD-10-CM | POA: Diagnosis not present

## 2019-01-09 DIAGNOSIS — Z4682 Encounter for fitting and adjustment of non-vascular catheter: Secondary | ICD-10-CM | POA: Diagnosis not present

## 2019-01-09 DIAGNOSIS — D638 Anemia in other chronic diseases classified elsewhere: Secondary | ICD-10-CM | POA: Diagnosis present

## 2019-01-09 DIAGNOSIS — J849 Interstitial pulmonary disease, unspecified: Secondary | ICD-10-CM | POA: Diagnosis not present

## 2019-01-09 DIAGNOSIS — J9690 Respiratory failure, unspecified, unspecified whether with hypoxia or hypercapnia: Secondary | ICD-10-CM

## 2019-01-09 DIAGNOSIS — J15212 Pneumonia due to Methicillin resistant Staphylococcus aureus: Secondary | ICD-10-CM | POA: Diagnosis present

## 2019-01-09 DIAGNOSIS — R0602 Shortness of breath: Secondary | ICD-10-CM | POA: Diagnosis not present

## 2019-01-09 DIAGNOSIS — R918 Other nonspecific abnormal finding of lung field: Secondary | ICD-10-CM | POA: Diagnosis not present

## 2019-01-09 DIAGNOSIS — I361 Nonrheumatic tricuspid (valve) insufficiency: Secondary | ICD-10-CM | POA: Diagnosis not present

## 2019-01-09 DIAGNOSIS — R609 Edema, unspecified: Secondary | ICD-10-CM | POA: Diagnosis not present

## 2019-01-09 DIAGNOSIS — R404 Transient alteration of awareness: Secondary | ICD-10-CM | POA: Diagnosis not present

## 2019-01-09 DIAGNOSIS — R1310 Dysphagia, unspecified: Secondary | ICD-10-CM | POA: Diagnosis not present

## 2019-01-09 DIAGNOSIS — J811 Chronic pulmonary edema: Secondary | ICD-10-CM | POA: Diagnosis not present

## 2019-01-09 DIAGNOSIS — J449 Chronic obstructive pulmonary disease, unspecified: Secondary | ICD-10-CM | POA: Diagnosis not present

## 2019-01-09 DIAGNOSIS — F339 Major depressive disorder, recurrent, unspecified: Secondary | ICD-10-CM | POA: Diagnosis not present

## 2019-01-09 DIAGNOSIS — D696 Thrombocytopenia, unspecified: Secondary | ICD-10-CM

## 2019-01-09 DIAGNOSIS — R6520 Severe sepsis without septic shock: Secondary | ICD-10-CM | POA: Diagnosis not present

## 2019-01-09 DIAGNOSIS — L8990 Pressure ulcer of unspecified site, unspecified stage: Secondary | ICD-10-CM

## 2019-01-09 DIAGNOSIS — F329 Major depressive disorder, single episode, unspecified: Secondary | ICD-10-CM | POA: Diagnosis present

## 2019-01-09 LAB — CBC WITH DIFFERENTIAL/PLATELET
Abs Immature Granulocytes: 0 10*3/uL (ref 0.00–0.07)
Basophils Absolute: 0 10*3/uL (ref 0.0–0.1)
Basophils Relative: 0 %
Eosinophils Absolute: 0 10*3/uL (ref 0.0–0.5)
Eosinophils Relative: 1 %
HCT: 23.7 % — ABNORMAL LOW (ref 39.0–52.0)
Hemoglobin: 8 g/dL — ABNORMAL LOW (ref 13.0–17.0)
Immature Granulocytes: 0 %
Lymphocytes Relative: 14 %
Lymphs Abs: 0.5 10*3/uL — ABNORMAL LOW (ref 0.7–4.0)
MCH: 33.8 pg (ref 26.0–34.0)
MCHC: 33.8 g/dL (ref 30.0–36.0)
MCV: 100 fL (ref 80.0–100.0)
Monocytes Absolute: 0.2 10*3/uL (ref 0.1–1.0)
Monocytes Relative: 7 %
Neutro Abs: 2.9 10*3/uL (ref 1.7–7.7)
Neutrophils Relative %: 78 %
Platelets: 101 10*3/uL — ABNORMAL LOW (ref 150–400)
RBC: 2.37 MIL/uL — ABNORMAL LOW (ref 4.22–5.81)
RDW: 15.6 % — ABNORMAL HIGH (ref 11.5–15.5)
WBC Morphology: INCREASED
WBC: 3.7 10*3/uL — ABNORMAL LOW (ref 4.0–10.5)
nRBC: 0 % (ref 0.0–0.2)

## 2019-01-09 LAB — BLOOD GAS, ARTERIAL
Acid-Base Excess: 5.2 mmol/L — ABNORMAL HIGH (ref 0.0–2.0)
Acid-Base Excess: 6 mmol/L — ABNORMAL HIGH (ref 0.0–2.0)
Acid-Base Excess: 4.8 mmol/L — ABNORMAL HIGH (ref 0.0–2.0)
Bicarbonate: 28.8 mmol/L — ABNORMAL HIGH (ref 20.0–28.0)
Bicarbonate: 29.2 mmol/L — ABNORMAL HIGH (ref 20.0–28.0)
Bicarbonate: 28.8 mmol/L — ABNORMAL HIGH (ref 20.0–28.0)
FIO2: 21
FIO2: 21
FIO2: 56
MECHVT: 500 mL
O2 Saturation: 62.2 %
O2 Saturation: 84.9 %
O2 Saturation: 98.4 %
PEEP: 5 cmH2O
Patient temperature: 37.4
Patient temperature: 37.4
Patient temperature: 37
RATE: 16 resp/min
pCO2 arterial: 45.1 mmHg (ref 32.0–48.0)
pCO2 arterial: 47.4 mmHg (ref 32.0–48.0)
pCO2 arterial: 41.1 mmHg (ref 32.0–48.0)
pH, Arterial: 7.423 (ref 7.350–7.450)
pH, Arterial: 7.43 (ref 7.350–7.450)
pH, Arterial: 7.457 — ABNORMAL HIGH (ref 7.350–7.450)
pO2, Arterial: 34.9 mmHg — CL (ref 83.0–108.0)
pO2, Arterial: 52.1 mmHg — ABNORMAL LOW (ref 83.0–108.0)
pO2, Arterial: 112 mmHg — ABNORMAL HIGH (ref 83.0–108.0)

## 2019-01-09 LAB — COMPREHENSIVE METABOLIC PANEL
ALT: 49 U/L — ABNORMAL HIGH (ref 0–44)
AST: 63 U/L — ABNORMAL HIGH (ref 15–41)
Albumin: 3 g/dL — ABNORMAL LOW (ref 3.5–5.0)
Alkaline Phosphatase: 106 U/L (ref 38–126)
Anion gap: 10 (ref 5–15)
BUN: 25 mg/dL — ABNORMAL HIGH (ref 8–23)
CO2: 29 mmol/L (ref 22–32)
Calcium: 8.9 mg/dL (ref 8.9–10.3)
Chloride: 97 mmol/L — ABNORMAL LOW (ref 98–111)
Creatinine, Ser: 0.54 mg/dL — ABNORMAL LOW (ref 0.61–1.24)
GFR calc Af Amer: >60 mL/min (ref >60)
GFR calc non Af Amer: >60 mL/min (ref >60)
Glucose, Bld: 125 mg/dL — ABNORMAL HIGH (ref 70–99)
Potassium: 4.3 mmol/L (ref 3.5–5.1)
Sodium: 136 mmol/L (ref 135–145)
Total Bilirubin: 0.5 mg/dL (ref 0.3–1.2)
Total Protein: 6 g/dL — ABNORMAL LOW (ref 6.5–8.1)

## 2019-01-09 LAB — TRIGLYCERIDES: Triglycerides: 47 mg/dL (ref <150)

## 2019-01-09 LAB — URINALYSIS, ROUTINE W REFLEX MICROSCOPIC
Bilirubin Urine: NEGATIVE
Glucose, UA: NEGATIVE mg/dL
Hgb urine dipstick: NEGATIVE
Ketones, ur: NEGATIVE mg/dL
Nitrite: NEGATIVE
Protein, ur: NEGATIVE mg/dL
Specific Gravity, Urine: 1.017 (ref 1.005–1.030)
Trans Epithel, UA: 1
pH: 5 (ref 5.0–8.0)

## 2019-01-09 LAB — TROPONIN I: Troponin I: <0.03 ng/mL (ref <0.03)

## 2019-01-09 LAB — LACTIC ACID, PLASMA
Lactic Acid, Venous: 0.9 mmol/L (ref 0.5–1.9)
Lactic Acid, Venous: 0.6 mmol/L (ref 0.5–1.9)

## 2019-01-09 LAB — GLUCOSE, CAPILLARY: Glucose-Capillary: 73 mg/dL (ref 70–99)

## 2019-01-09 LAB — BRAIN NATRIURETIC PEPTIDE: B Natriuretic Peptide: 1175 pg/mL — ABNORMAL HIGH (ref 0.0–100.0)

## 2019-01-09 LAB — CBG MONITORING, ED
Glucose-Capillary: 55 mg/dL — ABNORMAL LOW (ref 70–99)
Glucose-Capillary: 101 mg/dL — ABNORMAL HIGH (ref 70–99)

## 2019-01-09 LAB — CK: Total CK: 551 U/L — ABNORMAL HIGH (ref 49–397)

## 2019-01-09 LAB — POC OCCULT BLOOD, ED: Fecal Occult Bld: NEGATIVE

## 2019-01-09 LAB — SARS CORONAVIRUS 2 BY RT PCR (HOSPITAL ORDER, PERFORMED IN ~~LOC~~ HOSPITAL LAB): SARS Coronavirus 2: NEGATIVE

## 2019-01-09 MED ORDER — DEXTROSE 50 % IV SOLN
12.5000 g | INTRAVENOUS | Status: AC
  Administered 2019-01-09: 12.5 g via INTRAVENOUS

## 2019-01-09 MED ORDER — MIDAZOLAM HCL 2 MG/2ML IJ SOLN
1.0000 mg | INTRAMUSCULAR | Status: DC | PRN
  Filled 2019-01-09: qty 2

## 2019-01-09 MED ORDER — FAMOTIDINE IN NACL 20-0.9 MG/50ML-% IV SOLN
20.0000 mg | Freq: Two times a day (BID) | INTRAVENOUS | Status: DC
  Administered 2019-01-09 – 2019-01-15 (×12): 20 mg via INTRAVENOUS
  Filled 2019-01-09 (×12): qty 50

## 2019-01-09 MED ORDER — DEXTROSE 50 % IV SOLN
INTRAVENOUS | Status: AC
  Filled 2019-01-09: qty 50

## 2019-01-09 MED ORDER — DEXTROSE-NACL 5-0.9 % IV SOLN
INTRAVENOUS | Status: DC
  Administered 2019-01-09: 19:00:00 via INTRAVENOUS

## 2019-01-09 MED ORDER — SUCCINYLCHOLINE CHLORIDE 20 MG/ML IJ SOLN
100.0000 mg | Freq: Once | INTRAMUSCULAR | Status: AC
  Administered 2019-01-09: 100 mg via INTRAVENOUS

## 2019-01-09 MED ORDER — MIDAZOLAM HCL 2 MG/2ML IJ SOLN
1.0000 mg | INTRAMUSCULAR | Status: DC | PRN
  Administered 2019-01-09: 1 mg via INTRAVENOUS

## 2019-01-09 MED ORDER — IOHEXOL 300 MG/ML  SOLN
75.0000 mL | Freq: Once | INTRAMUSCULAR | Status: AC | PRN
  Administered 2019-01-09: 75 mL via INTRAVENOUS

## 2019-01-09 MED ORDER — FENTANYL CITRATE (PF) 100 MCG/2ML IJ SOLN
25.0000 ug | INTRAMUSCULAR | Status: DC | PRN
  Administered 2019-01-09 – 2019-01-11 (×2): 50 ug via INTRAVENOUS
  Administered 2019-01-11: 100 ug via INTRAVENOUS
  Administered 2019-01-13: 25 ug via INTRAVENOUS
  Filled 2019-01-09 (×5): qty 2

## 2019-01-09 MED ORDER — SODIUM CHLORIDE 0.9 % IV SOLN
2.0000 g | Freq: Once | INTRAVENOUS | Status: AC
  Administered 2019-01-09: 2 g via INTRAVENOUS
  Filled 2019-01-09: qty 2

## 2019-01-09 MED ORDER — ORAL CARE MOUTH RINSE
15.0000 mL | OROMUCOSAL | Status: DC
  Administered 2019-01-09 – 2019-01-14 (×47): 15 mL via OROMUCOSAL

## 2019-01-09 MED ORDER — METRONIDAZOLE IN NACL 5-0.79 MG/ML-% IV SOLN
500.0000 mg | Freq: Once | INTRAVENOUS | Status: AC
  Administered 2019-01-09: 500 mg via INTRAVENOUS
  Filled 2019-01-09: qty 100

## 2019-01-09 MED ORDER — SODIUM CHLORIDE 0.9 % IV SOLN
2.0000 g | Freq: Three times a day (TID) | INTRAVENOUS | Status: AC
  Administered 2019-01-09 – 2019-01-11 (×7): 2 g via INTRAVENOUS
  Filled 2019-01-09 (×7): qty 2

## 2019-01-09 MED ORDER — FENTANYL CITRATE (PF) 100 MCG/2ML IJ SOLN
25.0000 ug | INTRAMUSCULAR | Status: DC | PRN
  Administered 2019-01-10: 25 ug via INTRAVENOUS

## 2019-01-09 MED ORDER — VANCOMYCIN HCL 10 G IV SOLR
1500.0000 mg | INTRAVENOUS | Status: DC
  Filled 2019-01-09 (×2): qty 1500

## 2019-01-09 MED ORDER — SODIUM CHLORIDE 0.9 % IV BOLUS (SEPSIS)
1000.0000 mL | Freq: Once | INTRAVENOUS | Status: AC
  Administered 2019-01-09: 1000 mL via INTRAVENOUS

## 2019-01-09 MED ORDER — SODIUM CHLORIDE 0.9 % IV BOLUS
500.0000 mL | Freq: Once | INTRAVENOUS | Status: AC
  Administered 2019-01-09: 500 mL via INTRAVENOUS

## 2019-01-09 MED ORDER — PROPOFOL 1000 MG/100ML IV EMUL
INTRAVENOUS | Status: AC
  Filled 2019-01-09: qty 100

## 2019-01-09 MED ORDER — ALBUTEROL SULFATE (2.5 MG/3ML) 0.083% IN NEBU
2.5000 mg | INHALATION_SOLUTION | RESPIRATORY_TRACT | Status: DC
  Administered 2019-01-09 – 2019-01-19 (×58): 2.5 mg via RESPIRATORY_TRACT
  Filled 2019-01-09 (×58): qty 3

## 2019-01-09 MED ORDER — MIDAZOLAM HCL 2 MG/2ML IJ SOLN
1.0000 mg | INTRAMUSCULAR | Status: DC | PRN
  Administered 2019-01-09 (×2): 1 mg via INTRAVENOUS
  Filled 2019-01-09 (×2): qty 2

## 2019-01-09 MED ORDER — MIDAZOLAM HCL 2 MG/2ML IJ SOLN: 1.0000 mg | INTRAMUSCULAR | Status: DC | PRN

## 2019-01-09 MED ORDER — ETOMIDATE 2 MG/ML IV SOLN
20.0000 mg | Freq: Once | INTRAVENOUS | Status: AC
  Administered 2019-01-09: 20 mg via INTRAVENOUS

## 2019-01-09 MED ORDER — VANCOMYCIN HCL 500 MG IV SOLR
500.0000 mg | Freq: Once | INTRAVENOUS | Status: AC
  Administered 2019-01-09: 500 mg via INTRAVENOUS
  Filled 2019-01-09: qty 500

## 2019-01-09 MED ORDER — CHLORHEXIDINE GLUCONATE 0.12% ORAL RINSE (MEDLINE KIT)
15.0000 mL | Freq: Two times a day (BID) | OROMUCOSAL | Status: DC
  Administered 2019-01-09 – 2019-01-14 (×10): 15 mL via OROMUCOSAL

## 2019-01-09 MED ORDER — PROPOFOL 1000 MG/100ML IV EMUL: 0.0000 ug/kg/min | INTRAVENOUS | Status: DC

## 2019-01-09 MED ORDER — ENOXAPARIN SODIUM 40 MG/0.4ML ~~LOC~~ SOLN
40.0000 mg | SUBCUTANEOUS | Status: DC
  Administered 2019-01-09 – 2019-01-11 (×3): 40 mg via SUBCUTANEOUS
  Filled 2019-01-09 (×3): qty 0.4

## 2019-01-09 MED ORDER — FENTANYL CITRATE (PF) 100 MCG/2ML IJ SOLN: 50.0000 ug | INTRAMUSCULAR | Status: DC | PRN

## 2019-01-09 MED ORDER — DEXTROSE 50 % IV SOLN: 1.0000 | Freq: Once | INTRAVENOUS | Status: DC

## 2019-01-09 MED ORDER — SODIUM CHLORIDE 0.9 % IV BOLUS: 500.0000 mL | Freq: Once | INTRAVENOUS | Status: DC

## 2019-01-09 MED ORDER — VANCOMYCIN HCL IN DEXTROSE 1-5 GM/200ML-% IV SOLN
1000.0000 mg | Freq: Once | INTRAVENOUS | Status: AC
  Administered 2019-01-09: 1000 mg via INTRAVENOUS
  Filled 2019-01-09: qty 200

## 2019-01-09 MED ORDER — DEXTROSE-NACL 5-0.9 % IV SOLN
INTRAVENOUS | Status: DC
  Administered 2019-01-09: 21:00:00 via INTRAVENOUS

## 2019-01-09 MED ORDER — FUROSEMIDE 10 MG/ML IJ SOLN
40.0000 mg | Freq: Once | INTRAMUSCULAR | Status: AC
  Administered 2019-01-09: 40 mg via INTRAVENOUS
  Filled 2019-01-09: qty 4

## 2019-01-09 MED ORDER — SODIUM CHLORIDE 0.9 % IV SOLN: INTRAVENOUS | Status: DC

## 2019-01-09 MED ORDER — FENTANYL CITRATE (PF) 100 MCG/2ML IJ SOLN
50.0000 ug | INTRAMUSCULAR | Status: DC | PRN
  Administered 2019-01-09: 50 ug via INTRAVENOUS
  Filled 2019-01-09: qty 2

## 2019-01-09 MED ORDER — IPRATROPIUM-ALBUTEROL 0.5-2.5 (3) MG/3ML IN SOLN: 3.0000 mL | RESPIRATORY_TRACT | Status: DC | PRN

## 2019-01-09 NOTE — ED Notes (Signed)
CRITICAL VALUE ALERT  Critical Value:  TO2 from blood gas 34.9  Date & Time Notied:  1551, 01/09/2019  Provider Notified: Dr. Gilford Raid  Orders Received/Actions taken: no new orders

## 2019-01-09 NOTE — ED Provider Notes (Signed)
  Physical Exam  BP 130/86   Pulse (!) 106   Temp 98.6 F (37 C)   Resp (!) 29   SpO2 92%   Physical Exam  ED Course/Procedures     Procedure Name: Intubation Date/Time: 01/09/2019 6:04 PM Performed by: Isla Pence, MD Pre-anesthesia Checklist: Patient identified, Patient being monitored, Emergency Drugs available, Timeout performed and Suction available Oxygen Delivery Method: Ambu bag Preoxygenation: Pre-oxygenation with 100% oxygen Induction Type: Rapid sequence Ventilation: Mask ventilation without difficulty Laryngoscope Size: Glidescope and 3 Number of attempts: 1 Placement Confirmation: ETT inserted through vocal cords under direct vision,  Breath sounds checked- equal and bilateral and Positive ETCO2 Secured at: 24 cm Tube secured with: ETT holder Dental Injury: Teeth and Oropharynx as per pre-operative assessment  Difficulty Due To: Difficulty was anticipated and Difficult Airway- due to reduced neck mobility       MDM         Isla Pence, MD 01/09/19 1806
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
## 2019-01-09 NOTE — ED Notes (Signed)
Color changed noted

## 2019-01-09 NOTE — ED Notes (Signed)
Patient denies pain and is resting comfortably.  

## 2019-01-09 NOTE — Progress Notes (Signed)
Patient's systolic BP 64'H, HR low 539'N. Patient getting agitated and has not had sedatives in 2 hours d/t BP. Mid level aware.

## 2019-01-09 NOTE — ED Provider Notes (Signed)
[facility] EMERGENCY DEPARTMENT Provider Note   CSN: [id number] Arrival date & time: 01/09/19  1040    History   Chief Complaint Chief Complaint  Patient presents with  . Recurrent UTI  . Altered Mental Status    HPI John Munoz is a 85 y.o. male.     HPI  Past Medical History:  Diagnosis Date  . Arthritis   . BPH (benign prostatic hyperplasia)   . Depression   . Gout   . Mental disorder     There are no active problems to display for this patient.   Past Surgical History:  Procedure Laterality Date  . BACK SURGERY     lumbar laminectomy  . CATARACT EXTRACTION W/PHACO  07/08/2012   Procedure: CATARACT EXTRACTION PHACO AND INTRAOCULAR LENS PLACEMENT (IOC);  Surgeon: Tonny Branch, MD;  Location: [facility];  Service: Ophthalmology;  Laterality: Left;  CDE 25.22  . CHOLECYSTECTOMY     MMH        Home Medications    Prior to Admission medications   Medication Sig Start Date End Date Taking? Authorizing Provider  acetaminophen (TYLENOL) 500 MG tablet Take 1,000 mg by mouth every 6 (six) hours as needed. Pain   Yes [provider]  allopurinol (ZYLOPRIM) 300 MG tablet Take 300 mg by mouth daily.   Yes [provider]  aspirin 81 MG chewable tablet Chew 81 mg by mouth daily.   Yes [provider]  busPIRone (BUSPAR) 15 MG tablet Take 15 mg by mouth 2 (two) times daily.   Yes [provider]  calcium gluconate 500 MG tablet Take 1 tablet by mouth daily.   Yes [provider]  citalopram (CELEXA) 20 MG tablet Take 20 mg by mouth daily.   Yes [provider]  folic acid (FOLVITE) 1 MG tablet Take 1 mg by mouth daily.   Yes [provider]  gabapentin (NEURONTIN) 300 MG capsule Take 300 mg by mouth 3 (three) times daily.   Yes [provider]  Melatonin 5 MG TABS Take 1 tablet by mouth at bedtime.   Yes [provider]  midodrine (PROAMATINE) 2.5 MG tablet Take 2.5 mg by mouth 2 (two) times  a day.   Yes [provider]  Omega-3 Fatty Acids (FISH OIL) 1000 MG CAPS Take 1 capsule by mouth 2 (two) times a day.   Yes [provider]  omeprazole (PRILOSEC) 20 MG capsule Take 20 mg by mouth daily.   Yes [provider]  Tamsulosin HCl (FLOMAX) 0.4 MG CAPS Take 0.4 mg by mouth daily.    Yes [provider]  traMADol (ULTRAM) 50 MG tablet Take 50 mg by mouth 3 (three) times daily.   Yes [provider]  vitamin B-12 (CYANOCOBALAMIN) 1000 MCG tablet Take 1,000 mcg by mouth daily.   Yes [provider]  ciprofloxacin (CIPRO) 500 MG tablet Take 500 mg by mouth 2 (two) times daily.    [provider]    Family History History reviewed. No pertinent family history.  Social History Social History   Tobacco Use  . Smoking status: Current Every Day Smoker    Packs/day: 1.50    Years: 60.00    Pack years: 90.00    Types: Cigarettes  . Smokeless tobacco: Never Used  Substance Use Topics  . Alcohol use: No  . Drug use: No     Allergies   Patient has no known allergies.   Review of Systems Review  of Systems   Physical Exam Updated Vital Signs BP 106/77   Pulse 90   Temp (!) 95.4 F (35.2 C)   Resp 19   SpO2 95%   Physical Exam   ED Treatments / Results  Labs (all labs ordered are listed, but only abnormal results are displayed) Labs Reviewed  COMPREHENSIVE METABOLIC PANEL - Abnormal; Notable for the following components:      Result Value   Chloride 97 (*)    Glucose, Bld 125 (*)    BUN 25 (*)    Creatinine, Ser 0.54 (*)    Total Protein 6.0 (*)    Albumin 3.0 (*)    AST 63 (*)    ALT 49 (*)    All other components within normal limits  CBC WITH DIFFERENTIAL/PLATELET - Abnormal; Notable for the following components:   WBC 3.7 (*)    RBC 2.37 (*)    Hemoglobin 8.0 (*)    HCT 23.7 (*)    RDW 15.6 (*)    Platelets 101 (*)    Lymphs Abs 0.5 (*)    All other components within normal limits   URINALYSIS, ROUTINE W REFLEX MICROSCOPIC - Abnormal; Notable for the following components:   Leukocytes,Ua TRACE (*)    Bacteria, UA RARE (*)    All other components within normal limits  BRAIN NATRIURETIC PEPTIDE - Abnormal; Notable for the following components:   B Natriuretic Peptide 1,175.0 (*)    All other components within normal limits  CBG MONITORING, ED - Abnormal; Notable for the following components:   Glucose-Capillary 55 (*)    All other components within normal limits  CBG MONITORING, ED - Abnormal; Notable for the following components:   Glucose-Capillary 101 (*)    All other components within normal limits  CULTURE, BLOOD (ROUTINE X 2)  CULTURE, BLOOD (ROUTINE X 2)  LACTIC ACID, PLASMA  LACTIC ACID, PLASMA  POC OCCULT BLOOD, ED  TYPE AND SCREEN   Results for orders placed or performed during the hospital encounter of 01/09/19  Blood Culture (routine x 2)  Result Value Ref Range   Specimen Description BLOOD LEFT ARM BOTTLES DRAWN AEROBIC AND ANAEROBIC    Special Requests      Blood Culture adequate volume Performed at [facility], [address], [place], [place] [address]    Culture PENDING    Report Status PENDING   Blood Culture (routine x 2)  Result Value Ref Range   Specimen Description      LEFT ANTECUBITAL BOTTLES DRAWN AEROBIC AND ANAEROBIC   Special Requests      Blood Culture adequate volume Performed at [facility], [address], [place], [place] [address]    Culture PENDING    Report Status PENDING   Comprehensive metabolic panel  Result Value Ref Range   Sodium 136 135 - 145 mmol/L   Potassium 4.3 3.5 - 5.1 mmol/L   Chloride 97 (L) 98 - 111 mmol/L   CO2 29 22 - 32 mmol/L   Glucose, Bld 125 (H) 70 - 99 mg/dL   BUN 25 (H) 8 - 23 mg/dL   Creatinine, Ser 0.54 (L) 0.61 - 1.24 mg/dL   Calcium 8.9 8.9 - 10.3 mg/dL   Total Protein 6.0 (L) 6.5 - 8.1 g/dL   Albumin 3.0 (L) 3.5 - 5.0 g/dL   AST 63 (H) 15 - 41 U/L   ALT 49 (H) 0 - 44 U/L    Alkaline Phosphatase 106 38 - 126 U/L  Total Bilirubin 0.5 0.3 - 1.2 mg/dL   GFR calc non Af Amer >60 >60 mL/min   GFR calc Af Amer >60 >60 mL/min   Anion gap 10 5 - 15  Lactic acid, plasma  Result Value Ref Range   Lactic Acid, Venous 0.9 0.5 - 1.9 mmol/L  Lactic acid, plasma  Result Value Ref Range   Lactic Acid, Venous 0.6 0.5 - 1.9 mmol/L  CBC WITH DIFFERENTIAL  Result Value Ref Range   WBC 3.7 (L) 4.0 - 10.5 K/uL   RBC 2.37 (L) 4.22 - 5.81 MIL/uL   Hemoglobin 8.0 (L) 13.0 - 17.0 g/dL   HCT 23.7 (L) 39.0 - 52.0 %   MCV 100.0 80.0 - 100.0 fL   MCH 33.8 26.0 - 34.0 pg   MCHC 33.8 30.0 - 36.0 g/dL   RDW 15.6 (H) 11.5 - 15.5 %   Platelets 101 (L) 150 - 400 K/uL   nRBC 0.0 0.0 - 0.2 %   Neutrophils Relative % 78 %   Neutro Abs 2.9 1.7 - 7.7 K/uL   Lymphocytes Relative 14 %   Lymphs Abs 0.5 (L) 0.7 - 4.0 K/uL   Monocytes Relative 7 %   Monocytes Absolute 0.2 0.1 - 1.0 K/uL   Eosinophils Relative 1 %   Eosinophils Absolute 0.0 0.0 - 0.5 K/uL   Basophils Relative 0 %   Basophils Absolute 0.0 0.0 - 0.1 K/uL   WBC Morphology INCREASED BANDS (>20% BANDS)    Immature Granulocytes 0 %   Abs Immature Granulocytes 0.00 0.00 - 0.07 K/uL  Urinalysis, Routine w reflex microscopic  Result Value Ref Range   Color, Urine YELLOW YELLOW   APPearance CLEAR CLEAR   Specific Gravity, Urine 1.017 1.005 - 1.030   pH 5.0 5.0 - 8.0   Glucose, UA NEGATIVE NEGATIVE mg/dL   Hgb urine dipstick NEGATIVE NEGATIVE   Bilirubin Urine NEGATIVE NEGATIVE   Ketones, ur NEGATIVE NEGATIVE mg/dL   Protein, ur NEGATIVE NEGATIVE mg/dL   Nitrite NEGATIVE NEGATIVE   Leukocytes,Ua TRACE (A) NEGATIVE   RBC / HPF 0-5 0 - 5 RBC/hpf   WBC, UA 11-20 0 - 5 WBC/hpf   Bacteria, UA RARE (A) NONE SEEN   Squamous Epithelial / LPF 0-5 0 - 5   Trans Epithel, UA 1    Mucus PRESENT    Hyaline Casts, UA PRESENT   Brain natriuretic peptide  Result Value Ref Range   B Natriuretic Peptide 1,175.0 (H) 0.0 - 100.0 pg/mL   CBG monitoring, ED  Result Value Ref Range   Glucose-Capillary 55 (L) 70 - 99 mg/dL  CBG monitoring, ED  Result Value Ref Range   Glucose-Capillary 101 (H) 70 - 99 mg/dL     EKG EKG Interpretation  Date/Time:  Sunday Jan 09 2019 11:12:19 EDT Ventricular Rate:  75 PR Interval:    QRS Duration: 120 QT Interval:  437 QTC Calculation: 489 R Axis:   25 Text Interpretation:  Sinus rhythm Prolonged PR interval Nonspecific intraventricular conduction delay Interpretation limited secondary to artifact Confirmed by Fredia Sorrow [phone]) on 01/09/2019 11:35:33 AM   Radiology Dg Chest Port 1 View  Result Date: 01/09/2019 CLINICAL DATA:  Altered mental status. EXAM: PORTABLE CHEST 1 VIEW COMPARISON:  Radiographs of December 01, 2018. FINDINGS: Stable cardiomegaly. Atherosclerosis of thoracic aorta is noted. No pneumothorax is noted. Bilateral interstitial densities are noted concerning for pulmonary edema. Mild left pleural effusion is noted with probable associated atelectasis or infiltrate. Bony thorax is unremarkable. IMPRESSION:  Stable cardiomegaly with bilateral interstitial densities concerning for edema. Mild left pleural effusion is noted with associated atelectasis or infiltrate. Aortic Atherosclerosis (ICD10-I70.0). Electronically Signed   By: Marijo Conception M.D.   On: 01/09/2019 11:50    Procedures Procedures (including critical care time)  CRITICAL CARE Performed by: Fredia Sorrow Total critical care time: 30 minutes Critical care time was exclusive of separately billable procedures and treating other patients. Critical care was necessary to treat or prevent imminent or life-threatening deterioration. Critical care was time spent personally by me on the following activities: development of treatment plan with patient and/or surrogate as well as nursing, discussions with consultants, evaluation of patient's response to treatment, examination of patient, obtaining history from  patient or surrogate, ordering and performing treatments and interventions, ordering and review of laboratory studies, ordering and review of radiographic studies, pulse oximetry and re-evaluation of patient's condition.   Medications Ordered in ED Medications  dextrose 5 %-0.9 % sodium chloride infusion ( Intravenous New Bag/Given 01/09/19 1239)  ceFEPIme (MAXIPIME) 2 g in sodium chloride 0.9 % 100 mL IVPB (has no administration in time range)  vancomycin (VANCOCIN) 500 mg in sodium chloride 0.9 % 100 mL IVPB (500 mg Intravenous New Bag/Given 01/09/19 1345)  vancomycin (VANCOCIN) 1,500 mg in sodium chloride 0.9 % 500 mL IVPB (has no administration in time range)  sodium chloride 0.9 % bolus 500 mL (0 mLs Intravenous Hold 01/09/19 1345)  furosemide (LASIX) injection 40 mg (has no administration in time range)  iohexol (OMNIPAQUE) 300 MG/ML solution 75 mL (has no administration in time range)  dextrose 50 % solution 12.5 g (12.5 g Intravenous Given 01/09/19 1101)  ceFEPIme (MAXIPIME) 2 g in sodium chloride 0.9 % 100 mL IVPB (0 g Intravenous Stopped 01/09/19 1214)  metroNIDAZOLE (FLAGYL) IVPB 500 mg (0 mg Intravenous Stopped 01/09/19 1239)  vancomycin (VANCOCIN) IVPB 1000 mg/200 mL premix (0 mg Intravenous Stopped 01/09/19 1336)  sodium chloride 0.9 % bolus 500 mL (0 mLs Intravenous Stopped 01/09/19 1214)     Initial Impression / Assessment and Plan / ED Course  I have reviewed the triage vital signs and the nursing notes.  Pertinent labs & imaging results that were available during my care of the patient were reviewed by me and considered in my medical decision making (see chart for details).       Patient brought in by EMS.  Patient lives with his niece.  Patient arrived seem to be very uncared for.  EMS reported that he was covered in urine at home.  Report was that he had a urinary tract infection for the past 3 weeks and just finished antibiotics.  Aide that helps him states that since Friday  he has been altered.  Patient normally ambulates with a walker.  Temperature upon arrival was 91.  He was technically hypothermic.  Blood pressure was 694 systolic heart rate was 77 respirations were 20 and oxygen saturations on room air 93%.  Patient also had a bit of a raspy cough and sounded as if maybe had some fluids on in the lungs.  Based on all this patient was started on sepsis protocol.  Received broad-spectrum antibiotics.  Initial lactic acid was normal.  Patient's urinalysis did have 11-20 white blood cell count.  Was nitrite negative.  Rare bacteria.  Culture has been sent.  Chest x-ray showed evidence of pulmonary edema and a left-sided pleural effusion may be atelectasis or infiltrate in that area.  This time went on.  Patient  initially received 500 cc bolus patient seemed to get a little more congested and seemed to be getting a little better.  Oxygen saturations have been fine.  Initial lactic acid and repeat lactic acid all normal.  Patient's BNP was markedly elevated above the thousand.  Felt that a lot of patient's problem is some pulmonary edema.  So he received 40 mg of Lasix for this.  Due to the confusion and patient verbalizing only minimal patient has CT head ordered.  And is pending.  Also added on CT chest with contrast to further evaluate the lung situation.   Patient's oxygen saturations have been fine.  We had a couple blood pressures that were lower than 90.  But these may been erroneous.  Because the current trend is that his blood pressures are around 270 systolic.  Patient will require admission.  Patient may require to go on BiPAP if COVID negative.  Possibly could require intubation but at this point in time does not require.  Based on his sats.  Patient is  Patient also had hypoglycemia with a blood sugar of 50 and received half amp of D50 which brought his blood sugar up to 101.  And patient started on D5 normal saline to help supplement that.   Following  CTs COVID test will be ordered.  Patient will require admission.   Final Clinical Impressions(s) / ED Diagnoses   Final diagnoses:  Altered mental status, unspecified altered mental status type  Hypothermic shock, initial encounter  Sepsis, due to unspecified organism, unspecified whether acute organ dysfunction present [facility])  Pleural effusion, left  Acute pulmonary edema [facility])    ED Discharge Orders    None       Fredia Sorrow, MD 01/09/19 1445

## 2019-01-09 NOTE — ED Notes (Signed)
RT at bedside.

## 2019-01-09 NOTE — ED Triage Notes (Signed)
Pt been treated for a UTI for 3 weeks.  Just finished ABX. Aid that helps him states since Friday, pt has been altered.  Pt normally A/o ambulates with walker.

## 2019-01-09 NOTE — ED Provider Notes (Signed)
Pt signed out by Dr. Rogene Houston pending labs/CT.     IMPRESSION:  Bilateral pleural effusions left greater than right.    Left lower lobe infiltrate consistent with acute pneumonia. Mild  right basilar atelectasis is seen.    Aortic Atherosclerosis (ICD10-I70.0).     Pt given IV abx already by Dr. Rogene Houston.  I performed a stool guaiac which was negative.    Nurse made a consult to APS due to EMS report of a very dirty home and the pt lying in urine.  Pt placed on 4L oxygen, and when his covid came back negative; he was put on bipap.  Pt d/w his niece who said he was admitted to [facility] about 3 weeks ago for a UTI and AMS.  We will ask for records from that admission.  She said he has not gotten much better.  She said she's been trying to keep him cleaned up, but he has been fighting with her.  ABGs done by RT are possibly venous.  We will see if bipap helps ms.  The niece does want him intubated if needed.  Pt d/w Dr. Denton Brick (triad) for admission.  By the time the pt was seen by Dr. Denton Brick, he had time on the bipap to improve if he was going to improve.  He did not.  MS is still very poor.  He was then intubated.  See additional note.  Pt's niece updated.  GLENDEN ROSSELL was evaluated in Emergency Department on 01/09/2019 for the symptoms described in the history of present illness. He was evaluated in the context of the global COVID-19 pandemic, which necessitated consideration that the patient might be at risk for infection with the SARS-CoV-2 virus that causes COVID-19. Institutional protocols and algorithms that pertain to the evaluation of patients at risk for COVID-19 are in a state of rapid change based on information released by regulatory bodies including the CDC and federal and state organizations. These policies and algorithms were followed during the patient's care in the ED.  CRITICAL CARE Performed by: Isla Pence   Total critical care time: 60  minutes  Critical care time was exclusive of separately billable procedures and treating other patients.  Critical care was necessary to treat or prevent imminent or life-threatening deterioration.  Critical care was time spent personally by me on the following activities: development of treatment plan with patient and/or surrogate as well as nursing, discussions with consultants, evaluation of patient's response to treatment, examination of patient, obtaining history from patient or surrogate, ordering and performing treatments and interventions, ordering and review of laboratory studies, ordering and review of radiographic studies, pulse oximetry and re-evaluation of patient's condition.   Isla Pence, MD 01/09/19 1807

## 2019-01-09 NOTE — Progress Notes (Signed)
Pharmacy Antibiotic Note  John Munoz is a 85 y.o. male admitted on 01/09/2019 with infection- unknown source.  Pharmacy has been consulted for Vancomycin and Cefepime dosing.  Plan: Vancomycin 1500 mg IV every 24 hours.  Goal trough 15-20 mcg/mL.  Cefepime 2000 mg IV every 8 hours. Monitor labs, c/s, and vanco levels as indicated.     Temp (24hrs), Avg:91.3 F (32.9 C), Min:91.1 F (32.8 C), Max:91.4 F (33 C)  Recent Labs  Lab 01/09/19 1113  WBC 3.7*  CREATININE 0.54*  LATICACIDVEN 0.9    CrCl cannot be calculated (Unknown ideal weight.).    No Known Allergies  Antimicrobials this admission: Vanco 5/10 >>  Cefepime 5/10 >>  Flagyl 5/10 >>  Dose adjustments this admission: [place]  Microbiology results: 5/10 BCx: pending   Thank you for allowing pharmacy to be a part of this patient's care.  Ramond Craver 01/09/2019 12:30 PM

## 2019-01-10 ENCOUNTER — Inpatient Hospital Stay (HOSPITAL_COMMUNITY): Payer: Medicare Other

## 2019-01-10 DIAGNOSIS — Z515 Encounter for palliative care: Secondary | ICD-10-CM

## 2019-01-10 DIAGNOSIS — J9601 Acute respiratory failure with hypoxia: Secondary | ICD-10-CM

## 2019-01-10 DIAGNOSIS — I959 Hypotension, unspecified: Secondary | ICD-10-CM | POA: Diagnosis present

## 2019-01-10 DIAGNOSIS — I509 Heart failure, unspecified: Secondary | ICD-10-CM

## 2019-01-10 DIAGNOSIS — R945 Abnormal results of liver function studies: Secondary | ICD-10-CM

## 2019-01-10 DIAGNOSIS — Z7189 Other specified counseling: Secondary | ICD-10-CM

## 2019-01-10 DIAGNOSIS — A419 Sepsis, unspecified organism: Secondary | ICD-10-CM

## 2019-01-10 DIAGNOSIS — D696 Thrombocytopenia, unspecified: Secondary | ICD-10-CM

## 2019-01-10 DIAGNOSIS — R4182 Altered mental status, unspecified: Secondary | ICD-10-CM

## 2019-01-10 DIAGNOSIS — L8990 Pressure ulcer of unspecified site, unspecified stage: Secondary | ICD-10-CM

## 2019-01-10 DIAGNOSIS — G9341 Metabolic encephalopathy: Secondary | ICD-10-CM

## 2019-01-10 LAB — HEPATIC FUNCTION PANEL
ALT: 47 U/L — ABNORMAL HIGH (ref 0–44)
AST: 72 U/L — ABNORMAL HIGH (ref 15–41)
Albumin: 2.7 g/dL — ABNORMAL LOW (ref 3.5–5.0)
Alkaline Phosphatase: 95 U/L (ref 38–126)
Bilirubin, Direct: 0.1 mg/dL (ref 0.0–0.2)
Indirect Bilirubin: 0.4 mg/dL (ref 0.3–0.9)
Total Bilirubin: 0.5 mg/dL (ref 0.3–1.2)
Total Protein: 5.4 g/dL — ABNORMAL LOW (ref 6.5–8.1)

## 2019-01-10 LAB — CBC
HCT: 21.2 % — ABNORMAL LOW (ref 39.0–52.0)
Hemoglobin: 7.2 g/dL — ABNORMAL LOW (ref 13.0–17.0)
MCH: 33.8 pg (ref 26.0–34.0)
MCHC: 34 g/dL (ref 30.0–36.0)
MCV: 99.5 fL (ref 80.0–100.0)
Platelets: 84 10*3/uL — ABNORMAL LOW (ref 150–400)
RBC: 2.13 MIL/uL — ABNORMAL LOW (ref 4.22–5.81)
RDW: 16.1 % — ABNORMAL HIGH (ref 11.5–15.5)
WBC: 4.5 10*3/uL (ref 4.0–10.5)
nRBC: 0 % (ref 0.0–0.2)

## 2019-01-10 LAB — BASIC METABOLIC PANEL
Anion gap: 9 (ref 5–15)
BUN: 26 mg/dL — ABNORMAL HIGH (ref 8–23)
CO2: 26 mmol/L (ref 22–32)
Calcium: 8.4 mg/dL — ABNORMAL LOW (ref 8.9–10.3)
Chloride: 103 mmol/L (ref 98–111)
Creatinine, Ser: 0.88 mg/dL (ref 0.61–1.24)
GFR calc Af Amer: >60 mL/min (ref >60)
GFR calc non Af Amer: >60 mL/min (ref >60)
Glucose, Bld: 69 mg/dL — ABNORMAL LOW (ref 70–99)
Potassium: 3.8 mmol/L (ref 3.5–5.1)
Sodium: 138 mmol/L (ref 135–145)

## 2019-01-10 LAB — IRON AND TIBC
Iron: 75 ug/dL (ref 45–182)
Saturation Ratios: 27 % (ref 17.9–39.5)
TIBC: 277 ug/dL (ref 250–450)
UIBC: 202 ug/dL

## 2019-01-10 LAB — GLUCOSE, CAPILLARY
Glucose-Capillary: 73 mg/dL (ref 70–99)
Glucose-Capillary: 82 mg/dL (ref 70–99)
Glucose-Capillary: 85 mg/dL (ref 70–99)
Glucose-Capillary: 97 mg/dL (ref 70–99)
Glucose-Capillary: 108 mg/dL — ABNORMAL HIGH (ref 70–99)

## 2019-01-10 LAB — TROPONIN I
Troponin I: 0.46 ng/mL (ref <0.03)
Troponin I: 0.54 ng/mL (ref <0.03)
Troponin I: 0.78 ng/mL (ref <0.03)
Troponin I: 0.89 ng/mL (ref <0.03)
Troponin I: 0.93 ng/mL (ref <0.03)

## 2019-01-10 LAB — CORTISOL: Cortisol, Plasma: 12 ug/dL

## 2019-01-10 LAB — FIBRINOGEN: Fibrinogen: 435 mg/dL (ref 210–475)

## 2019-01-10 LAB — PREPARE RBC (CROSSMATCH)

## 2019-01-10 LAB — APTT: aPTT: 46 seconds — ABNORMAL HIGH (ref 24–36)

## 2019-01-10 LAB — PROTIME-INR
INR: 1.1 (ref 0.8–1.2)
Prothrombin Time: 14.2 seconds (ref 11.4–15.2)

## 2019-01-10 LAB — ABO/RH: ABO/RH(D): AB POS

## 2019-01-10 LAB — BLOOD GAS, ARTERIAL
Acid-Base Excess: 3.5 mmol/L — ABNORMAL HIGH (ref 0.0–2.0)
Bicarbonate: 27.5 mmol/L (ref 20.0–28.0)
FIO2: 40
O2 Saturation: 87.1 %
Patient temperature: 37.4
pCO2 arterial: 36.3 mmHg (ref 32.0–48.0)
pH, Arterial: 7.483 — ABNORMAL HIGH (ref 7.350–7.450)
pO2, Arterial: 52.9 mmHg — ABNORMAL LOW (ref 83.0–108.0)

## 2019-01-10 LAB — STREP PNEUMONIAE URINARY ANTIGEN: Strep Pneumo Urinary Antigen: NEGATIVE

## 2019-01-10 LAB — MRSA PCR SCREENING: MRSA by PCR: POSITIVE — AB

## 2019-01-10 LAB — SEDIMENTATION RATE: Sed Rate: 40 mm/hr — ABNORMAL HIGH (ref 0–16)

## 2019-01-10 LAB — FERRITIN: Ferritin: 266 ng/mL (ref 24–336)

## 2019-01-10 LAB — VITAMIN B12: Vitamin B-12: 1331 pg/mL — ABNORMAL HIGH (ref 180–914)

## 2019-01-10 LAB — PROCALCITONIN: Procalcitonin: <0.1 ng/mL

## 2019-01-10 LAB — HEMOGLOBIN AND HEMATOCRIT, BLOOD
HCT: 29.1 % — ABNORMAL LOW (ref 39.0–52.0)
Hemoglobin: 9.5 g/dL — ABNORMAL LOW (ref 13.0–17.0)

## 2019-01-10 LAB — FOLATE: Folate: 18.8 ng/mL (ref >5.9)

## 2019-01-10 LAB — OCCULT BLOOD X 1 CARD TO LAB, STOOL: Fecal Occult Bld: NEGATIVE

## 2019-01-10 MED ORDER — COLLAGENASE 250 UNIT/GM EX OINT
TOPICAL_OINTMENT | Freq: Every day | CUTANEOUS | Status: AC
  Administered 2019-01-10: 1 via TOPICAL
  Administered 2019-01-11 – 2019-01-19 (×9): via TOPICAL
  Filled 2019-01-10: qty 30

## 2019-01-10 MED ORDER — ONDANSETRON HCL 4 MG/2ML IJ SOLN: 4.0000 mg | Freq: Four times a day (QID) | INTRAMUSCULAR | Status: DC | PRN

## 2019-01-10 MED ORDER — KCL-LACTATED RINGERS-D5W 20 MEQ/L IV SOLN
INTRAVENOUS | Status: DC
  Administered 2019-01-10 – 2019-01-11 (×2): via INTRAVENOUS
  Filled 2019-01-10 (×4): qty 1000

## 2019-01-10 MED ORDER — ACETAMINOPHEN 325 MG PO TABS
650.0000 mg | ORAL_TABLET | Freq: Four times a day (QID) | ORAL | Status: DC | PRN
  Administered 2019-01-17 – 2019-01-18 (×2): 650 mg via ORAL
  Filled 2019-01-10 (×2): qty 2

## 2019-01-10 MED ORDER — NOREPINEPHRINE 4 MG/250ML-% IV SOLN
0.0000 ug/min | INTRAVENOUS | Status: DC
  Administered 2019-01-10: 2 ug/min via INTRAVENOUS
  Filled 2019-01-10: qty 250

## 2019-01-10 MED ORDER — SODIUM CHLORIDE 0.9% FLUSH
10.0000 mL | INTRAVENOUS | Status: DC | PRN
  Administered 2019-01-10: 10 mL
  Filled 2019-01-10: qty 40

## 2019-01-10 MED ORDER — SODIUM CHLORIDE 0.9 % IV BOLUS
500.0000 mL | Freq: Once | INTRAVENOUS | Status: AC
  Administered 2019-01-10: 500 mL via INTRAVENOUS

## 2019-01-10 MED ORDER — MUPIROCIN 2 % EX OINT
1.0000 "application " | TOPICAL_OINTMENT | Freq: Two times a day (BID) | CUTANEOUS | Status: DC
  Administered 2019-01-10 – 2019-01-14 (×9): 1 via NASAL
  Filled 2019-01-10: qty 22

## 2019-01-10 MED ORDER — CHLORHEXIDINE GLUCONATE CLOTH 2 % EX PADS
6.0000 | MEDICATED_PAD | Freq: Every day | CUTANEOUS | Status: DC
  Administered 2019-01-10 – 2019-01-20 (×10): 6 via TOPICAL

## 2019-01-10 MED ORDER — DIPHENHYDRAMINE HCL 50 MG/ML IJ SOLN
25.0000 mg | Freq: Once | INTRAMUSCULAR | Status: AC
  Administered 2019-01-10: 25 mg via INTRAVENOUS
  Filled 2019-01-10: qty 1

## 2019-01-10 MED ORDER — CHLORHEXIDINE GLUCONATE CLOTH 2 % EX PADS
6.0000 | MEDICATED_PAD | Freq: Every day | CUTANEOUS | Status: DC
  Administered 2019-01-10 – 2019-01-14 (×5): 6 via TOPICAL

## 2019-01-10 MED ORDER — SODIUM CHLORIDE 0.9% IV SOLUTION
Freq: Once | INTRAVENOUS | Status: AC
  Administered 2019-01-10: 11:00:00 via INTRAVENOUS

## 2019-01-10 MED ORDER — SODIUM CHLORIDE 0.9 % IV BOLUS
1000.0000 mL | Freq: Once | INTRAVENOUS | Status: AC
  Administered 2019-01-10: 1000 mL via INTRAVENOUS

## 2019-01-10 MED ORDER — HYDROCORTISONE NA SUCCINATE PF 100 MG IJ SOLR
50.0000 mg | Freq: Three times a day (TID) | INTRAMUSCULAR | Status: DC
  Administered 2019-01-10 – 2019-01-11 (×4): 50 mg via INTRAVENOUS
  Filled 2019-01-10 (×4): qty 2

## 2019-01-10 MED ORDER — SODIUM CHLORIDE 0.9% FLUSH
10.0000 mL | Freq: Two times a day (BID) | INTRAVENOUS | Status: DC
  Administered 2019-01-10 – 2019-01-18 (×18): 10 mL
  Administered 2019-01-19: 20 mL
  Administered 2019-01-19 – 2019-01-20 (×2): 10 mL

## 2019-01-10 MED ORDER — VANCOMYCIN HCL 1.5 G IV SOLR
1500.0000 mg | INTRAVENOUS | Status: AC
  Administered 2019-01-10 – 2019-01-12 (×3): 1500 mg via INTRAVENOUS
  Filled 2019-01-10 (×4): qty 1500

## 2019-01-10 MED ORDER — ASPIRIN 300 MG RE SUPP
300.0000 mg | Freq: Once | RECTAL | Status: AC
  Administered 2019-01-10: 300 mg via RECTAL
  Filled 2019-01-10: qty 1

## 2019-01-10 NOTE — Progress Notes (Addendum)
Xcover Per RN bp 46/42  Hr 100  Pt is currently on vent for acute respiratory failure secondary to pneumonia. Unable to provide history  Exam T 99.2  P 100  Bp 79/62  Pox 91% on vent  Wt 73kg  Heent: anicteric intubated Neck: no jvd Heart: borderline tachy s1, s2, 2/6 sem rusb/ apex Lung: bilateral rhonchi Abd: soft, obese, nt, +bs Ext: trace edema   EKG (prior), reviewed, nsr at 75, nl axis, nl int, no st-t changes c/w ishcemia Trop <0.03  A/P Hypotension secondary to sepsis, HCAP  Tele Trop I  Cortisol level Check cardiac echo as already ordered Ns 540mL iv x1 bolus Continue iv abx Pt may be developing early sepsis (hypotension, tachycardia, pancytopenia, abnormal liver function)  secondary to Hcap  Abnormal liver function Check acute hepatitis panel Check RUQ ultrasound Check LFT in am  Pancytopenia Check cbc in am   Pt is severely ill with hypotension, Hcap, abnormal liver function, pancytopenia, very high morbidity associated with this constellation of symptoms  Critical care time 30 minutes

## 2019-01-10 NOTE — Progress Notes (Signed)
CRITICAL VALUE ALERT  Critical Value:  Trop 0.54  Date & Time Notied:  01/10/2019 0730  Provider Notified: Tat,MD  Orders Received/Actions taken: awaiting further instructions

## 2019-01-10 NOTE — Progress Notes (Signed)
Pt continuously jerking right arm and trying to move left hand to get to cords/vent. Wrist restraints reordered per protocol- Fentanyl 76mcg given per PRN order to see if that helps with pt comfort. Will continue to monitor pt.

## 2019-01-10 NOTE — Progress Notes (Signed)
Blood consent obtained by this RN from patient's niece, Billyjo Lemar Livings. Consent for blood was verified by second RN, Perfecto Kingdom.

## 2019-01-10 NOTE — Progress Notes (Signed)
Pt's niece, Mal Amabile, gave verbal consent for central line placement. RN Benjamine Mola verified.

## 2019-01-10 NOTE — Progress Notes (Signed)
Sputum sent to lab

## 2019-01-10 NOTE — Progress Notes (Signed)
Patient's BP remains low, no sedatives given. MD made aware.

## 2019-01-10 NOTE — TOC Initial Note (Signed)
Transition of Care [facility]) - Initial/Assessment Note    Patient Details  Name: John Munoz MRN: [id number] Date of Birth: 06-19-1933  [facility] CM/SW Contact:    Shade Flood, LCSW Phone Number: 01/10/2019, 10:15 AM  Clinical Narrative:                  Reviewed pt's record today. CSW consult received for concerns related to pt's home environment and care. RN in ED made APS report per chart notes. Pt currently on the ventilator and Palliative Care consult has been requested by MD.   Damaris Schooner with [facility] at [place] who stated that a full report was not given to them yesterday. Full information provided. APS will review to see if a case worker will be assigned.   Anticipating pt will need rehab/long term care placement at dc unless pt transitions to comfort care status.   Will follow with APS and will assist with any TOC needs.  Expected Discharge Plan: Long Term Nursing Home Barriers to Discharge: Continued Medical Work up   Patient Goals and CMS Choice        Expected Discharge Plan and Services Expected Discharge Plan: [place] In-house Referral: Clinical Social Work     Living arrangements for the past 2 months: Single Family Home                                      Prior Living Arrangements/Services Living arrangements for the past 2 months: Single Family Home Lives with:: Relatives Patient language and need for interpreter reviewed:: Yes Do you feel safe going back to the place where you live?: (APS referral was made in ED due to report of unsanitary/neglectful conditions at home)      Need for Family Participation in Patient Care: Yes (Comment) Care giver support system in place?: Yes (comment) Current home services: Other (comment)(personal care aide) Criminal Activity/Legal Involvement Pertinent to Current Situation/Hospitalization: No - Comment as needed  Activities of Daily Living      Permission Sought/Granted                 Emotional Assessment Appearance:: Appears stated age Attitude/Demeanor/Rapport: Unable to Assess Affect (typically observed): Unable to Assess   Alcohol / Substance Use: Not Applicable Psych Involvement: No (comment)  Admission diagnosis:  Acute pulmonary edema (HCC) [J81.0] Hypoglycemia [E16.2] Pleural effusion, left [J90] Acute respiratory failure with hypoxia (HCC) [J96.01] Pancytopenia (HCC) [D61.818] Hypothermic shock, initial encounter [T68.XXXA] Non-traumatic rhabdomyolysis [M62.82] Altered mental status, unspecified altered mental status type [R41.82] Pneumonia of left lower lobe due to infectious organism ([place]) [J18.1] Sepsis, due to unspecified organism, unspecified whether acute organ dysfunction present [facility]) [A41.9] Patient Active Problem List   Diagnosis Date Noted  . Hypotension 01/10/2019  . Abnormal liver function 01/10/2019  . Pressure injury of skin 01/10/2019  . Sepsis due to undetermined organism ([place]) 01/10/2019  . Acute respiratory failure with hypoxia ([place]) 01/10/2019  . Thrombocytopenia ([place]) 01/10/2019  . Acute CHF (congestive heart failure) ([place]) 01/10/2019  . Acute metabolic encephalopathy [id number]  . Altered mental status   . HCAP (healthcare-associated pneumonia) 01/09/2019  . Sepsis with acute hypoxic respiratory failure ([place]) 01/09/2019   PCP:  Neale Burly, MD Pharmacy:   [place] [facility], [address] [place] [address] Phone: [phone] Fax: [phone]     Social Determinants of  Health (SDOH) Interventions    Readmission Risk Interventions No flowsheet data found.

## 2019-01-10 NOTE — Op Note (Signed)
Patient:  John Munoz  DOB:  05-02-1933  MRN:  [id number]   Preop Diagnosis: Acute respiratory failure with hypoxia  Postop Diagnosis: Same  Procedure: Central line placement  Surgeon: Aviva Signs, MD  Anes: Local  Indications: Patient is an 85 year old white male with multiple medical problems who had acute respiratory failure with hypoxia requiring intubation.  Surgery has been asked to place a triple-lumen catheter for IV access.  The risks and benefits of the procedure were explained to the patient's daughter, who gave informed consent for the patient as the patient is intubated.  Procedure note: The procedure was performed in ICU 3 at bedside.  A timeout was performed.  The central line was placed using the usual sterile technique with mask, gloves, sterile gown, and sterile drape.  1% Xylocaine was used for local anesthesia.  The right femoral vein was accessed without difficulty.  A guidewire was then advanced without difficulty.  An introducer was placed over the guidewire.  A triple-lumen catheter was then inserted over the guidewire and the guidewire was removed.  Good backflow of venous blood was noted on aspiration of all 3 ports.  All 3 ports were flushed with saline.  It was secured in place using a 3-0 silk suture.  A dry sterile dressing was applied.  The patient tolerated the procedure well.  Complications: None  EBL: Minimal  Specimen: None

## 2019-01-10 NOTE — Consult Note (Signed)
Consult requested by: [facility] hospitalist, Dr. Carles Collet Consult requested for: Respiratory failure  HPI: This is an 85 year old who has previous history of COPD depression BPH and who came to the emergency department because of altered mental status.  History is from the medical record because he is intubated and there is no family available.  He at baseline is alert and oriented and ambulates with help.  When he came to the emergency department he was obtunded not responding to voice barely responding to pain and he was intubated.  He had been admitted at [facility] rocking him with what appeared to be cellulitis of the right heel.  This was in April.  He was found to be hypoxic and has been she was intubated in the emergency department.  He had CT of the chest that showed left greater than right infiltrate consistent with pneumonia.  I have personally reviewed that study.  He has remained hypotensive.  He is remained unresponsive.  He remains on the ventilator.  Past Medical History:  Diagnosis Date  . Arthritis   . BPH (benign prostatic hyperplasia)   . Depression   . Gout   . Mental disorder      History reviewed. No pertinent family history.   Social History   Socioeconomic History  . Marital status: Widowed    Spouse name: Not on file  . Number of children: Not on file  . Years of education: Not on file  . Highest education level: Not on file  Occupational History  . Not on file  Social Needs  . Financial resource strain: Not on file  . Food insecurity:    Worry: Not on file    Inability: Not on file  . Transportation needs:    Medical: Not on file    Non-medical: Not on file  Tobacco Use  . Smoking status: Current Every Day Smoker    Packs/day: 1.50    Years: 60.00    Pack years: 90.00    Types: Cigarettes  . Smokeless tobacco: Never Used  Substance and Sexual Activity  . Alcohol use: No  . Drug use: No  . Sexual activity: Yes    Birth control/protection: None  Lifestyle  .  Physical activity:    Days per week: Not on file    Minutes per session: Not on file  . Stress: Not on file  Relationships  . Social connections:    Talks on phone: Not on file    Gets together: Not on file    Attends religious service: Not on file    Active member of club or organization: Not on file    Attends meetings of clubs or organizations: Not on file    Relationship status: Not on file  Other Topics Concern  . Not on file  Social History Narrative  . Not on file     ROS: Obtainable    Objective: Vital signs in last 24 hours: Temp:  [91.1 F (32.8 C)-100.2 F (37.9 C)] 99.2 F (37.3 C) (05/11 0800) Pulse Rate:  [45-106] 97 (05/11 0800) Resp:  [14-29] 17 (05/11 0800) BP: (70-148)/(28-125) 79/45 (05/11 0800) SpO2:  [83 %-100 %] 100 % (05/11 0800) FiO2 (%):  [40 %-60 %] 60 % (05/11 0745) Weight:  [73 kg-75.3 kg] 75.3 kg (05/11 0445) Weight change:     Intake/Output from previous day: 05/10 0701 - 05/11 0700 In: 3108.5 [I.V.:452.4; IV Piggyback:2656.1] Out: 650 [Urine:650]  PHYSICAL EXAM Constitutional: Intubated, on the ventilator.  Eyes: Pupils  react sluggishly.  Ears nose mouth and throat: Somewhat dry mucous membranes.  Cardiovascular: His heart is regular with normal heart sounds.  Respiratory: He has marked bilateral rhonchi and rales.  Gastrointestinal: His abdomen is soft with no masses.  Skin: He has heel protectors and still some erythema.  Musculoskeletal: Cannot assess.  Neurological: Cannot assess psychiatric: Cannot assess  Lab Results: Basic Metabolic Panel: Recent Labs    01/09/19 1113 01/10/19 0413  NA 136 138  K 4.3 3.8  CL 97* 103  CO2 29 26  GLUCOSE 125* 69*  BUN 25* 26*  CREATININE 0.54* 0.88  CALCIUM 8.9 8.4*   Liver Function Tests: Recent Labs    01/09/19 1113 01/10/19 0413  AST 63* 72*  ALT 49* 47*  ALKPHOS 106 95  BILITOT 0.5 0.5  PROT 6.0* 5.4*  ALBUMIN 3.0* 2.7*   No results for input(s): LIPASE, AMYLASE in the  last 72 hours. No results for input(s): AMMONIA in the last 72 hours. CBC: Recent Labs    01/09/19 1113 01/10/19 0413  WBC 3.7* 4.5  NEUTROABS 2.9  --   HGB 8.0* 7.2*  HCT 23.7* 21.2*  MCV 100.0 99.5  PLT 101* 84*   Cardiac Enzymes: Recent Labs    01/09/19 1516 01/10/19 0413 01/10/19 0546  CKTOTAL 551*  --   --   TROPONINI <0.03 0.46* 0.54*   BNP: No results for input(s): PROBNP in the last 72 hours. D-Dimer: No results for input(s): DDIMER in the last 72 hours. CBG: Recent Labs    01/09/19 1056 01/09/19 1134 01/09/19 2356 01/10/19 0436 01/10/19 0743  GLUCAP 55* 101* 73 73 82   Hemoglobin A1C: No results for input(s): HGBA1C in the last 72 hours. Fasting Lipid Panel: Recent Labs    01/09/19 1516  TRIG 47   Thyroid Function Tests: No results for input(s): TSH, T4TOTAL, FREET4, T3FREE, THYROIDAB in the last 72 hours. Anemia Panel: Recent Labs    01/10/19 0546  FERRITIN 266  TIBC 277  IRON 75   Coagulation: No results for input(s): LABPROT, INR in the last 72 hours. Urine Drug Screen: Drugs of Abuse  No results found for: LABOPIA, COCAINSCRNUR, LABBENZ, AMPHETMU, THCU, LABBARB  Alcohol Level: No results for input(s): ETH in the last 72 hours. Urinalysis: Recent Labs    01/09/19 1114  COLORURINE YELLOW  LABSPEC 1.017  PHURINE 5.0  GLUCOSEU NEGATIVE  HGBUR NEGATIVE  BILIRUBINUR NEGATIVE  KETONESUR NEGATIVE  PROTEINUR NEGATIVE  NITRITE NEGATIVE  LEUKOCYTESUR TRACE*   Misc. Labs:   ABGS: Recent Labs    01/10/19 0430  PHART 7.483*  PO2ART 52.9*  HCO3 27.5     MICROBIOLOGY: Recent Results (from the past 240 hour(s))  Blood Culture (routine x 2)     Status: None (Preliminary result)   Collection Time: 01/09/19 11:13 AM  Result Value Ref Range Status   Specimen Description BLOOD LEFT ARM BOTTLES DRAWN AEROBIC AND ANAEROBIC  Final   Special Requests Blood Culture adequate volume  Final   Culture   Final    NO GROWTH < 24  HOURS Performed at [facility], [address], [place], [place] [address]    Report Status PENDING  Incomplete  Blood Culture (routine x 2)     Status: None (Preliminary result)   Collection Time: 01/09/19 11:35 AM  Result Value Ref Range Status   Specimen Description   Final    LEFT ANTECUBITAL BOTTLES DRAWN AEROBIC AND ANAEROBIC   Special Requests Blood Culture adequate volume  Final   Culture   Final    NO GROWTH < 24 HOURS Performed at [facility], [address], [place], [place] [address]    Report Status PENDING  Incomplete  SARS Coronavirus 2 (CEPHEID - Performed in [place] hospital lab), Hosp Order     Status: None   Collection Time: 01/09/19  2:58 PM  Result Value Ref Range Status   SARS Coronavirus 2 NEGATIVE NEGATIVE Final    Comment: (NOTE) If result is NEGATIVE SARS-CoV-2 target nucleic acids are NOT DETECTED. The SARS-CoV-2 RNA is generally detectable in upper and lower  respiratory specimens during the acute phase of infection. The lowest  concentration of SARS-CoV-2 viral copies this assay can detect is 250  copies / mL. A negative result does not preclude SARS-CoV-2 infection  and should not be used as the sole basis for treatment or other  patient management decisions.  A negative result may occur with  improper specimen collection / handling, submission of specimen other  than nasopharyngeal swab, presence of viral mutation(s) within the  areas targeted by this assay, and inadequate number of viral copies  (<250 copies / mL). A negative result must be combined with clinical  observations, patient history, and epidemiological information. If result is POSITIVE SARS-CoV-2 target nucleic acids are DETECTED. The SARS-CoV-2 RNA is generally detectable in upper and lower  respiratory specimens dur ing the acute phase of infection.  Positive  results are indicative of active infection with SARS-CoV-2.  Clinical  correlation with patient history and other  diagnostic information is  necessary to determine patient infection status.  Positive results do  not rule out bacterial infection or co-infection with other viruses. If result is PRESUMPTIVE POSTIVE SARS-CoV-2 nucleic acids MAY BE PRESENT.   A presumptive positive result was obtained on the submitted specimen  and confirmed on repeat testing.  While 2019 novel coronavirus  (SARS-CoV-2) nucleic acids may be present in the submitted sample  additional confirmatory testing may be necessary for epidemiological  and / or clinical management purposes  to differentiate between  SARS-CoV-2 and other Sarbecovirus currently known to infect humans.  If clinically indicated additional testing with an alternate test  methodology [id number]) is advised. The SARS-CoV-2 RNA is generally  detectable in upper and lower respiratory sp ecimens during the acute  phase of infection. The expected result is Negative. Fact Sheet for Patients:  StrictlyIdeas.no Fact Sheet for Healthcare Providers: BankingDealers.co.za This test is not yet approved or cleared by the [place] FDA and has been authorized for detection and/or diagnosis of SARS-CoV-2 by FDA under an Emergency Use Authorization (EUA).  This EUA will remain in effect (meaning this test can be used) for the duration of the COVID-19 declaration under Section 564(b)(1) of the Act, 21 U.S.C. section 360bbb-3(b)(1), unless the authorization is terminated or revoked sooner. Performed at [facility], [address] [place], [place] [address]   MRSA PCR Screening     Status: Abnormal   Collection Time: 01/09/19  8:47 PM  Result Value Ref Range Status   MRSA by PCR POSITIVE (A) NEGATIVE Final    Comment:        The GeneXpert MRSA Assay (FDA approved for NASAL specimens only), is one component of a comprehensive MRSA colonization surveillance program. It is not intended to diagnose MRSA infection nor  to guide or monitor treatment for MRSA infections. RESULT CALLED TO, READ BACK BY AND VERIFIED WITH: TETREAULT,H @ 0007 ON 01/10/19 BY JUW Performed at [facility], [address], [place], [place] [address]     Studies/Results: Ct Head Wo Contrast  Result Date: 01/09/2019 CLINICAL DATA:  85 year old male with altered mental status. EXAM: CT HEAD WITHOUT CONTRAST TECHNIQUE: Contiguous axial images were obtained from the base of the skull through the vertex without intravenous contrast. COMPARISON:  02/28/2017 CT and prior studies FINDINGS: Brain: No evidence of acute infarction, hemorrhage, hydrocephalus, extra-axial collection or mass lesion/mass effect. Atrophy and chronic small-vessel white matter ischemic changes again noted. Vascular: Atherosclerotic calcifications again noted Skull: Normal. Negative for fracture or focal lesion. Sinuses/Orbits: No acute finding. Other: None. IMPRESSION: 1. No evidence of acute intracranial abnormality. 2. Atrophy and chronic small-vessel white matter ischemic changes. Electronically Signed   By: Margarette Canada M.D.   On: 01/09/2019 15:20   Ct Chest W Contrast  Result Date: 01/09/2019 CLINICAL DATA:  Cough and pleural effusion EXAM: CT CHEST WITH CONTRAST TECHNIQUE: Multidetector CT imaging of the chest was performed during intravenous contrast administration. CONTRAST:  49m OMNIPAQUE 300 COMPARISON:  Plain film from earlier in the same day. FINDINGS: Cardiovascular: Thoracic aorta and its branches demonstrate atherosclerotic calcifications without aneurysmal dilatation or dissection. Diffuse coronary calcifications are seen. The heart is at the upper limits of normal in size. Pulmonary artery is well visualized. No large central pulmonary embolus is seen. Timing was not performed for embolus evaluation. Mediastinum/Nodes: The esophagus as visualized is within normal limits. Thoracic inlet is within normal limits. Scattered small mediastinal lymph nodes are seen  likely reactive in nature. Lungs/Pleura: Mild right lower lobe atelectatic changes are noted with small right pleural effusion. A larger left-sided pleural effusion is noted with associated lower lobe infiltrate with air bronchograms consistent with pneumonia. These changes are similar to that seen on recent plain film. No parenchymal nodules are seen. Upper Abdomen: Prior cholecystectomy is noted. No acute abnormality is noted within the abdomen. Musculoskeletal: Degenerative changes of the thoracic spine are noted. IMPRESSION: Bilateral pleural effusions left greater than right. Left lower lobe infiltrate consistent with acute pneumonia. Mild right basilar atelectasis is seen. Aortic Atherosclerosis (ICD10-I70.0). Electronically Signed   By: MInez CatalinaM.D.   On: 01/09/2019 15:17   Dg Chest Port 1 View  Result Date: 01/10/2019 CLINICAL DATA:  85year old male with enteric tube placement. EXAM: PORTABLE CHEST 1 VIEW COMPARISON:  Chest radiograph dated 01/09/2019 FINDINGS: Evaluation is limited due to patient's positioning and portable technique. An enteric tube is noted with side-port just distal to the region of the gastroesophageal junction and tip in the proximal stomach. An endotracheal tube appears above the carina. No significant change in the appearance of the lungs or heart since the earlier radiograph. IMPRESSION: Enteric tube with side-port just distal to the GE junction and tip in the proximal stomach. The tube can be further advanced by an additional 5-7 cm for optimal positioning. Electronically Signed   By: AAnner CreteM.D.   On: 01/10/2019 02:49   Dg Chest Port 1 View  Result Date: 01/09/2019 CLINICAL DATA:  85year old male status post enteric tube placement. EXAM: PORTABLE CHEST 1 VIEW COMPARISON:  Earlier radiograph dated 01/09/2019 FINDINGS: Endotracheal tube with tip above the carina in similar position. An enteric tube extends into the upper abdomen with tip likely in the  proximal stomach. The side port of the enteric tube is in the region of the GE junction. Recommend further advancing of the tube by approximately 5 cm. Left-sided pleural effusion and associated atelectasis or infiltrate similar or slightly progressed since  the prior radiograph. No other interval change. IMPRESSION: Enteric tube with side-port in the region of the GE junction and tip in the proximal stomach. Recommend further advancing the tube by approximately 5 cm. Electronically Signed   By: Anner Crete M.D.   On: 01/09/2019 23:47   Dg Chest Portable 1 View  Result Date: 01/09/2019 CLINICAL DATA:  85 year old male intubated. Left lower lobe consolidation, bilateral pleural effusions. EXAM: PORTABLE CHEST 1 VIEW COMPARISON:  Chest CT and radiographs earlier today FINDINGS: Portable AP supine view at 1759 hours. Endotracheal tube tip in good position between the level the clavicles and carina. Enteric tube courses to the left abdomen, tip not included. The side hole might be visible at the level of the distal esophagus (arrow). Dense left lung base opacification. Stable cardiac size and mediastinal contours. No pneumothorax. Stable pulmonary vascularity. No confluent right lung opacity. IMPRESSION: 1. ET tube in good position. 2. Enteric tube courses to the left abdomen but the side hole might still be in the distal esophagus. Recommend advancing 5-6 more centimeters. 3. Left lower consolidation and effusion as seen by CT earlier today. Electronically Signed   By: Genevie Ann M.D.   On: 01/09/2019 18:22   Dg Chest Port 1 View  Result Date: 01/09/2019 CLINICAL DATA:  Altered mental status. EXAM: PORTABLE CHEST 1 VIEW COMPARISON:  Radiographs of December 01, 2018. FINDINGS: Stable cardiomegaly. Atherosclerosis of thoracic aorta is noted. No pneumothorax is noted. Bilateral interstitial densities are noted concerning for pulmonary edema. Mild left pleural effusion is noted with probable associated atelectasis or  infiltrate. Bony thorax is unremarkable. IMPRESSION: Stable cardiomegaly with bilateral interstitial densities concerning for edema. Mild left pleural effusion is noted with associated atelectasis or infiltrate. Aortic Atherosclerosis (ICD10-I70.0). Electronically Signed   By: Marijo Conception M.D.   On: 01/09/2019 11:50    Medications:  Prior to Admission:  Medications Prior to Admission  Medication Sig Dispense Refill Last Dose  . acetaminophen (TYLENOL) 500 MG tablet Take 1,000 mg by mouth every 6 (six) hours as needed. Pain   Past Week at Unknown  . allopurinol (ZYLOPRIM) 300 MG tablet Take 300 mg by mouth daily.   01/08/2019 at Unknown time  . aspirin 81 MG chewable tablet Chew 81 mg by mouth daily.   01/08/2019 at 0930  . busPIRone (BUSPAR) 15 MG tablet Take 15 mg by mouth 2 (two) times daily.   01/08/2019 at Unknown time  . calcium gluconate 500 MG tablet Take 1 tablet by mouth daily.   01/08/2019 at Unknown time  . citalopram (CELEXA) 20 MG tablet Take 20 mg by mouth daily.   01/08/2019 at Unknown time  . folic acid (FOLVITE) 1 MG tablet Take 1 mg by mouth daily.   01/08/2019 at Unknown time  . gabapentin (NEURONTIN) 300 MG capsule Take 300 mg by mouth 3 (three) times daily.   01/08/2019 at Unknown time  . Melatonin 5 MG TABS Take 1 tablet by mouth at bedtime.   01/08/2019 at Unknown time  . midodrine (PROAMATINE) 2.5 MG tablet Take 2.5 mg by mouth 2 (two) times a day.   01/08/2019 at Unknown time  . Omega-3 Fatty Acids (FISH OIL) 1000 MG CAPS Take 1 capsule by mouth 2 (two) times a day.   01/08/2019 at Unknown time  . omeprazole (PRILOSEC) 20 MG capsule Take 20 mg by mouth daily.   01/08/2019 at Unknown time  . Tamsulosin HCl (FLOMAX) 0.4 MG CAPS Take 0.4 mg by mouth daily.  01/08/2019 at Unknown time  . traMADol (ULTRAM) 50 MG tablet Take 50 mg by mouth 3 (three) times daily.   01/08/2019 at Unknown time  . vitamin B-12 (CYANOCOBALAMIN) 1000 MCG tablet Take 1,000 mcg by mouth daily.   01/08/2019 at Unknown time   . ciprofloxacin (CIPRO) 500 MG tablet Take 500 mg by mouth 2 (two) times daily.   Completed Course at Unknown time   Scheduled: . sodium chloride   Intravenous Once  . albuterol  2.5 mg Nebulization Q4H  . chlorhexidine gluconate (MEDLINE KIT)  15 mL Mouth Rinse BID  . Chlorhexidine Gluconate Cloth  6 each Topical Q0600  . diphenhydrAMINE  25 mg Intravenous Once  . enoxaparin (LOVENOX) injection  40 mg Subcutaneous Q24H  . mouth rinse  15 mL Mouth Rinse 10 times per day  . mupirocin ointment  1 application Nasal BID   Continuous: . ceFEPime (MAXIPIME) IV 2 g (01/10/19 0428)  . dextrose 5 % and 0.9% NaCl 50 mL/hr at 01/10/19 0428  . famotidine (PEPCID) IV Stopped (01/09/19 2225)  . vancomycin     BUY:[id number], fentaNYL (SUBLIMAZE) injection, fentaNYL (SUBLIMAZE) injection, ipratropium-albuterol, midazolam, midazolam, ondansetron (ZOFRAN) IV  Assesment: He has healthcare associated pneumonia and sepsis.  He is still hypotensive and he may require pressors.  He has abnormal LFTs so some element perhaps of hypoperfusion of the liver.  He has altered mental status likely from his primary problem  He has elevated troponin but EKG does not show definite changes of acute coronary syndrome  He is negative for COVID-19  He has COPD at baseline and he is on nebulizer treatments  He is critically ill with multisystem failure  He is anemic with hemoglobin of 7.2 and has low platelet count at 84,000 Active Problems:   HCAP (healthcare-associated pneumonia)   Sepsis with acute hypoxic respiratory failure (HCC)   Hypotension   Abnormal liver function    Plan: Continue treatments.  If his blood pressure does not come up I think he is going to require pressors.  Palliative care consultation has been requested    LOS: 1 day   Alonza Bogus 01/10/2019, 8:19 AM

## 2019-01-10 NOTE — Progress Notes (Signed)
Increased oxygen to 60 as patients pulse ox probe on forehead most likely wrong. Probe changed to rt thumb saturation 98 on 60%

## 2019-01-10 NOTE — Progress Notes (Signed)
John Fantasia, NP paged to get wrist restraints reordered d/t pt pulling at vent and tubes. Mitts were unsuccessful. Waiting for call back/orders. Will continue to monitor pt

## 2019-01-10 NOTE — Consult Note (Signed)
Consultation Note Date: 01/10/2019   Patient Name: John Munoz  DOB: 1932-12-10  MRN: [id number]  Age / Sex: 85 y.o., male  PCP: Neale Burly, MD Referring Physician: Orson Eva, MD  Reason for Consultation: Establishing goals of care  HPI/Patient Profile: 85 y.o. male  with past medical history of COPD, BPH, depression, anxiety, recent admission 4/1-12/06/18 for right heel pressure ulcer with necrotic tissue and cellulitis, recenlyt treated for UTI admitted on 01/09/2019 with AMS requiring intubation d/t respiratory failure with sepsis, pneumonia, fluid overload (r/o CHF). He remains on ventilator and is not responding or following any commands. He continues to be critically ill.   Clinical Assessment and Goals of Care: I spent some time today reviewing records, assessing, and speaking with [name] medical team including RN and attending MD. [name] is restless but does not open his eyes or follow any commands. He has no response except agitation.   I called and spoke with both his niece, John Munoz, and his great-niece, John Munoz (John Munoz's daughter). He has been living with John Munoz for the past 4 years where she cares for him 24/7. They report that he lived with his mother who helped him prior to John Munoz. He has required care assistance for a long time. However they tell me that he ate well and was alert and oriented prior to admission. He was weak but could walk with a walker with some assistance but was mostly chair-bound. They say he had a spouse but they separated and she has since died. He has no children. He has one sibling left who is John Munoz's mother. He has no documented HCPOA but family looks to John Munoz for decisions (as did Mr. Tsang they report).   We discussed the severity of his health issues during this hospitalization. They are not aware of any documented or clearly expressed wishes  from [name]. They believe that he would want to try continued ventilator support but not long term if he is not improving. They agree that he should not be resuscitated if he were to further decline but desire continued aggressive care at this time. They are hopeful for improvement but also supporting each other to prepare that he may not recover. John Munoz is speaking with her mother (patient's sister) when I called her back. Family appears to be communicating well and supporting each other during this time.   All questions/concerns addressed. Emotional support provided. I will continue to follow and support. I have noted APS report per CSW notes.   Primary Decision Maker NEXT OF KIN niece John Munoz and great-niece John Munoz (primary caregiver) along with his sister (elderly and unable to participate in conversation per family)    SUMMARY OF RECOMMENDATIONS   - DNR decided - Continue aggressive care otherwise - Family trying to prepare themselves for possible poor outcome  Code Status/Advance Care Planning:  DNR   Symptom Management:   Per attending.   Palliative Prophylaxis:   Aspiration, Bowel Regimen, Delirium Protocol, Oral Care and Turn Reposition  Additional Recommendations (Limitations, Scope, Preferences):  Full Scope Treatment  Psycho-social/Spiritual:   Desire for further Chaplaincy support:no  Additional Recommendations: Caregiving  Support/Resources and Grief/Bereavement Support  Prognosis:   Overall prognosis is poor.   Discharge Planning: To Be Determined      Primary Diagnoses: Present on Admission:  HCAP (healthcare-associated pneumonia)  Hypotension  Abnormal liver function   I have reviewed the medical record, interviewed the patient and family, and examined the patient. The following aspects are pertinent.  Past Medical History:  Diagnosis Date   Arthritis    BPH (benign prostatic hyperplasia)    Depression    Gout    Mental  disorder    Social History   Socioeconomic History   Marital status: Widowed    Spouse name: Not on file   Number of children: Not on file   Years of education: Not on file   Highest education level: Not on file  Occupational History   Not on file  Social Needs   Financial resource strain: Not on file   Food insecurity:    Worry: Not on file    Inability: Not on file   Transportation needs:    Medical: Not on file    Non-medical: Not on file  Tobacco Use   Smoking status: Current Every Day Smoker    Packs/day: 1.50    Years: 60.00    Pack years: 90.00    Types: Cigarettes   Smokeless tobacco: Never Used  Substance and Sexual Activity   Alcohol use: No   Drug use: No   Sexual activity: Yes    Birth control/protection: None  Lifestyle   Physical activity:    Days per week: Not on file    Minutes per session: Not on file   Stress: Not on file  Relationships   Social connections:    Talks on phone: Not on file    Gets together: Not on file    Attends religious service: Not on file    Active member of club or organization: Not on file    Attends meetings of clubs or organizations: Not on file    Relationship status: Not on file  Other Topics Concern   Not on file  Social History Narrative   Not on file   History reviewed. No pertinent family history. Scheduled Meds:  sodium chloride   Intravenous Once   albuterol  2.5 mg Nebulization Q4H   chlorhexidine gluconate (MEDLINE KIT)  15 mL Mouth Rinse BID   Chlorhexidine Gluconate Cloth  6 each Topical Q0600   Chlorhexidine Gluconate Cloth  6 each Topical Daily   diphenhydrAMINE  25 mg Intravenous Once   enoxaparin (LOVENOX) injection  40 mg Subcutaneous Q24H   mouth rinse  15 mL Mouth Rinse 10 times per day   mupirocin ointment  1 application Nasal BID   sodium chloride flush  10-40 mL Intracatheter Q12H   Continuous Infusions:  ceFEPime (MAXIPIME) IV Stopped (01/10/19 0459)    dextrose 5 % and 0.9% NaCl 50 mL/hr at 01/10/19 1000   famotidine (PEPCID) IV Stopped (01/10/19 0950)   norepinephrine (LEVOPHED) Adult infusion 2 mcg/min (01/10/19 1000)   vancomycin     PRN Meds:.acetaminophen, fentaNYL (SUBLIMAZE) injection, fentaNYL (SUBLIMAZE) injection, ipratropium-albuterol, midazolam, midazolam, ondansetron (ZOFRAN) IV, sodium chloride flush No Known Allergies Review of Systems  Unable to perform ROS: Acuity of condition    Physical Exam Vitals signs and nursing note reviewed.  Constitutional:      Appearance: He  is ill-appearing.     Interventions: He is intubated.  Cardiovascular:     Rate and Rhythm: Tachycardia present.  Pulmonary:     Effort: No accessory muscle usage or respiratory distress. He is intubated.     Breath sounds: Rales present.  Abdominal:     Palpations: Abdomen is soft.  Neurological:     Mental Status: He is unresponsive.     Vital Signs: BP (!) 105/59    Pulse (!) 118    Temp 98.9 F (37.2 C)    Resp (!) 22    Ht '5\' 8"'  (1.727 m)    Wt 75.3 kg    SpO2 99%    BMI 25.24 kg/m  Pain Scale: CPOT   Pain Score: Asleep   SpO2: SpO2: 99 % O2 Device:SpO2: 99 % O2 Flow Rate: .   IO: Intake/output summary:   Intake/Output Summary (Last 24 hours) at 01/10/2019 1106 Last data filed at 01/10/2019 1000 Gross per 24 hour  Intake 3469.58 ml  Output 770 ml  Net 2699.58 ml    LBM:   Baseline Weight: Weight: 73 kg Most recent weight: Weight: 75.3 kg     Palliative Assessment/Data:     Time In/Out: 1100-1130, 1335-1405 Time Total: 50 min Greater than 50%  of this time was spent counseling and coordinating care related to the above assessment and plan.  Signed by: Vinie Sill, NP Palliative Medicine Team Pager # [phone] (M-F 8a-5p) Team Phone # [phone] (Nights/Weekends)

## 2019-01-10 NOTE — Progress Notes (Addendum)
[facility] RN states Trop 0.46 Hr 96-103 Pt is intubated, unable to provide information.   A/P Elevated troponin Tele 12 lead ekg Trop I  Check lipid Aspirin per rectum No statin due to abnormal liver function Cardiology consult ordered  Anemia/ Pancytopenia Check ferritin, iron, tibc, b12, folate, esr, spep Type and screen Transfuse 2 units prbc for symptomatic anemia

## 2019-01-10 NOTE — Progress Notes (Signed)
MD Tat notified of pt's BP 70s/40s after completion of bolus. Blood products are not ready at this time. Order given for PICC line, called PICC line RN and was told pt's BP is too unstable for PICC placement. MD Tat notified.

## 2019-01-10 NOTE — Consult Note (Signed)
[facility] Nurse wound consult note Performed with review of chart and images. Patient has been too unstable for me to camera in to the room today. Reason for Consult: heel wound, patient has had surgical debridement in the past. Unclear where this took place  Wound type: pressure injury right heel; Stage 3, full thickness Pressure Injury POA: Yes Measurement: 5cm x 5cm x 0.2cm  Wound bed: 90% clean/10% slough/fibrin Drainage (amount, consistency, odor) scant, per nursing flow sheet Periwound: intact  Dressing procedure/placement/frequency: Enzymatic debridement and saline moist gauze. Cover with dry dressing. Change daily. Prevalon boots to offoad heels.    Re consult if needed, will not follow at this time. Thanks  Karim Aiello [employer], RN,CWOCN, CNS, [place] [phone])

## 2019-01-10 NOTE — Progress Notes (Signed)
PROGRESS NOTE  John Munoz YKD:[id number] DOB: 09/27/1932 DOA: 01/09/2019 PCP: Neale Burly, MD  Brief History:  85 year old male with a history of COPD, BPH, GERD, and anxiety presenting with altered mental status.  The patient is currently on a ventilator and is unable to provide any history.  Attempt to contact family was unsuccessful.  Apparently, the patient was noted to have somnolence and altered mental status by home health aide 2 days prior to this admission.  At baseline, the patient is alert and oriented and usually ambulates with a walker.  Apparently, the patient had a recent admission to [facility] from 12/01/2018 through 12/06/2018 where he was treated for right heel ulcer and infection.  In addition, the patient also recently finished antibiotics for presumptive UTI.  He was subsequently discharged home.  In the emergency department, the patient was noted to be somnolent minimally responsive to pain.  He was initially placed on BiPAP, but was subsequently intubated secondary to his inability to protect his airway.  The patient was initially hypothermic with a temperature 91.1 F and hypotensive.  Assessment/Plan: Sepsis -Present at time of admission -Secondary to HCAP -Check procalcitonin -Continue vancomycin and cefepime pending culture data -Lactic acid peaked 0.9 -GI prophylaxis -Start Levophed  Acute respiratory failure with hypoxia -Secondary to pneumonia and fluid overload -Pulmonary consult -Wean to extubation as tolerated -personally reviewed cXR--increased interstital markings, LL opacity  Lobar pneumonia/HCAP -01/09/2019 CT chest--LLL infiltrate with air bronchograms and left-sided effusion -Continue vancomycin and cefepime -MRSA screen positive -COVID-19--NEG  Elevated troponin -likely due to demand ischemia in the setting of sepsis -echo -personally reviewed EKG--sinus, nonspecific T wave changes  Lower extremity edema -Venous duplex rule out  DVT  Acute CHF, type unspecified -Work-up in progress -Echocardiogram -Judicious diuresis as the patient was hypotensive  Transaminasemia -Secondary to sepsis -Trend  Right heel decubitus ulcer--unstageable -Present at the time of admission -Does not appear to be clinically infected on examination -See pictures below -Wound care consult  Thrombocytopenia -due to sepsis -L97 -folic acid -check coags -fibrinogen  Anxiety/depression  -restart BuSpar and Celexa when able to tolerate oral intake      Disposition Plan:   Remain in ICU Family Communication:  No Family at bedside  Consultants:  pulm  Code Status:  FULL   DVT Prophylaxis:  SCDs   Procedures: As Listed in Progress Note Above  Antibiotics: vanco 5/11>>> Cefepime 5/11>>>   The patient is critically ill with multiple organ systems failure and requires high complexity decision making for assessment and support, frequent evaluation and titration of therapies, application of advanced monitoring technologies and extensive interpretation of multiple databases.  Critical care time - 35 mins. In addition to time spent by Dr. Maudie Mercury    Subjective: Pt on ventilator.  ROS unobtainable.  No reports of vomiting, diarrhea, uncontrolled pain, respiratory distress  Objective: Vitals:   01/10/19 0600 01/10/19 0700 01/10/19 0745 01/10/19 0800  BP: (!) 82/35 (!) 83/45  (!) 79/45  Pulse: 99 97  97  Resp: _0 Temp: 99 F (37.2 C) 99.1 F (37.3 C)  99.2 F (37.3 C)  TempSrc:  Core    SpO2: 98% 99% 100% 100%  Weight:      Height:        Intake/Output Summary (Last 24 hours) at 01/10/2019 0830 Last data filed at 01/10/2019 0735 Gross per 24 hour  Intake 3108.45 ml  Output 770 ml  Net 2338.45 ml   Weight change:  Exam:   General:  Pt is alert, follows commands appropriately, not in acute distress  HEENT: No icterus, No thrush, No neck mass, [place]/AT  Cardiovascular: RRR, S1/S2, no rubs, no  gallops  Respiratory: bilateral crackles, bilateral rales.  Abdomen: Soft/+BS, non tender, non distended, no guarding  Extremities: 2 + LE edema, No lymphangitis, No petechiae, No rashes, no synovitis   Data Reviewed: I have personally reviewed following labs and imaging studies Basic Metabolic Panel: Recent Labs  Lab 01/09/19 1113 01/10/19 0413  NA 136 138  K 4.3 3.8  CL 97* 103  CO2 29 26  GLUCOSE 125* 69*  BUN 25* 26*  CREATININE 0.54* 0.88  CALCIUM 8.9 8.4*   Liver Function Tests: Recent Labs  Lab 01/09/19 1113 01/10/19 0413  AST 63* 72*  ALT 49* 47*  ALKPHOS 106 95  BILITOT 0.5 0.5  PROT 6.0* 5.4*  ALBUMIN 3.0* 2.7*   No results for input(s): LIPASE, AMYLASE in the last 168 hours. No results for input(s): AMMONIA in the last 168 hours. Coagulation Profile: No results for input(s): INR, PROTIME in the last 168 hours. CBC: Recent Labs  Lab 01/09/19 1113 01/10/19 0413  WBC 3.7* 4.5  NEUTROABS 2.9  --   HGB 8.0* 7.2*  HCT 23.7* 21.2*  MCV 100.0 99.5  PLT 101* 84*   Cardiac Enzymes: Recent Labs  Lab 01/09/19 1516 01/10/19 0413 01/10/19 0546  CKTOTAL 551*  --   --   TROPONINI <0.03 0.46* 0.54*   BNP: Invalid input(s): POCBNP CBG: Recent Labs  Lab 01/09/19 1056 01/09/19 1134 01/09/19 2356 01/10/19 0436 01/10/19 0743  GLUCAP 55* 101* 73 73 82   HbA1C: No results for input(s): HGBA1C in the last 72 hours. Urine analysis:    Component Value Date/Time   COLORURINE YELLOW 01/09/2019 [place] 01/09/2019 1114   LABSPEC 1.017 01/09/2019 1114   PHURINE 5.0 01/09/2019 1114   GLUCOSEU NEGATIVE 01/09/2019 1114   [place] 01/09/2019 1114   [place] 01/09/2019 1114   [place] 01/09/2019 [place] 01/09/2019 1114   NITRITE NEGATIVE 01/09/2019 [place] (A) 01/09/2019 1114   Sepsis Labs: _0 (procalcitonin:4,lacticidven:4) ) Recent Results (from the past 240  hour(s))  Blood Culture (routine x 2)     Status: None (Preliminary result)   Collection Time: 01/09/19 11:13 AM  Result Value Ref Range Status   Specimen Description BLOOD LEFT ARM BOTTLES DRAWN AEROBIC AND ANAEROBIC  Final   Special Requests Blood Culture adequate volume  Final   Culture   Final    NO GROWTH < 24 HOURS Performed at [facility], [address] [place], [place] [address]    Report Status PENDING  Incomplete  Blood Culture (routine x 2)     Status: None (Preliminary result)   Collection Time: 01/09/19 11:35 AM  Result Value Ref Range Status   Specimen Description   Final    LEFT ANTECUBITAL BOTTLES DRAWN AEROBIC AND ANAEROBIC   Special Requests Blood Culture adequate volume  Final   Culture   Final    NO GROWTH < 24 HOURS Performed at [facility], [address], [place], [place] [address]    Report Status PENDING  Incomplete  SARS Coronavirus 2 (CEPHEID - Performed in [place] hospital lab), Hosp Order     Status: None   Collection Time: 01/09/19  2:58 PM  Result Value Ref Range Status  SARS Coronavirus 2 NEGATIVE NEGATIVE Final    Comment: (NOTE) If result is NEGATIVE SARS-CoV-2 target nucleic acids are NOT DETECTED. The SARS-CoV-2 RNA is generally detectable in upper and lower  respiratory specimens during the acute phase of infection. The lowest  concentration of SARS-CoV-2 viral copies this assay can detect is 250  copies / mL. A negative result does not preclude SARS-CoV-2 infection  and should not be used as the sole basis for treatment or other  patient management decisions.  A negative result may occur with  improper specimen collection / handling, submission of specimen other  than nasopharyngeal swab, presence of viral mutation(s) within the  areas targeted by this assay, and inadequate number of viral copies  (<250 copies / mL). A negative result must be combined with clinical  observations, patient history, and epidemiological information. If  result is POSITIVE SARS-CoV-2 target nucleic acids are DETECTED. The SARS-CoV-2 RNA is generally detectable in upper and lower  respiratory specimens dur ing the acute phase of infection.  Positive  results are indicative of active infection with SARS-CoV-2.  Clinical  correlation with patient history and other diagnostic information is  necessary to determine patient infection status.  Positive results do  not rule out bacterial infection or co-infection with other viruses. If result is PRESUMPTIVE POSTIVE SARS-CoV-2 nucleic acids MAY BE PRESENT.   A presumptive positive result was obtained on the submitted specimen  and confirmed on repeat testing.  While 2019 novel coronavirus  (SARS-CoV-2) nucleic acids may be present in the submitted sample  additional confirmatory testing may be necessary for epidemiological  and / or clinical management purposes  to differentiate between  SARS-CoV-2 and other Sarbecovirus currently known to infect humans.  If clinically indicated additional testing with an alternate test  methodology [phone]) is advised. The SARS-CoV-2 RNA is generally  detectable in upper and lower respiratory sp ecimens during the acute  phase of infection. The expected result is Negative. Fact Sheet for Patients:  StrictlyIdeas.no Fact Sheet for Healthcare Providers: BankingDealers.co.za This test is not yet approved or cleared by the [place] FDA and has been authorized for detection and/or diagnosis of SARS-CoV-2 by FDA under an Emergency Use Authorization (EUA).  This EUA will remain in effect (meaning this test can be used) for the duration of the COVID-19 declaration under Section 564(b)(1) of the Act, 21 U.S.C. section 360bbb-3(b)(1), unless the authorization is terminated or revoked sooner. Performed at [facility], [address], [place], [place] [address]   MRSA PCR Screening     Status: Abnormal   Collection  Time: 01/09/19  8:47 PM  Result Value Ref Range Status   MRSA by PCR POSITIVE (A) NEGATIVE Final    Comment:        The GeneXpert MRSA Assay (FDA approved for NASAL specimens only), is one component of a comprehensive MRSA colonization surveillance program. It is not intended to diagnose MRSA infection nor to guide or monitor treatment for MRSA infections. RESULT CALLED TO, READ BACK BY AND VERIFIED WITH: TETREAULT,H @ 0007 ON 01/10/19 BY JUW Performed at [facility], [address], [place], [place] [address]      Scheduled Meds:  sodium chloride   Intravenous Once   albuterol  2.5 mg Nebulization Q4H   chlorhexidine gluconate (MEDLINE KIT)  15 mL Mouth Rinse BID   Chlorhexidine Gluconate Cloth  6 each Topical Q0600   diphenhydrAMINE  25 mg Intravenous Once   enoxaparin (LOVENOX) injection  40 mg Subcutaneous Q24H  mouth rinse  15 mL Mouth Rinse 10 times per day   mupirocin ointment  1 application Nasal BID   Continuous Infusions:  ceFEPime (MAXIPIME) IV 2 g (01/10/19 0428)   dextrose 5 % and 0.9% NaCl 50 mL/hr at 01/10/19 0428   famotidine (PEPCID) IV Stopped (01/09/19 2225)   vancomycin      Procedures/Studies: Ct Head Wo Contrast  Result Date: 01/09/2019 CLINICAL DATA:  85 year old male with altered mental status. EXAM: CT HEAD WITHOUT CONTRAST TECHNIQUE: Contiguous axial images were obtained from the base of the skull through the vertex without intravenous contrast. COMPARISON:  02/28/2017 CT and prior studies FINDINGS: Brain: No evidence of acute infarction, hemorrhage, hydrocephalus, extra-axial collection or mass lesion/mass effect. Atrophy and chronic small-vessel white matter ischemic changes again noted. Vascular: Atherosclerotic calcifications again noted Skull: Normal. Negative for fracture or focal lesion. Sinuses/Orbits: No acute finding. Other: None. IMPRESSION: 1. No evidence of acute intracranial abnormality. 2. Atrophy and chronic small-vessel  white matter ischemic changes. Electronically Signed   By: Margarette Canada M.D.   On: 01/09/2019 15:20   Ct Chest W Contrast  Result Date: 01/09/2019 CLINICAL DATA:  Cough and pleural effusion EXAM: CT CHEST WITH CONTRAST TECHNIQUE: Multidetector CT imaging of the chest was performed during intravenous contrast administration. CONTRAST:  105m OMNIPAQUE 300 COMPARISON:  Plain film from earlier in the same day. FINDINGS: Cardiovascular: Thoracic aorta and its branches demonstrate atherosclerotic calcifications without aneurysmal dilatation or dissection. Diffuse coronary calcifications are seen. The heart is at the upper limits of normal in size. Pulmonary artery is well visualized. No large central pulmonary embolus is seen. Timing was not performed for embolus evaluation. Mediastinum/Nodes: The esophagus as visualized is within normal limits. Thoracic inlet is within normal limits. Scattered small mediastinal lymph nodes are seen likely reactive in nature. Lungs/Pleura: Mild right lower lobe atelectatic changes are noted with small right pleural effusion. A larger left-sided pleural effusion is noted with associated lower lobe infiltrate with air bronchograms consistent with pneumonia. These changes are similar to that seen on recent plain film. No parenchymal nodules are seen. Upper Abdomen: Prior cholecystectomy is noted. No acute abnormality is noted within the abdomen. Musculoskeletal: Degenerative changes of the thoracic spine are noted. IMPRESSION: Bilateral pleural effusions left greater than right. Left lower lobe infiltrate consistent with acute pneumonia. Mild right basilar atelectasis is seen. Aortic Atherosclerosis (ICD10-I70.0). Electronically Signed   By: MInez CatalinaM.D.   On: 01/09/2019 15:17   Dg Chest Port 1 View  Result Date: 01/10/2019 CLINICAL DATA:  85year old male with enteric tube placement. EXAM: PORTABLE CHEST 1 VIEW COMPARISON:  Chest radiograph dated 01/09/2019 FINDINGS: Evaluation  is limited due to patient's positioning and portable technique. An enteric tube is noted with side-port just distal to the region of the gastroesophageal junction and tip in the proximal stomach. An endotracheal tube appears above the carina. No significant change in the appearance of the lungs or heart since the earlier radiograph. IMPRESSION: Enteric tube with side-port just distal to the GE junction and tip in the proximal stomach. The tube can be further advanced by an additional 5-7 cm for optimal positioning. Electronically Signed   By: AAnner CreteM.D.   On: 01/10/2019 02:49   Dg Chest Port 1 View  Result Date: 01/09/2019 CLINICAL DATA:  85year old male status post enteric tube placement. EXAM: PORTABLE CHEST 1 VIEW COMPARISON:  Earlier radiograph dated 01/09/2019 FINDINGS: Endotracheal tube with tip above the carina in similar position. An enteric tube extends  into the upper abdomen with tip likely in the proximal stomach. The side port of the enteric tube is in the region of the GE junction. Recommend further advancing of the tube by approximately 5 cm. Left-sided pleural effusion and associated atelectasis or infiltrate similar or slightly progressed since the prior radiograph. No other interval change. IMPRESSION: Enteric tube with side-port in the region of the GE junction and tip in the proximal stomach. Recommend further advancing the tube by approximately 5 cm. Electronically Signed   By: Anner Crete M.D.   On: 01/09/2019 23:47   Dg Chest Portable 1 View  Result Date: 01/09/2019 CLINICAL DATA:  85 year old male intubated. Left lower lobe consolidation, bilateral pleural effusions. EXAM: PORTABLE CHEST 1 VIEW COMPARISON:  Chest CT and radiographs earlier today FINDINGS: Portable AP supine view at 1759 hours. Endotracheal tube tip in good position between the level the clavicles and carina. Enteric tube courses to the left abdomen, tip not included. The side hole might be visible at  the level of the distal esophagus (arrow). Dense left lung base opacification. Stable cardiac size and mediastinal contours. No pneumothorax. Stable pulmonary vascularity. No confluent right lung opacity. IMPRESSION: 1. ET tube in good position. 2. Enteric tube courses to the left abdomen but the side hole might still be in the distal esophagus. Recommend advancing 5-6 more centimeters. 3. Left lower consolidation and effusion as seen by CT earlier today. Electronically Signed   By: Genevie Ann M.D.   On: 01/09/2019 18:22   Dg Chest Port 1 View  Result Date: 01/09/2019 CLINICAL DATA:  Altered mental status. EXAM: PORTABLE CHEST 1 VIEW COMPARISON:  Radiographs of December 01, 2018. FINDINGS: Stable cardiomegaly. Atherosclerosis of thoracic aorta is noted. No pneumothorax is noted. Bilateral interstitial densities are noted concerning for pulmonary edema. Mild left pleural effusion is noted with probable associated atelectasis or infiltrate. Bony thorax is unremarkable. IMPRESSION: Stable cardiomegaly with bilateral interstitial densities concerning for edema. Mild left pleural effusion is noted with associated atelectasis or infiltrate. Aortic Atherosclerosis (ICD10-I70.0). Electronically Signed   By: Marijo Conception M.D.   On: 01/09/2019 11:50    Orson Eva, DO  [facility] Pager [phone]  If 7PM-7AM, please contact night-coverage [email or web address] Password [email or web address] 01/10/2019, 8:30 AM   LOS: 1 day

## 2019-01-10 NOTE — Progress Notes (Signed)
CRITICAL VALUE ALERT  Critical Value:  Troponin 0.46  Date & Time Notied:  01/10/19, 0520  Provider Notified: Maudie Mercury  Orders Received/Actions taken:

## 2019-01-10 NOTE — Procedures (Signed)
Heart rate too high for accurate echo at this time. 

## 2019-01-11 ENCOUNTER — Inpatient Hospital Stay (HOSPITAL_COMMUNITY): Payer: Medicare Other

## 2019-01-11 DIAGNOSIS — G9341 Metabolic encephalopathy: Secondary | ICD-10-CM

## 2019-01-11 DIAGNOSIS — D61818 Other pancytopenia: Secondary | ICD-10-CM

## 2019-01-11 DIAGNOSIS — J181 Lobar pneumonia, unspecified organism: Secondary | ICD-10-CM

## 2019-01-11 DIAGNOSIS — J189 Pneumonia, unspecified organism: Secondary | ICD-10-CM

## 2019-01-11 DIAGNOSIS — I361 Nonrheumatic tricuspid (valve) insufficiency: Secondary | ICD-10-CM

## 2019-01-11 DIAGNOSIS — I340 Nonrheumatic mitral (valve) insufficiency: Secondary | ICD-10-CM

## 2019-01-11 LAB — COMPREHENSIVE METABOLIC PANEL
ALT: 53 U/L — ABNORMAL HIGH (ref 0–44)
AST: 87 U/L — ABNORMAL HIGH (ref 15–41)
Albumin: 2.9 g/dL — ABNORMAL LOW (ref 3.5–5.0)
Alkaline Phosphatase: 99 U/L (ref 38–126)
Anion gap: 9 (ref 5–15)
BUN: 20 mg/dL (ref 8–23)
CO2: 23 mmol/L (ref 22–32)
Calcium: 8.5 mg/dL — ABNORMAL LOW (ref 8.9–10.3)
Chloride: 108 mmol/L (ref 98–111)
Creatinine, Ser: 0.72 mg/dL (ref 0.61–1.24)
GFR calc Af Amer: >60 mL/min (ref >60)
GFR calc non Af Amer: >60 mL/min (ref >60)
Glucose, Bld: 156 mg/dL — ABNORMAL HIGH (ref 70–99)
Potassium: 3.3 mmol/L — ABNORMAL LOW (ref 3.5–5.1)
Sodium: 140 mmol/L (ref 135–145)
Total Bilirubin: 1 mg/dL (ref 0.3–1.2)
Total Protein: 5.9 g/dL — ABNORMAL LOW (ref 6.5–8.1)

## 2019-01-11 LAB — TYPE AND SCREEN
ABO/RH(D): AB POS
Antibody Screen: NEGATIVE
Unit division: 0
Unit division: 0

## 2019-01-11 LAB — HEPATITIS PANEL, ACUTE
HCV Ab: <0.1 s/co ratio (ref 0.0–0.9)
Hep A IgM: NEGATIVE
Hep B C IgM: NEGATIVE
Hepatitis B Surface Ag: NEGATIVE

## 2019-01-11 LAB — ECHOCARDIOGRAM COMPLETE
Height: 68 in
Weight: 2825.42 oz

## 2019-01-11 LAB — CBC
HCT: 29.9 % — ABNORMAL LOW (ref 39.0–52.0)
Hemoglobin: 9.9 g/dL — ABNORMAL LOW (ref 13.0–17.0)
MCH: 31.4 pg (ref 26.0–34.0)
MCHC: 33.1 g/dL (ref 30.0–36.0)
MCV: 94.9 fL (ref 80.0–100.0)
Platelets: 65 10*3/uL — ABNORMAL LOW (ref 150–400)
RBC: 3.15 MIL/uL — ABNORMAL LOW (ref 4.22–5.81)
RDW: 18.2 % — ABNORMAL HIGH (ref 11.5–15.5)
WBC: 3.6 10*3/uL — ABNORMAL LOW (ref 4.0–10.5)
nRBC: 1.1 % — ABNORMAL HIGH (ref 0.0–0.2)

## 2019-01-11 LAB — BPAM RBC
Blood Product Expiration Date: 202005182359
Blood Product Expiration Date: 202005182359
ISSUE DATE / TIME: 202005111111
ISSUE DATE / TIME: 202005111502
Unit Type and Rh: 6200
Unit Type and Rh: 6200

## 2019-01-11 LAB — GLUCOSE, CAPILLARY
Glucose-Capillary: 134 mg/dL — ABNORMAL HIGH (ref 70–99)
Glucose-Capillary: 153 mg/dL — ABNORMAL HIGH (ref 70–99)
Glucose-Capillary: 165 mg/dL — ABNORMAL HIGH (ref 70–99)
Glucose-Capillary: 166 mg/dL — ABNORMAL HIGH (ref 70–99)
Glucose-Capillary: 186 mg/dL — ABNORMAL HIGH (ref 70–99)
Glucose-Capillary: 186 mg/dL — ABNORMAL HIGH (ref 70–99)
Glucose-Capillary: 191 mg/dL — ABNORMAL HIGH (ref 70–99)

## 2019-01-11 LAB — PROTEIN ELECTROPHORESIS, SERUM
A/G Ratio: 1.2 (ref 0.7–1.7)
Albumin ELP: 2.6 g/dL — ABNORMAL LOW (ref 2.9–4.4)
Alpha-1-Globulin: 0.3 g/dL (ref 0.0–0.4)
Alpha-2-Globulin: 0.6 g/dL (ref 0.4–1.0)
Beta Globulin: 0.8 g/dL (ref 0.7–1.3)
Gamma Globulin: 0.5 g/dL (ref 0.4–1.8)
Globulin, Total: 2.2 g/dL (ref 2.2–3.9)
M-Spike, %: 0.2 g/dL — ABNORMAL HIGH
Total Protein ELP: 4.8 g/dL — ABNORMAL LOW (ref 6.0–8.5)

## 2019-01-11 LAB — BLOOD GAS, ARTERIAL
Acid-Base Excess: 1.4 mmol/L (ref 0.0–2.0)
Bicarbonate: 25.6 mmol/L (ref 20.0–28.0)
FIO2: 40
O2 Saturation: 97.5 %
Patient temperature: 37
pCO2 arterial: 41.2 mmHg (ref 32.0–48.0)
pH, Arterial: 7.409 (ref 7.350–7.450)
pO2, Arterial: 99.7 mmHg (ref 83.0–108.0)

## 2019-01-11 LAB — FOLATE RBC
Folate, Hemolysate: 401 ng/mL
Folate, RBC: 1985 ng/mL (ref >498)
Hematocrit: 20.2 % — ABNORMAL LOW (ref 37.5–51.0)

## 2019-01-11 LAB — PROCALCITONIN: Procalcitonin: <0.1 ng/mL

## 2019-01-11 MED ORDER — POTASSIUM CHLORIDE 10 MEQ/100ML IV SOLN
10.0000 meq | INTRAVENOUS | Status: AC
  Administered 2019-01-11 (×3): 10 meq via INTRAVENOUS
  Filled 2019-01-11: qty 100
  Filled 2019-01-11 (×2): qty 50
  Filled 2019-01-11: qty 100
  Filled 2019-01-11: qty 50
  Filled 2019-01-11: qty 100

## 2019-01-11 MED ORDER — HYDROCORTISONE NA SUCCINATE PF 100 MG IJ SOLR
25.0000 mg | Freq: Two times a day (BID) | INTRAMUSCULAR | Status: DC
  Administered 2019-01-12 – 2019-01-14 (×5): 25 mg via INTRAVENOUS
  Filled 2019-01-11 (×5): qty 2

## 2019-01-11 MED ORDER — SODIUM CHLORIDE 0.9 % IV SOLN
INTRAVENOUS | Status: DC | PRN
  Administered 2019-01-11 – 2019-01-12 (×2): 250 mL via INTRAVENOUS

## 2019-01-11 MED ORDER — DEXMEDETOMIDINE HCL IN NACL 200 MCG/50ML IV SOLN
0.4000 ug/kg/h | INTRAVENOUS | Status: DC
  Administered 2019-01-11: 0.4 ug/kg/h via INTRAVENOUS
  Administered 2019-01-11: 0.7 ug/kg/h via INTRAVENOUS
  Administered 2019-01-11: 23:00:00 0.8 ug/kg/h via INTRAVENOUS
  Administered 2019-01-11 (×2): 0.4 ug/kg/h via INTRAVENOUS
  Administered 2019-01-12 (×2): 0.8 ug/kg/h via INTRAVENOUS
  Administered 2019-01-12: 08:00:00 0.9 ug/kg/h via INTRAVENOUS
  Administered 2019-01-12: 0.8 ug/kg/h via INTRAVENOUS
  Administered 2019-01-12 (×2): 0.9 ug/kg/h via INTRAVENOUS
  Administered 2019-01-12: 0.8 ug/kg/h via INTRAVENOUS
  Administered 2019-01-12: 0.9 ug/kg/h via INTRAVENOUS
  Administered 2019-01-13: 0.8 ug/kg/h via INTRAVENOUS
  Administered 2019-01-13: 13:00:00 0.499 ug/kg/h via INTRAVENOUS
  Administered 2019-01-13 (×2): 0.8 ug/kg/h via INTRAVENOUS
  Administered 2019-01-13: 0.5 ug/kg/h via INTRAVENOUS
  Administered 2019-01-14: 0.4 ug/kg/h via INTRAVENOUS
  Administered 2019-01-14 (×2): 0.5 ug/kg/h via INTRAVENOUS
  Filled 2019-01-11: qty 50
  Filled 2019-01-11: qty 150
  Filled 2019-01-11 (×12): qty 50
  Filled 2019-01-11: qty 150
  Filled 2019-01-11 (×3): qty 50

## 2019-01-11 MED ORDER — VITAL HIGH PROTEIN PO LIQD
1000.0000 mL | ORAL | Status: DC
  Administered 2019-01-11: 1000 mL
  Filled 2019-01-11 (×2): qty 1000

## 2019-01-11 MED ORDER — HYDRALAZINE HCL 20 MG/ML IJ SOLN: 10.0000 mg | Freq: Four times a day (QID) | INTRAMUSCULAR | Status: DC | PRN

## 2019-01-11 MED ORDER — HYDROCORTISONE NA SUCCINATE PF 100 MG IJ SOLR: 50.0000 mg | Freq: Two times a day (BID) | INTRAMUSCULAR | Status: DC

## 2019-01-11 NOTE — Progress Notes (Addendum)
PROGRESS NOTE  John Munoz ZYS:[id number] DOB: [birth year] DOA: 01/09/2019 PCP: Neale Burly, MD  Brief History:  85 year old male with a history of COPD, BPH, GERD, and anxiety presenting with altered mental status.  The patient is currently on a ventilator and is unable to provide any history.  Attempt to contact family was unsuccessful.  Apparently, the patient was noted to have somnolence and altered mental status by home health aide 2 days prior to this admission.  At baseline, the patient is alert and oriented and usually ambulates with a walker.  Apparently, the patient had a recent admission to [facility] from 12/01/2018 through 12/06/2018 where he was treated for right heel ulcer and infection.  In addition, the patient also recently finished antibiotics for presumptive UTI.  He was subsequently discharged home.  In the emergency department, the patient was noted to be somnolent minimally responsive to pain.  He was initially placed on BiPAP, but was subsequently intubated secondary to his inability to protect his airway.  The patient was initially hypothermic with a temperature 91.1 F and hypotensive.  Assessment/Plan: Sepsis -Present at time of admission -Secondary to HCAP -Check procalcitonin <0.10 -Continue vancomycin and cefepime pending culture data -Lactic acid peaked 0.9 -GI prophylaxis -Weaned off levophed evening 01/10/19 -5/11--started solucortef with improved CBGs (was hypoglycemic) and weaned off levophed--wean solucortef  Acute respiratory failure with hypoxia -Secondary to pneumonia -Pulmonary consult appreciated -Wean to extubation as tolerated -personally reviewed cXR--increased interstital markings, LL opacity  Lobar pneumonia/HCAP -01/09/2019 CT chest--LLL infiltrate with air bronchograms and left-sided effusion -Continue vancomycin  -d/c cefepime  -tracheal aspirate S. aureus -MRSA screen positive -COVID-19--NEG  Elevated troponin -likely due to  demand ischemia in the setting of sepsis -echo--EF 60%, no WMA, mild to moderate MR -personally reviewed EKG--sinus, nonspecific T wave changes -overall trend flat  FEN -hypokalemia--replete -check mag -start enteral feeding  Lower extremity edema -Venous duplex rule out DVT--neg  Acute diastolic CHF -BNP 9323 -Echocardiogram--EF 55-60%, no WMA, mild to mod MR -fluid resuscitated initially -CVP 10-12  Transaminasemia -Secondary to sepsis -Trend -hep B/C neg  Right heel decubitus ulcer--unstageable -Present at the time of admission -Does not appear to be clinically infected on examination -See pictures below -Wound care consult appreciated  Thrombocytopenia -due to sepsis -F57---3220 -RBC folic URKY--7062 -check coags--elevated PTT -fibrinogen--435  Anxiety/depression  -restart BuSpar and Celexa when able to tolerate oral intake      Disposition Plan:   Remain in ICU Family Communication:  No Family at bedside  Consultants:  pulm  Code Status:  FULL   DVT Prophylaxis:  SCDs   Procedures: As Listed in Progress Note Above  Antibiotics: vanco 5/11>>> Cefepime 5/11>>>   The patient is critically ill with multiple organ systems failure and requires high complexity decision making for assessment and support, frequent evaluation and titration of therapies, application of advanced monitoring technologies and extensive interpretation of multiple databases.  Critical care time - 35 mins        Subjective: Pt remains on ventilator.  No  Vomiting.  No respiratory distress. No diarrhea.  Agitated last night requiring precedex  Objective: Vitals:   01/11/19 1245 01/11/19 1300 01/11/19 1315 01/11/19 1324  BP: (!) 149/85 (!) 148/85 (!) 151/87   Pulse: 100 (!) 102 (!) 101 (!) 105  Resp: _0 (!) 22  Temp:      TempSrc:      SpO2: 96% 94% 95% 94%  Weight:      Height:        Intake/Output Summary (Last 24 hours) at  01/11/2019 1417 Last data filed at 01/11/2019 1124 Gross per 24 hour  Intake 2645.15 ml  Output 1953 ml  Net 692.15 ml   Weight change: 7.1 kg Exam:   General:  Intubated and sedated  HEENT: No icterus, No thrush, No neck mass, [place]/AT  Cardiovascular: RRR, S1/S2, no rubs, no gallops  Respiratory: bilateral scattered rhonchi no wheeze  Abdomen: Soft/+BS, non tender, non distended, no guarding  Extremities: No edema, No lymphangitis, No petechiae, No rashes, no synovitis   Data Reviewed: I have personally reviewed following labs and imaging studies Basic Metabolic Panel: Recent Labs  Lab 01/09/19 1113 01/10/19 0413 01/11/19 0419  NA 136 138 140  K 4.3 3.8 3.3*  CL 97* 103 108  CO2 _0 GLUCOSE 125* 69* 156*  BUN 25* 26* 20  CREATININE 0.54* 0.88 0.72  CALCIUM 8.9 8.4* 8.5*   Liver Function Tests: Recent Labs  Lab 01/09/19 1113 01/10/19 0413 01/11/19 0419  AST 63* 72* 87*  ALT 49* 47* 53*  ALKPHOS 106 95 99  BILITOT 0.5 0.5 1.0  PROT 6.0* 5.4* 5.9*  ALBUMIN 3.0* 2.7* 2.9*   No results for input(s): LIPASE, AMYLASE in the last 168 hours. No results for input(s): AMMONIA in the last 168 hours. Coagulation Profile: Recent Labs  Lab 01/10/19 0935  INR 1.1   CBC: Recent Labs  Lab 01/09/19 1113 01/10/19 0413 01/10/19 0546 01/10/19 2003 01/11/19 0419  WBC 3.7* 4.5  --   --  3.6*  NEUTROABS 2.9  --   --   --   --   HGB 8.0* 7.2*  --  9.5* 9.9*  HCT 23.7* 21.2* 20.2* 29.1* 29.9*  MCV 100.0 99.5  --   --  94.9  PLT 101* 84*  --   --  65*   Cardiac Enzymes: Recent Labs  Lab 01/09/19 1516 01/10/19 0413 01/10/19 0546 01/10/19 0935 01/10/19 1516 01/10/19 2003  CKTOTAL 551*  --   --   --   --   --   TROPONINI <0.03 0.46* 0.54* 0.78* 0.89* 0.93*   BNP: Invalid input(s): POCBNP CBG: Recent Labs  Lab 01/10/19 1939 01/11/19 0019 01/11/19 0448 01/11/19 0800 01/11/19 1123  GLUCAP 108* 134* 153* 165* 186*   HbA1C: No results for input(s):  HGBA1C in the last 72 hours. Urine analysis:    Component Value Date/Time   COLORURINE YELLOW 01/09/2019 1114   APPEARANCEUR CLEAR 01/09/2019 1114   LABSPEC 1.017 01/09/2019 1114   PHURINE 5.0 01/09/2019 1114   GLUCOSEU NEGATIVE 01/09/2019 1114   [place] 01/09/2019 [place] 01/09/2019 [place] 01/09/2019 [place] 01/09/2019 1114   NITRITE NEGATIVE 01/09/2019 [place] (A) 01/09/2019 1114   Sepsis Labs: _1 (procalcitonin:4,lacticidven:4) ) Recent Results (from the past 240 hour(s))  Blood Culture (routine x 2)     Status: None (Preliminary result)   Collection Time: 01/09/19 11:13 AM  Result Value Ref Range Status   Specimen Description BLOOD LEFT ARM BOTTLES DRAWN AEROBIC AND ANAEROBIC  Final   Special Requests Blood Culture adequate volume  Final   Culture   Final    NO GROWTH 2 DAYS Performed at [facility], [address] [place], [place] [address]    Report Status PENDING  Incomplete  Blood Culture (routine x 2)  Status: None (Preliminary result)   Collection Time: 01/09/19 11:35 AM  Result Value Ref Range Status   Specimen Description   Final    LEFT ANTECUBITAL BOTTLES DRAWN AEROBIC AND ANAEROBIC   Special Requests Blood Culture adequate volume  Final   Culture   Final    NO GROWTH 2 DAYS Performed at [facility], [address] [place], [place] [address]    Report Status PENDING  Incomplete  SARS Coronavirus 2 (CEPHEID - Performed in [place] hospital lab), Hosp Order     Status: None   Collection Time: 01/09/19  2:58 PM  Result Value Ref Range Status   SARS Coronavirus 2 NEGATIVE NEGATIVE Final    Comment: (NOTE) If result is NEGATIVE SARS-CoV-2 target nucleic acids are NOT DETECTED. The SARS-CoV-2 RNA is generally detectable in upper and lower  respiratory specimens during the acute phase of infection. The lowest  concentration of SARS-CoV-2 viral copies this assay can  detect is 250  copies / mL. A negative result does not preclude SARS-CoV-2 infection  and should not be used as the sole basis for treatment or other  patient management decisions.  A negative result may occur with  improper specimen collection / handling, submission of specimen other  than nasopharyngeal swab, presence of viral mutation(s) within the  areas targeted by this assay, and inadequate number of viral copies  (<250 copies / mL). A negative result must be combined with clinical  observations, patient history, and epidemiological information. If result is POSITIVE SARS-CoV-2 target nucleic acids are DETECTED. The SARS-CoV-2 RNA is generally detectable in upper and lower  respiratory specimens dur ing the acute phase of infection.  Positive  results are indicative of active infection with SARS-CoV-2.  Clinical  correlation with patient history and other diagnostic information is  necessary to determine patient infection status.  Positive results do  not rule out bacterial infection or co-infection with other viruses. If result is PRESUMPTIVE POSTIVE SARS-CoV-2 nucleic acids MAY BE PRESENT.   A presumptive positive result was obtained on the submitted specimen  and confirmed on repeat testing.  While 2019 novel coronavirus  (SARS-CoV-2) nucleic acids may be present in the submitted sample  additional confirmatory testing may be necessary for epidemiological  and / or clinical management purposes  to differentiate between  SARS-CoV-2 and other Sarbecovirus currently known to infect humans.  If clinically indicated additional testing with an alternate test  methodology [phone]) is advised. The SARS-CoV-2 RNA is generally  detectable in upper and lower respiratory sp ecimens during the acute  phase of infection. The expected result is Negative. Fact Sheet for Patients:  StrictlyIdeas.no Fact Sheet for Healthcare  Providers: BankingDealers.co.za This test is not yet approved or cleared by the [place] FDA and has been authorized for detection and/or diagnosis of SARS-CoV-2 by FDA under an Emergency Use Authorization (EUA).  This EUA will remain in effect (meaning this test can be used) for the duration of the COVID-19 declaration under Section 564(b)(1) of the Act, 21 U.S.C. section 360bbb-3(b)(1), unless the authorization is terminated or revoked sooner. Performed at [facility], [address], [place], [place] [address]   MRSA PCR Screening     Status: Abnormal   Collection Time: 01/09/19  8:47 PM  Result Value Ref Range Status   MRSA by PCR POSITIVE (A) NEGATIVE Final    Comment:        The GeneXpert MRSA Assay (FDA approved for NASAL specimens only), is one component of  a comprehensive MRSA colonization surveillance program. It is not intended to diagnose MRSA infection nor to guide or monitor treatment for MRSA infections. RESULT CALLED TO, READ BACK BY AND VERIFIED WITH: TETREAULT,H @ 0007 ON 01/10/19 BY JUW Performed at [facility], [address], [place], [place] [address]   Culture, respiratory     Status: None (Preliminary result)   Collection Time: 01/09/19 11:39 PM  Result Value Ref Range Status   Specimen Description   Final    TRACHEAL ASPIRATE Performed at [facility], [address], [place], [place] [address]    Special Requests   Final    NONE Performed at [facility], [address] [place], [place] [address]    Gram Stain   Final    MODERATE WBC PRESENT, PREDOMINANTLY PMN RARE GRAM POSITIVE COCCI RARE YEAST    Culture   Final    MODERATE STAPHYLOCOCCUS AUREUS SUSCEPTIBILITIES TO FOLLOW Performed at [facility], [place] [address] [place], [place] [address]    Report Status PENDING  Incomplete     Scheduled Meds:  albuterol  2.5 mg Nebulization Q4H   chlorhexidine gluconate (MEDLINE KIT)  15 mL Mouth Rinse BID    Chlorhexidine Gluconate Cloth  6 each Topical Q0600   Chlorhexidine Gluconate Cloth  6 each Topical Daily   collagenase   Topical Daily   enoxaparin (LOVENOX) injection  40 mg Subcutaneous Q24H   hydrocortisone sod succinate (SOLU-CORTEF) inj  50 mg Intravenous Q8H   mouth rinse  15 mL Mouth Rinse 10 times per day   mupirocin ointment  1 application Nasal BID   sodium chloride flush  10-40 mL Intracatheter Q12H   Continuous Infusions:  ceFEPime (MAXIPIME) IV 2 g (01/11/19 1216)   dexmedetomidine (PRECEDEX) IV infusion 0.4 mcg/kg/hr (01/11/19 1124)   famotidine (PEPCID) IV Stopped (01/11/19 0946)   feeding supplement (VITAL HIGH PROTEIN)     norepinephrine (LEVOPHED) Adult infusion Stopped (01/10/19 1633)   vancomycin 1,500 mg (01/11/19 1222)    Procedures/Studies: Ct Head Wo Contrast  Result Date: 01/09/2019 CLINICAL DATA:  85 year old male with altered mental status. EXAM: CT HEAD WITHOUT CONTRAST TECHNIQUE: Contiguous axial images were obtained from the base of the skull through the vertex without intravenous contrast. COMPARISON:  02/28/2017 CT and prior studies FINDINGS: Brain: No evidence of acute infarction, hemorrhage, hydrocephalus, extra-axial collection or mass lesion/mass effect. Atrophy and chronic small-vessel white matter ischemic changes again noted. Vascular: Atherosclerotic calcifications again noted Skull: Normal. Negative for fracture or focal lesion. Sinuses/Orbits: No acute finding. Other: None. IMPRESSION: 1. No evidence of acute intracranial abnormality. 2. Atrophy and chronic small-vessel white matter ischemic changes. Electronically Signed   By: Margarette Canada M.D.   On: 01/09/2019 15:20   Ct Chest W Contrast  Result Date: 01/09/2019 CLINICAL DATA:  Cough and pleural effusion EXAM: CT CHEST WITH CONTRAST TECHNIQUE: Multidetector CT imaging of the chest was performed during intravenous contrast administration. CONTRAST:  77m OMNIPAQUE 300 COMPARISON:  Plain  film from earlier in the same day. FINDINGS: Cardiovascular: Thoracic aorta and its branches demonstrate atherosclerotic calcifications without aneurysmal dilatation or dissection. Diffuse coronary calcifications are seen. The heart is at the upper limits of normal in size. Pulmonary artery is well visualized. No large central pulmonary embolus is seen. Timing was not performed for embolus evaluation. Mediastinum/Nodes: The esophagus as visualized is within normal limits. Thoracic inlet is within normal limits. Scattered small mediastinal lymph nodes are seen likely reactive in nature. Lungs/Pleura: Mild right lower lobe  atelectatic changes are noted with small right pleural effusion. A larger left-sided pleural effusion is noted with associated lower lobe infiltrate with air bronchograms consistent with pneumonia. These changes are similar to that seen on recent plain film. No parenchymal nodules are seen. Upper Abdomen: Prior cholecystectomy is noted. No acute abnormality is noted within the abdomen. Musculoskeletal: Degenerative changes of the thoracic spine are noted. IMPRESSION: Bilateral pleural effusions left greater than right. Left lower lobe infiltrate consistent with acute pneumonia. Mild right basilar atelectasis is seen. Aortic Atherosclerosis (ICD10-I70.0). Electronically Signed   By: Inez Catalina M.D.   On: 01/09/2019 15:17   US Venous Img Lower Bilateral  Result Date: 01/10/2019 CLINICAL DATA:  Bilateral lower extremity pain and edema. Evaluate for DVT. EXAM: BILATERAL LOWER EXTREMITY VENOUS DOPPLER ULTRASOUND TECHNIQUE: Gray-scale sonography with graded compression, as well as color Doppler and duplex ultrasound were performed to evaluate the lower extremity deep venous systems from the level of the common femoral vein and including the common femoral, femoral, profunda femoral, popliteal and calf veins including the posterior tibial, peroneal and gastrocnemius veins when visible. The  superficial great saphenous vein was also interrogated. Spectral Doppler was utilized to evaluate flow at rest and with distal augmentation maneuvers in the common femoral, femoral and popliteal veins. COMPARISON:  None. FINDINGS: RIGHT LOWER EXTREMITY The right common femoral vein, saphenofemoral junction and deep femoral vein were unable to be visualized secondary to central venous catheter bandages. Femoral Vein: No evidence of thrombus. Normal compressibility, respiratory phasicity and response to augmentation. Popliteal Vein: No evidence of thrombus. Normal compressibility, respiratory phasicity and response to augmentation. Calf Veins: No evidence of thrombus. Normal compressibility and flow on color Doppler imaging. Superficial Great Saphenous Vein: No evidence of thrombus. Normal compressibility. Venous Reflux:  None. Other Findings: Moderate amount of subcutaneous edema is noted at the level of the right thigh and lower leg. LEFT LOWER EXTREMITY Common Femoral Vein: No evidence of thrombus. Normal compressibility, respiratory phasicity and response to augmentation. Saphenofemoral Junction: No evidence of thrombus. Normal compressibility and flow on color Doppler imaging. Profunda Femoral Vein: No evidence of thrombus. Normal compressibility and flow on color Doppler imaging. Femoral Vein: No evidence of thrombus. Normal compressibility, respiratory phasicity and response to augmentation. Popliteal Vein: No evidence of thrombus. Normal compressibility, respiratory phasicity and response to augmentation. Calf Veins: No evidence of thrombus. Normal compressibility and flow on color Doppler imaging. Superficial Great Saphenous Vein: No evidence of thrombus. Normal compressibility. Venous Reflux:  None. Other Findings: Moderate amount of subcutaneous edema is noted at the level of the left lower leg. IMPRESSION: Degraded examination without evidence of DVT within either lower extremity. Electronically Signed    By: Sandi Mariscal M.D.   On: 01/10/2019 13:58   Dg Chest Port 1 View  Result Date: 01/11/2019 CLINICAL DATA:  Shortness of breath.  Respiratory failure. EXAM: PORTABLE CHEST 1 VIEW COMPARISON:  01/10/2019 FINDINGS: The endotracheal tube terminates above the carina by approximately 4.5 cm. The enteric tube it peers to extend below the left hemidiaphragm. The cardiac silhouette is enlarged. Aortic calcifications are noted. There is a left basilar/retrocardiac opacity, similar to prior study. No pneumothorax. Prominent interstitial lung markings are again noted IMPRESSION: 1. Lines and tubes as above. 2. Persistent left basilar airspace opacity, not significantly improved from prior study. Electronically Signed   By: Constance Holster M.D.   On: 01/11/2019 13:51   Dg Chest Port 1 View  Result Date: 01/10/2019 CLINICAL DATA:  Altered mental status. Pneumonia.  EXAM: PORTABLE CHEST 1 VIEW COMPARISON:  01/10/2019. FINDINGS: Rotated radiograph. Endotracheal tube appears approximately 2 cm above carina. Enteric tube last side hole is at GE junction. The heart is enlarged. There is considerable opacity in the LEFT hemithorax, probably stable, likely atelectasis, consolidation, and effusion. Moderate vascular congestion appears worse throughout the visible RIGHT lung fields. IMPRESSION: Worsening aeration. Support tubes as described. Electronically Signed   By: Staci Righter M.D.   On: 01/10/2019 10:29   Dg Chest Port 1 View  Result Date: 01/10/2019 CLINICAL DATA:  85 year old male with enteric tube placement. EXAM: PORTABLE CHEST 1 VIEW COMPARISON:  Chest radiograph dated 01/09/2019 FINDINGS: Evaluation is limited due to patient's positioning and portable technique. An enteric tube is noted with side-port just distal to the region of the gastroesophageal junction and tip in the proximal stomach. An endotracheal tube appears above the carina. No significant change in the appearance of the lungs or heart since the  earlier radiograph. IMPRESSION: Enteric tube with side-port just distal to the GE junction and tip in the proximal stomach. The tube can be further advanced by an additional 5-7 cm for optimal positioning. Electronically Signed   By: Anner Crete M.D.   On: 01/10/2019 02:49   Dg Chest Port 1 View  Result Date: 01/09/2019 CLINICAL DATA:  85 year old male status post enteric tube placement. EXAM: PORTABLE CHEST 1 VIEW COMPARISON:  Earlier radiograph dated 01/09/2019 FINDINGS: Endotracheal tube with tip above the carina in similar position. An enteric tube extends into the upper abdomen with tip likely in the proximal stomach. The side port of the enteric tube is in the region of the GE junction. Recommend further advancing of the tube by approximately 5 cm. Left-sided pleural effusion and associated atelectasis or infiltrate similar or slightly progressed since the prior radiograph. No other interval change. IMPRESSION: Enteric tube with side-port in the region of the GE junction and tip in the proximal stomach. Recommend further advancing the tube by approximately 5 cm. Electronically Signed   By: Anner Crete M.D.   On: 01/09/2019 23:47   Dg Chest Portable 1 View  Result Date: 01/09/2019 CLINICAL DATA:  85 year old male intubated. Left lower lobe consolidation, bilateral pleural effusions. EXAM: PORTABLE CHEST 1 VIEW COMPARISON:  Chest CT and radiographs earlier today FINDINGS: Portable AP supine view at 1759 hours. Endotracheal tube tip in good position between the level the clavicles and carina. Enteric tube courses to the left abdomen, tip not included. The side hole might be visible at the level of the distal esophagus (arrow). Dense left lung base opacification. Stable cardiac size and mediastinal contours. No pneumothorax. Stable pulmonary vascularity. No confluent right lung opacity. IMPRESSION: 1. ET tube in good position. 2. Enteric tube courses to the left abdomen but the side hole might  still be in the distal esophagus. Recommend advancing 5-6 more centimeters. 3. Left lower consolidation and effusion as seen by CT earlier today. Electronically Signed   By: Genevie Ann M.D.   On: 01/09/2019 18:22   Dg Chest Port 1 View  Result Date: 01/09/2019 CLINICAL DATA:  Altered mental status. EXAM: PORTABLE CHEST 1 VIEW COMPARISON:  Radiographs of December 01, 2018. FINDINGS: Stable cardiomegaly. Atherosclerosis of thoracic aorta is noted. No pneumothorax is noted. Bilateral interstitial densities are noted concerning for pulmonary edema. Mild left pleural effusion is noted with probable associated atelectasis or infiltrate. Bony thorax is unremarkable. IMPRESSION: Stable cardiomegaly with bilateral interstitial densities concerning for edema. Mild left pleural effusion is noted with  associated atelectasis or infiltrate. Aortic Atherosclerosis (ICD10-I70.0). Electronically Signed   By: Marijo Conception M.D.   On: 01/09/2019 11:50   US Abdomen Limited Ruq  Result Date: 01/10/2019 CLINICAL DATA:  Abnormal LFTs on admission. Previous cholecystectomy. EXAM: ULTRASOUND ABDOMEN LIMITED RIGHT UPPER QUADRANT COMPARISON:  None. FINDINGS: Gallbladder: Previous cholecystectomy. Common bile duct: Diameter: 8.1 mm. Liver: No focal lesion identified. Within normal limits in parenchymal echogenicity. Small amount of perihepatic fluid. Portal vein is patent on color Doppler imaging with normal direction of blood flow towards the liver. IMPRESSION: Small amount of perihepatic fluid, otherwise no acute findings. Previous cholecystectomy. Electronically Signed   By: Marin Olp M.D.   On: 01/10/2019 13:27    Orson Eva, DO  [facility] Pager [phone]  If 7PM-7AM, please contact night-coverage [email or web address] Password [email or web address] 01/11/2019, 2:17 PM   LOS: 2 days

## 2019-01-11 NOTE — Progress Notes (Signed)
Precedex started @ 0.68mcg per MD order. Will continue to monitor pt.

## 2019-01-11 NOTE — Progress Notes (Signed)
*  PRELIMINARY RESULTS* Echocardiogram 2D Echocardiogram has been performed.  John Munoz 01/11/2019, 10:34 AM

## 2019-01-11 NOTE — Progress Notes (Signed)
Pt is still trying to pull at tubes, very jittery and trying to move around the bed. Third dose of fentanyl given- dr. Myna Hidalgo paged to see if continuous sedation could be ordered for patient. Waiting for orders/call back. Will continue to monitor pt

## 2019-01-11 NOTE — Progress Notes (Signed)
MD Tat notified of pt's BP trending up 160s/90s. PRN order obtained.

## 2019-01-11 NOTE — Progress Notes (Signed)
Subjective: He remains intubated and on the ventilator.  He is now on Precedex because he became agitated with intermittent sedation.  He seems to be [place] now but may have received a as needed as well.  He was evaluated by palliative care and plans are to continue with aggressive treatment but not with resuscitation if it comes to that.  Objective: Vital signs in last 24 hours: Temp:  [98.7 F (37.1 C)-99.7 F (37.6 C)] 99.4 F (37.4 C) (05/12 0400) Pulse Rate:  [97-120] 101 (05/12 0723) Resp:  [4-27] 15 (05/12 0723) BP: (79-162)/(45-90) 141/90 (05/12 0715) SpO2:  [95 %-100 %] 99 % (05/12 0723) FiO2 (%):  [40 %-60 %] 40 % (05/12 0710) Weight:  [80.1 kg] 80.1 kg (05/12 0500) Weight change: 7.1 kg    Intake/Output from previous day: 05/11 0701 - 05/12 0700 In: 3450.5 [I.V.:1445; Blood:654.2; IV Piggyback:1351.3] Out: 2245 [Urine:1990; Emesis/NG output:30]  PHYSICAL EXAM General appearance: Intubated sedated on mechanical ventilation Resp: rhonchi bilaterally Cardio: regular rate and rhythm, S1, S2 normal, no murmur, click, rub or gallop GI: soft, non-tender; bowel sounds normal; no masses,  no organomegaly Extremities: extremities normal, atraumatic, no cyanosis or edema  Lab Results:  Results for orders placed or performed during the hospital encounter of 01/09/19 (from the past 48 hour(s))  CBG monitoring, ED     Status: Abnormal   Collection Time: 01/09/19 10:56 AM  Result Value Ref Range   Glucose-Capillary 55 (L) 70 - 99 mg/dL  Comprehensive metabolic panel     Status: Abnormal   Collection Time: 01/09/19 11:13 AM  Result Value Ref Range   Sodium 136 135 - 145 mmol/L   Potassium 4.3 3.5 - 5.1 mmol/L   Chloride 97 (L) 98 - 111 mmol/L   CO2 29 22 - 32 mmol/L   Glucose, Bld 125 (H) 70 - 99 mg/dL   BUN 25 (H) 8 - 23 mg/dL   Creatinine, Ser 0.54 (L) 0.61 - 1.24 mg/dL   Calcium 8.9 8.9 - 10.3 mg/dL   Total Protein 6.0 (L) 6.5 - 8.1 g/dL   Albumin 3.0 (L) 3.5 - 5.0 g/dL    AST 63 (H) 15 - 41 U/L   ALT 49 (H) 0 - 44 U/L   Alkaline Phosphatase 106 38 - 126 U/L   Total Bilirubin 0.5 0.3 - 1.2 mg/dL   GFR calc non Af Amer >60 >60 mL/min   GFR calc Af Amer >60 >60 mL/min   Anion gap 10 5 - 15    Comment: Performed at [facility], [address] [place], [place] [address]  Lactic acid, plasma     Status: None   Collection Time: 01/09/19 11:13 AM  Result Value Ref Range   Lactic Acid, Venous 0.9 0.5 - 1.9 mmol/L    Comment: Performed at [facility], [address], [place], [place] [address]  CBC WITH DIFFERENTIAL     Status: Abnormal   Collection Time: 01/09/19 11:13 AM  Result Value Ref Range   WBC 3.7 (L) 4.0 - 10.5 K/uL    Comment: REPEATED TO VERIFY WHITE COUNT CONFIRMED ON SMEAR    RBC 2.37 (L) 4.22 - 5.81 MIL/uL   Hemoglobin 8.0 (L) 13.0 - 17.0 g/dL   HCT 23.7 (L) 39.0 - 52.0 %   MCV 100.0 80.0 - 100.0 fL   MCH 33.8 26.0 - 34.0 pg   MCHC 33.8 30.0 - 36.0 g/dL   RDW 15.6 (H) 11.5 - 15.5 %   Platelets 101 (L)  150 - 400 K/uL    Comment: REPEATED TO VERIFY PLATELET COUNT CONFIRMED BY SMEAR SPECIMEN CHECKED FOR CLOTS Immature Platelet Fraction may be clinically indicated, consider ordering this additional test [id number]    nRBC 0.0 0.0 - 0.2 %   Neutrophils Relative % 78 %   Neutro Abs 2.9 1.7 - 7.7 K/uL   Lymphocytes Relative 14 %   Lymphs Abs 0.5 (L) 0.7 - 4.0 K/uL   Monocytes Relative 7 %   Monocytes Absolute 0.2 0.1 - 1.0 K/uL   Eosinophils Relative 1 %   Eosinophils Absolute 0.0 0.0 - 0.5 K/uL   Basophils Relative 0 %   Basophils Absolute 0.0 0.0 - 0.1 K/uL   WBC Morphology INCREASED BANDS (>20% BANDS)    Immature Granulocytes 0 %   Abs Immature Granulocytes 0.00 0.00 - 0.07 K/uL    Comment: Performed at [facility], [address] [place], [place] [address]  Blood Culture (routine x 2)     Status: None (Preliminary result)   Collection Time: 01/09/19 11:13 AM  Result Value Ref Range   Specimen Description BLOOD LEFT ARM  BOTTLES DRAWN AEROBIC AND ANAEROBIC    Special Requests Blood Culture adequate volume    Culture      NO GROWTH < 24 HOURS Performed at [facility], [address] [place], [place] [address]    Report Status PENDING   Brain natriuretic peptide     Status: Abnormal   Collection Time: 01/09/19 11:13 AM  Result Value Ref Range   B Natriuretic Peptide 1,175.0 (H) 0.0 - 100.0 pg/mL    Comment: Performed at [facility], [address] [place], [place] [address]  Urinalysis, Routine w reflex microscopic     Status: Abnormal   Collection Time: 01/09/19 11:14 AM  Result Value Ref Range   Color, Urine YELLOW YELLOW   APPearance CLEAR CLEAR   Specific Gravity, Urine 1.017 1.005 - 1.030   pH 5.0 5.0 - 8.0   Glucose, UA NEGATIVE NEGATIVE mg/dL   Hgb urine dipstick NEGATIVE NEGATIVE   Bilirubin Urine NEGATIVE NEGATIVE   Ketones, ur NEGATIVE NEGATIVE mg/dL   Protein, ur NEGATIVE NEGATIVE mg/dL   Nitrite NEGATIVE NEGATIVE   Leukocytes,Ua TRACE (A) NEGATIVE   RBC / HPF 0-5 0 - 5 RBC/hpf   WBC, UA 11-20 0 - 5 WBC/hpf   Bacteria, UA RARE (A) NONE SEEN   Squamous Epithelial / LPF 0-5 0 - 5   Trans Epithel, UA 1    Mucus PRESENT    Hyaline Casts, UA PRESENT     Comment: Performed at [facility], [address], [place], [place] [address]  CBG monitoring, ED     Status: Abnormal   Collection Time: 01/09/19 11:34 AM  Result Value Ref Range   Glucose-Capillary 101 (H) 70 - 99 mg/dL  Blood Culture (routine x 2)     Status: None (Preliminary result)   Collection Time: 01/09/19 11:35 AM  Result Value Ref Range   Specimen Description      LEFT ANTECUBITAL BOTTLES DRAWN AEROBIC AND ANAEROBIC   Special Requests Blood Culture adequate volume    Culture      NO GROWTH < 24 HOURS Performed at [facility], [address] [place], [place] [address]    Report Status PENDING   Lactic acid, plasma     Status: None   Collection Time: 01/09/19  1:03 PM  Result Value Ref Range   Lactic Acid, Venous 0.6  0.5 - 1.9  mmol/L    Comment: Performed at [facility], [address], [place], [place] [address]  SARS Coronavirus 2 ([place] - Performed in [facility] hospital lab), Hosp Order     Status: None   Collection Time: 01/09/19  2:58 PM  Result Value Ref Range   SARS Coronavirus 2 NEGATIVE NEGATIVE    Comment: (NOTE) If result is NEGATIVE SARS-CoV-2 target nucleic acids are NOT DETECTED. The SARS-CoV-2 RNA is generally detectable in upper and lower  respiratory specimens during the acute phase of infection. The lowest  concentration of SARS-CoV-2 viral copies this assay can detect is 250  copies / mL. A negative result does not preclude SARS-CoV-2 infection  and should not be used as the sole basis for treatment or other  patient management decisions.  A negative result may occur with  improper specimen collection / handling, submission of specimen other  than nasopharyngeal swab, presence of viral mutation(s) within the  areas targeted by this assay, and inadequate number of viral copies  (<250 copies / mL). A negative result must be combined with clinical  observations, patient history, and epidemiological information. If result is POSITIVE SARS-CoV-2 target nucleic acids are DETECTED. The SARS-CoV-2 RNA is generally detectable in upper and lower  respiratory specimens dur ing the acute phase of infection.  Positive  results are indicative of active infection with SARS-CoV-2.  Clinical  correlation with patient history and other diagnostic information is  necessary to determine patient infection status.  Positive results do  not rule out bacterial infection or co-infection with other viruses. If result is PRESUMPTIVE POSTIVE SARS-CoV-2 nucleic acids MAY BE PRESENT.   A presumptive positive result was obtained on the submitted specimen  and confirmed on repeat testing.  While 2019 novel coronavirus  (SARS-CoV-2) nucleic acids may be present in the submitted sample  additional  confirmatory testing may be necessary for epidemiological  and / or clinical management purposes  to differentiate between  SARS-CoV-2 and other Sarbecovirus currently known to infect humans.  If clinically indicated additional testing with an alternate test  methodology [id number]) is advised. The SARS-CoV-2 RNA is generally  detectable in upper and lower respiratory sp ecimens during the acute  phase of infection. The expected result is Negative. Fact Sheet for Patients:  StrictlyIdeas.no Fact Sheet for Healthcare Providers: BankingDealers.co.za This test is not yet approved or cleared by the [place] FDA and has been authorized for detection and/or diagnosis of SARS-CoV-2 by FDA under an Emergency Use Authorization (EUA).  This EUA will remain in effect (meaning this test can be used) for the duration of the COVID-19 declaration under Section 564(b)(1) of the Act, 21 U.S.C. section 360bbb-3(b)(1), unless the authorization is terminated or revoked sooner. Performed at [facility], [address], [place], [place] [address]   Troponin I - Once     Status: None   Collection Time: 01/09/19  3:16 PM  Result Value Ref Range   Troponin I <0.03 <0.03 ng/mL    Comment: Performed at [facility], [address] [place],  [address]  Type and screen [facility]     Status: None (Preliminary result)   Collection Time: 01/09/19  3:16 PM  Result Value Ref Range   ABO/RH(D) AB POS    Antibody Screen NEG    Sample Expiration 01/12/2019,2359    Unit Number [id number]    Blood Component Type RED CELLS,LR    Unit division 00    Status of Unit ISSUED  Transfusion Status OK TO TRANSFUSE    Crossmatch Result Compatible    Unit Number [id number]    Blood Component Type RED CELLS,LR    Unit division 00    Status of Unit ISSUED    Transfusion Status OK TO TRANSFUSE    Crossmatch Result      Compatible Performed at [facility], [address], [place], [place] [address]   CK     Status: Abnormal   Collection Time: 01/09/19  3:16 PM  Result Value Ref Range   Total CK 551 (H) 49 - 397 U/L    Comment: Performed at [facility], [address], [place], [place] [address]  Triglycerides     Status: None   Collection Time: 01/09/19  3:16 PM  Result Value Ref Range   Triglycerides 47 <150 mg/dL    Comment: Performed at [facility], [address], [place], [place] [address]  Blood gas, arterial (at [facility])     Status: Abnormal   Collection Time: 01/09/19  3:35 PM  Result Value Ref Range   FIO2 21.00    pH, Arterial 7.423 7.350 - 7.450   pCO2 arterial 47.4 32.0 - 48.0 mmHg   pO2, Arterial 34.9 (LL) 83.0 - 108.0 mmHg    Comment: CRITICAL RESULT CALLED TO, READ BACK BY AND VERIFIED WITH: MINTER,B'@1550'  BY MATTHEWS, B 5.10.2020    Bicarbonate 29.2 (H) 20.0 - 28.0 mmol/L   Acid-Base Excess 6.0 (H) 0.0 - 2.0 mmol/L   O2 Saturation 62.2 %   Patient temperature 37.4    Allens test (pass/fail) BRACHIAL ARTERY (A) PASS    Comment: Performed at [facility], [address], [place], [place] [address]  Blood gas, arterial     Status: Abnormal   Collection Time: 01/09/19  4:25 PM  Result Value Ref Range   FIO2 21.00    pH, Arterial 7.430 7.350 - 7.450   pCO2 arterial 45.1 32.0 - 48.0 mmHg   pO2, Arterial 52.1 (L) 83.0 - 108.0 mmHg   Bicarbonate 28.8 (H) 20.0 - 28.0 mmol/L   Acid-Base Excess 5.2 (H) 0.0 - 2.0 mmol/L   O2 Saturation 84.9 %   Patient temperature 37.4    Allens test (pass/fail) PASS PASS    Comment: Performed at [facility], [address] [place], [place] [address]  POC occult blood, ED RN will collect     Status: None   Collection Time: 01/09/19  5:19 PM  Result Value Ref Range   Fecal Occult Bld NEGATIVE NEGATIVE  Blood gas, arterial     Status: Abnormal   Collection Time: 01/09/19  7:25 PM  Result Value Ref Range   FIO2 56.00    Delivery systems PRESSURE REGULATED VOLUME CONTROL    Mode  VENTILATOR    VT 500 mL   LHR 16 resp/min   Peep/cpap 5.0 cm H20   pH, Arterial 7.457 (H) 7.350 - 7.450   pCO2 arterial 41.1 32.0 - 48.0 mmHg   pO2, Arterial 112 (H) 83.0 - 108.0 mmHg   Bicarbonate 28.8 (H) 20.0 - 28.0 mmol/L   Acid-Base Excess 4.8 (H) 0.0 - 2.0 mmol/L   O2 Saturation 98.4 %   Patient temperature 37.0    Allens test (pass/fail) PASS PASS    Comment: Performed at [facility], [address], [place], [place] [address]  Strep pneumoniae urinary antigen  (not at [facility])     Status: None   Collection Time: 01/09/19  8:47 PM  Result Value  Ref Range   Strep Pneumo Urinary Antigen NEGATIVE NEGATIVE    Comment:        Infection due to S. pneumoniae cannot be absolutely ruled out since the antigen present may be below the detection limit of the test. Performed at [facility], [address], [place], [place] [address]   MRSA PCR Screening     Status: Abnormal   Collection Time: 01/09/19  8:47 PM  Result Value Ref Range   MRSA by PCR POSITIVE (A) NEGATIVE    Comment:        The GeneXpert MRSA Assay (FDA approved for NASAL specimens only), is one component of a comprehensive MRSA colonization surveillance program. It is not intended to diagnose MRSA infection nor to guide or monitor treatment for MRSA infections. RESULT CALLED TO, READ BACK BY AND VERIFIED WITH: TETREAULT,H @ 0007 ON 01/10/19 BY JUW Performed at [facility], [address] [place], [place] [address]   Culture, respiratory     Status: None (Preliminary result)   Collection Time: 01/09/19 11:39 PM  Result Value Ref Range   Specimen Description      TRACHEAL ASPIRATE Performed at [facility], [address], [place], [place] [address]    Special Requests      NONE Performed at [facility], [address], [place], [place] [address]    Gram Stain      MODERATE WBC PRESENT, PREDOMINANTLY PMN RARE GRAM POSITIVE COCCI RARE YEAST Performed at [facility], [place] [address]  [place], [place] [address]    Culture PENDING    Report Status PENDING   Glucose, capillary     Status: None   Collection Time: 01/09/19 11:56 PM  Result Value Ref Range   Glucose-Capillary 73 70 - 99 mg/dL   Comment 1 Notify RN    Comment 2 Document in Chart   CBC     Status: Abnormal   Collection Time: 01/10/19  4:13 AM  Result Value Ref Range   WBC 4.5 4.0 - 10.5 K/uL   RBC 2.13 (L) 4.22 - 5.81 MIL/uL   Hemoglobin 7.2 (L) 13.0 - 17.0 g/dL   HCT 21.2 (L) 39.0 - 52.0 %   MCV 99.5 80.0 - 100.0 fL   MCH 33.8 26.0 - 34.0 pg   MCHC 34.0 30.0 - 36.0 g/dL   RDW 16.1 (H) 11.5 - 15.5 %   Platelets 84 (L) 150 - 400 K/uL    Comment: SPECIMEN CHECKED FOR CLOTS   nRBC 0.0 0.0 - 0.2 %    Comment: Performed at [facility], [address], [place], [place] [address]  Basic metabolic panel     Status: Abnormal   Collection Time: 01/10/19  4:13 AM  Result Value Ref Range   Sodium 138 135 - 145 mmol/L   Potassium 3.8 3.5 - 5.1 mmol/L   Chloride 103 98 - 111 mmol/L   CO2 26 22 - 32 mmol/L   Glucose, Bld 69 (L) 70 - 99 mg/dL   BUN 26 (H) 8 - 23 mg/dL   Creatinine, Ser 0.88 0.61 - 1.24 mg/dL   Calcium 8.4 (L) 8.9 - 10.3 mg/dL   GFR calc non Af Amer >60 >60 mL/min   GFR calc Af Amer >60 >60 mL/min   Anion gap 9 5 - 15    Comment: Performed at [facility], [address], [place], [place] [address]  Hepatic function panel     Status: Abnormal   Collection Time:  01/10/19  4:13 AM  Result Value Ref Range   Total Protein 5.4 (L) 6.5 - 8.1 g/dL   Albumin 2.7 (L) 3.5 - 5.0 g/dL   AST 72 (H) 15 - 41 U/L   ALT 47 (H) 0 - 44 U/L   Alkaline Phosphatase 95 38 - 126 U/L   Total Bilirubin 0.5 0.3 - 1.2 mg/dL   Bilirubin, Direct 0.1 0.0 - 0.2 mg/dL   Indirect Bilirubin 0.4 0.3 - 0.9 mg/dL    Comment: Performed at [facility], [address], [place], [place] [address]  Troponin I - Once     Status: Abnormal   Collection Time: 01/10/19  4:13 AM  Result Value Ref Range   Troponin I 0.46 (HH) <0.03 ng/mL     Comment: CRITICAL RESULT CALLED TO, READ BACK BY AND VERIFIED WITH: CUMMINGS,R AT 5:15AM ON 01/10/19 BY [facility] Performed at [facility], [address], [place], [place] [address]   Cortisol     Status: None   Collection Time: 01/10/19  4:14 AM  Result Value Ref Range   Cortisol, Plasma 12.0 ug/dL    Comment: (NOTE) AM    6.7 - 22.6 ug/dL PM   <10.0       ug/dL Performed at [facility] [address] [place], [place] [address]   Blood gas, arterial     Status: Abnormal   Collection Time: 01/10/19  4:30 AM  Result Value Ref Range   FIO2 40.00    pH, Arterial 7.483 (H) 7.350 - 7.450   pCO2 arterial 36.3 32.0 - 48.0 mmHg   pO2, Arterial 52.9 (L) 83.0 - 108.0 mmHg   Bicarbonate 27.5 20.0 - 28.0 mmol/L   Acid-Base Excess 3.5 (H) 0.0 - 2.0 mmol/L   O2 Saturation 87.1 %   Patient temperature 37.4    Allens test (pass/fail) PASS PASS    Comment: Performed at [facility], [address] [place], [place] [address]  Glucose, capillary     Status: None   Collection Time: 01/10/19  4:36 AM  Result Value Ref Range   Glucose-Capillary 73 70 - 99 mg/dL   Comment 1 Notify RN    Comment 2 Document in Chart   Troponin I - Now Then Q6H     Status: Abnormal   Collection Time: 01/10/19  5:46 AM  Result Value Ref Range   Troponin I 0.54 (HH) <0.03 ng/mL    Comment: CRITICAL RESULT CALLED TO, READ BACK BY AND VERIFIED WITH: DISHMON,M AT 7:30AM ON 01/10/19 BY [facility] Performed at [facility], [address], [place], [place] [address]   Ferritin     Status: None   Collection Time: 01/10/19  5:46 AM  Result Value Ref Range   Ferritin 266 24 - 336 ng/mL    Comment: Performed at [facility], [address] [place], [place] [address]  Iron and TIBC     Status: None   Collection Time: 01/10/19  5:46 AM  Result Value Ref Range   Iron 75 45 - 182 ug/dL   TIBC 277 250 - 450 ug/dL   Saturation Ratios 27 17.9 - 39.5 %   UIBC 202 ug/dL    Comment: Performed at [facility], [address], [place], [place] [address]  Vitamin B12     Status: Abnormal   Collection Time: 01/10/19  5:46 AM  Result Value Ref Range   Vitamin B-12 1,331 (H) 180 - 914 pg/mL    Comment: (NOTE) This assay  is not validated for testing neonatal or myeloproliferative syndrome specimens for Vitamin B12 levels. Performed at [facility], [address] [place], [place] [address]   Sedimentation rate     Status: Abnormal   Collection Time: 01/10/19  5:46 AM  Result Value Ref Range   Sed Rate 40 (H) 0 - 16 mm/hr    Comment: Performed at [facility], [address], [place], [place] [address]  Prepare RBC     Status: None   Collection Time: 01/10/19  5:46 AM  Result Value Ref Range   Order Confirmation      ORDER PROCESSED BY BLOOD BANK Performed at [facility], [address] [place],  [address]   Hepatitis panel, acute     Status: None   Collection Time: 01/10/19  5:46 AM  Result Value Ref Range   Hepatitis B Surface Ag Negative Negative   HCV Ab <0.1 0.0 - 0.9 s/co ratio    Comment: (NOTE)                                  Negative:     < 0.8                             Indeterminate: 0.8 - 0.9                                  Positive:     > 0.9 The CDC recommends that a positive HCV antibody result be followed up with a HCV Nucleic Acid Amplification test (850277). Performed At: [facility] [place], [place] [id number] Rush Farmer MD [id number]    Hep A IgM Negative Negative   Hep B C IgM Negative Negative  Procalcitonin - Baseline     Status: None   Collection Time: 01/10/19  5:46 AM  Result Value Ref Range   Procalcitonin <0.10 ng/mL    Comment:        Interpretation: PCT (Procalcitonin) <= 0.5 ng/mL: Systemic infection (sepsis) is not likely. Local bacterial infection is possible. (NOTE)       Sepsis PCT Algorithm           Lower Respiratory Tract                                      Infection PCT Algorithm     ----------------------------     ----------------------------         PCT < 0.25 ng/mL                PCT < 0.10 ng/mL         Strongly encourage             Strongly discourage   discontinuation of antibiotics    initiation of antibiotics    ----------------------------     -----------------------------       PCT 0.25 - 0.50 ng/mL            PCT 0.10 - 0.25 ng/mL               OR       >80% decrease in PCT            Discourage initiation of  antibiotics      Encourage discontinuation           of antibiotics    ----------------------------     -----------------------------         PCT >= 0.50 ng/mL              PCT 0.26 - 0.50 ng/mL               AND        <80% decrease in PCT             Encourage initiation of                                             antibiotics       Encourage continuation           of antibiotics    ----------------------------     -----------------------------        PCT >= 0.50 ng/mL                  PCT > 0.50 ng/mL               AND         increase in PCT                  Strongly encourage                                      initiation of antibiotics    Strongly encourage escalation           of antibiotics                                     -----------------------------                                           PCT <= 0.25 ng/mL                                                 OR                                        > 80% decrease in PCT                                     Discontinue / Do not initiate                                             antibiotics Performed at [facility], [address], [place], [place] [address]   Glucose, capillary     Status: None   Collection Time: 01/10/19  7:43 AM  Result Value Ref Range   Glucose-Capillary 82 70 - 99 mg/dL  Folate     Status: None   Collection Time: 01/10/19  9:35 AM  Result Value Ref Range   Folate 18.8 >5.9 ng/mL    Comment: Performed at [facility], [address] [place], [place] [address]  Fibrinogen     Status: None   Collection Time: 01/10/19  9:35 AM  Result Value Ref Range   Fibrinogen 435 210 - 475 mg/dL    Comment: Performed at [facility], [address] [place], [place] [address]  Protime-INR     Status: None   Collection Time: 01/10/19  9:35 AM  Result Value Ref Range   Prothrombin Time 14.2 11.4 - 15.2 seconds   INR 1.1 0.8 - 1.2    Comment: (NOTE) INR goal varies based on device and disease states. Performed at [facility], [address], [place], [place] [address]   APTT     Status: Abnormal   Collection Time: 01/10/19  9:35 AM  Result Value Ref Range   aPTT 46 (H) 24 - 36 seconds    Comment:        IF BASELINE aPTT IS ELEVATED, SUGGEST PATIENT RISK ASSESSMENT BE USED TO DETERMINE APPROPRIATE ANTICOAGULANT THERAPY. Performed at [facility], [address], [place], [place] [address]   Troponin I - Once     Status: Abnormal   Collection Time: 01/10/19  9:35 AM  Result Value Ref Range   Troponin I 0.78 (HH) <0.03 ng/mL    Comment: CRITICAL RESULT CALLED TO, READ BACK BY AND VERIFIED WITH: MURPHY,E AT 10:20AM ON 01/10/19 BY [facility] Performed at [facility], [address], [place], [place] [address]   Occult blood card to lab, stool RN will collect     Status: None   Collection Time: 01/10/19 10:00 AM  Result Value Ref Range   Fecal Occult Bld NEGATIVE NEGATIVE    Comment: Performed at [facility], [address] [place], [place] [address]  Glucose, capillary     Status: None   Collection Time: 01/10/19 11:41 AM  Result Value Ref Range   Glucose-Capillary 85 70 - 99 mg/dL  ABO/Rh     Status: None   Collection Time: 01/10/19  3:16 PM  Result Value Ref Range   ABO/RH(D)      AB POS Performed at [facility], [address], [place], [place] [address]   Troponin I - Now Then Q6H     Status: Abnormal   Collection Time: 01/10/19  3:16 PM  Result Value Ref Range   Troponin I 0.89 (HH) <0.03  ng/mL    Comment: CRITICAL VALUE NOTED.  VALUE IS CONSISTENT WITH PREVIOUSLY REPORTED AND CALLED VALUE. Performed at [facility], [address], [place], [place] [address]   Glucose, capillary     Status: None   Collection Time: 01/10/19  4:30 PM  Result Value Ref Range   Glucose-Capillary 97 70 - 99 mg/dL  Glucose, capillary     Status: Abnormal   Collection Time: 01/10/19  7:39 PM  Result Value Ref Range   Glucose-Capillary 108 (H) 70 - 99 mg/dL  Troponin I - Now Then Q6H     Status: Abnormal   Collection Time: 01/10/19  8:03 PM  Result Value Ref Range   Troponin I 0.93 (HH) <0.03 ng/mL    Comment: CRITICAL VALUE NOTED.  VALUE IS CONSISTENT WITH PREVIOUSLY REPORTED AND CALLED VALUE. Performed at [facility], [phone]  [address] [place], [place] [address]   Hemoglobin and hematocrit, blood     Status: Abnormal   Collection Time: 01/10/19  8:03 PM  Result Value Ref Range   Hemoglobin 9.5 (L) 13.0 - 17.0 g/dL    Comment: POST TRANSFUSION SPECIMEN   HCT 29.1 (L) 39.0 - 52.0 %    Comment: Performed at [facility], [address] [place], [place] [address]  Glucose, capillary     Status: Abnormal   Collection Time: 01/11/19 12:19 AM  Result Value Ref Range   Glucose-Capillary 134 (H) 70 - 99 mg/dL  Procalcitonin     Status: None   Collection Time: 01/11/19  4:19 AM  Result Value Ref Range   Procalcitonin <0.10 ng/mL    Comment:        Interpretation: PCT (Procalcitonin) <= 0.5 ng/mL: Systemic infection (sepsis) is not likely. Local bacterial infection is possible. (NOTE)       Sepsis PCT Algorithm           Lower Respiratory Tract                                      Infection PCT Algorithm    ----------------------------     ----------------------------         PCT < 0.25 ng/mL                PCT < 0.10 ng/mL         Strongly encourage             Strongly discourage   discontinuation of antibiotics    initiation of antibiotics    ----------------------------      -----------------------------       PCT 0.25 - 0.50 ng/mL            PCT 0.10 - 0.25 ng/mL               OR       >80% decrease in PCT            Discourage initiation of                                            antibiotics      Encourage discontinuation           of antibiotics    ----------------------------     -----------------------------         PCT >= 0.50 ng/mL              PCT 0.26 - 0.50 ng/mL               AND        <80% decrease in PCT             Encourage initiation of                                             antibiotics       Encourage continuation           of antibiotics    ----------------------------     -----------------------------        PCT >= 0.50 ng/mL  PCT > 0.50 ng/mL               AND         increase in PCT                  Strongly encourage                                      initiation of antibiotics    Strongly encourage escalation           of antibiotics                                     -----------------------------                                           PCT <= 0.25 ng/mL                                                 OR                                        > 80% decrease in PCT                                     Discontinue / Do not initiate                                             antibiotics Performed at [facility], [address] [place], [place] [address]   Comprehensive metabolic panel     Status: Abnormal   Collection Time: 01/11/19  4:19 AM  Result Value Ref Range   Sodium 140 135 - 145 mmol/L   Potassium 3.3 (L) 3.5 - 5.1 mmol/L   Chloride 108 98 - 111 mmol/L   CO2 23 22 - 32 mmol/L   Glucose, Bld 156 (H) 70 - 99 mg/dL   BUN 20 8 - 23 mg/dL   Creatinine, Ser 0.72 0.61 - 1.24 mg/dL   Calcium 8.5 (L) 8.9 - 10.3 mg/dL   Total Protein 5.9 (L) 6.5 - 8.1 g/dL   Albumin 2.9 (L) 3.5 - 5.0 g/dL   AST 87 (H) 15 - 41 U/L   ALT 53 (H) 0 - 44 U/L   Alkaline Phosphatase 99 38 - 126 U/L   Total Bilirubin  1.0 0.3 - 1.2 mg/dL   GFR calc non Af Amer >60 >60 mL/min   GFR calc Af Amer >60 >60 mL/min   Anion gap 9 5 - 15    Comment: Performed at [facility], [address], [place],  [address]  CBC     Status: Abnormal   Collection Time: 01/11/19  4:19 AM  Result Value Ref Range   WBC 3.6 (L) 4.0 - 10.5  K/uL   RBC 3.15 (L) 4.22 - 5.81 MIL/uL   Hemoglobin 9.9 (L) 13.0 - 17.0 g/dL   HCT 29.9 (L) 39.0 - 52.0 %   MCV 94.9 80.0 - 100.0 fL   MCH 31.4 26.0 - 34.0 pg   MCHC 33.1 30.0 - 36.0 g/dL   RDW 18.2 (H) 11.5 - 15.5 %   Platelets 65 (L) 150 - 400 K/uL    Comment: SPECIMEN CHECKED FOR CLOTS Immature Platelet Fraction may be clinically indicated, consider ordering this additional test [id number]    nRBC 1.1 (H) 0.0 - 0.2 %    Comment: Performed at [facility], [address], [place], [place] [address]  Glucose, capillary     Status: Abnormal   Collection Time: 01/11/19  4:48 AM  Result Value Ref Range   Glucose-Capillary 153 (H) 70 - 99 mg/dL  Blood gas, arterial     Status: None   Collection Time: 01/11/19  5:00 AM  Result Value Ref Range   FIO2 40.00    pH, Arterial 7.409 7.350 - 7.450   pCO2 arterial 41.2 32.0 - 48.0 mmHg   pO2, Arterial 99.7 83.0 - 108.0 mmHg   Bicarbonate 25.6 20.0 - 28.0 mmol/L   Acid-Base Excess 1.4 0.0 - 2.0 mmol/L   O2 Saturation 97.5 %   Patient temperature 37.0    Allens test (pass/fail) PASS PASS    Comment: Performed at [facility], [address] [place], [place] [address]    ABGS Recent Labs    01/11/19 0500  PHART 7.409  PO2ART 99.7  HCO3 25.6   CULTURES Recent Results (from the past 240 hour(s))  Blood Culture (routine x 2)     Status: None (Preliminary result)   Collection Time: 01/09/19 11:13 AM  Result Value Ref Range Status   Specimen Description BLOOD LEFT ARM BOTTLES DRAWN AEROBIC AND ANAEROBIC  Final   Special Requests Blood Culture adequate volume  Final   Culture   Final    NO GROWTH < 24 HOURS Performed at [facility], [address], [place], [place] [address]    Report Status PENDING  Incomplete  Blood Culture (routine x 2)     Status: None (Preliminary result)   Collection Time: 01/09/19 11:35 AM  Result Value Ref Range Status   Specimen Description   Final    LEFT ANTECUBITAL BOTTLES DRAWN AEROBIC AND ANAEROBIC   Special Requests Blood Culture adequate volume  Final   Culture   Final    NO GROWTH < 24 HOURS Performed at [facility], [address] [place],  [address]    Report Status PENDING  Incomplete  SARS Coronavirus 2 (CEPHEID - Performed in [place] hospital lab), Hosp Order     Status: None   Collection Time: 01/09/19  2:58 PM  Result Value Ref Range Status   SARS Coronavirus 2 NEGATIVE NEGATIVE Final    Comment: (NOTE) If result is NEGATIVE SARS-CoV-2 target nucleic acids are NOT DETECTED. The SARS-CoV-2 RNA is generally detectable in upper and lower  respiratory specimens during the acute phase of infection. The lowest  concentration of SARS-CoV-2 viral copies this assay can detect is 250  copies / mL. A negative result does not preclude SARS-CoV-2 infection  and should not be used as the sole basis for treatment or other  patient management decisions.  A negative result may occur with  improper specimen collection / handling, submission of specimen other  than nasopharyngeal swab, presence of viral mutation(s)  within the  areas targeted by this assay, and inadequate number of viral copies  (<250 copies / mL). A negative result must be combined with clinical  observations, patient history, and epidemiological information. If result is POSITIVE SARS-CoV-2 target nucleic acids are DETECTED. The SARS-CoV-2 RNA is generally detectable in upper and lower  respiratory specimens dur ing the acute phase of infection.  Positive  results are indicative of active infection with SARS-CoV-2.  Clinical  correlation with patient history and other diagnostic information is   necessary to determine patient infection status.  Positive results do  not rule out bacterial infection or co-infection with other viruses. If result is PRESUMPTIVE POSTIVE SARS-CoV-2 nucleic acids MAY BE PRESENT.   A presumptive positive result was obtained on the submitted specimen  and confirmed on repeat testing.  While 2019 novel coronavirus  (SARS-CoV-2) nucleic acids may be present in the submitted sample  additional confirmatory testing may be necessary for epidemiological  and / or clinical management purposes  to differentiate between  SARS-CoV-2 and other Sarbecovirus currently known to infect humans.  If clinically indicated additional testing with an alternate test  methodology [phone]) is advised. The SARS-CoV-2 RNA is generally  detectable in upper and lower respiratory sp ecimens during the acute  phase of infection. The expected result is Negative. Fact Sheet for Patients:  StrictlyIdeas.no Fact Sheet for Healthcare Providers: BankingDealers.co.za This test is not yet approved or cleared by the [place] FDA and has been authorized for detection and/or diagnosis of SARS-CoV-2 by FDA under an Emergency Use Authorization (EUA).  This EUA will remain in effect (meaning this test can be used) for the duration of the COVID-19 declaration under Section 564(b)(1) of the Act, 21 U.S.C. section 360bbb-3(b)(1), unless the authorization is terminated or revoked sooner. Performed at [facility], [address], [place], [place] [address]   MRSA PCR Screening     Status: Abnormal   Collection Time: 01/09/19  8:47 PM  Result Value Ref Range Status   MRSA by PCR POSITIVE (A) NEGATIVE Final    Comment:        The GeneXpert MRSA Assay (FDA approved for NASAL specimens only), is one component of a comprehensive MRSA colonization surveillance program. It is not intended to diagnose MRSA infection nor to guide or monitor  treatment for MRSA infections. RESULT CALLED TO, READ BACK BY AND VERIFIED WITH: TETREAULT,H @ 0007 ON 01/10/19 BY JUW Performed at [facility], [address] [place], [place] [address]   Culture, respiratory     Status: None (Preliminary result)   Collection Time: 01/09/19 11:39 PM  Result Value Ref Range Status   Specimen Description   Final    TRACHEAL ASPIRATE Performed at [facility], [address], [place], [place] [address]    Special Requests   Final    NONE Performed at [facility], [address], [place], [place] [address]    Gram Stain   Final    MODERATE WBC PRESENT, PREDOMINANTLY PMN RARE GRAM POSITIVE COCCI RARE YEAST Performed at [facility], [place] [address], [place], [place] [address]    Culture PENDING  Incomplete   Report Status PENDING  Incomplete   Studies/Results: Ct Head Wo Contrast  Result Date: 01/09/2019 CLINICAL DATA:  85 year old male with altered mental status. EXAM: CT HEAD WITHOUT CONTRAST TECHNIQUE: Contiguous axial images were obtained from the base of the skull through the vertex without intravenous contrast. COMPARISON:  02/28/2017 CT and prior studies FINDINGS: Brain:  No evidence of acute infarction, hemorrhage, hydrocephalus, extra-axial collection or mass lesion/mass effect. Atrophy and chronic small-vessel white matter ischemic changes again noted. Vascular: Atherosclerotic calcifications again noted Skull: Normal. Negative for fracture or focal lesion. Sinuses/Orbits: No acute finding. Other: None. IMPRESSION: 1. No evidence of acute intracranial abnormality. 2. Atrophy and chronic small-vessel white matter ischemic changes. Electronically Signed   By: Margarette Canada M.D.   On: 01/09/2019 15:20   Ct Chest W Contrast  Result Date: 01/09/2019 CLINICAL DATA:  Cough and pleural effusion EXAM: CT CHEST WITH CONTRAST TECHNIQUE: Multidetector CT imaging of the chest was performed during intravenous contrast administration. CONTRAST:  57m  OMNIPAQUE 300 COMPARISON:  Plain film from earlier in the same day. FINDINGS: Cardiovascular: Thoracic aorta and its branches demonstrate atherosclerotic calcifications without aneurysmal dilatation or dissection. Diffuse coronary calcifications are seen. The heart is at the upper limits of normal in size. Pulmonary artery is well visualized. No large central pulmonary embolus is seen. Timing was not performed for embolus evaluation. Mediastinum/Nodes: The esophagus as visualized is within normal limits. Thoracic inlet is within normal limits. Scattered small mediastinal lymph nodes are seen likely reactive in nature. Lungs/Pleura: Mild right lower lobe atelectatic changes are noted with small right pleural effusion. A larger left-sided pleural effusion is noted with associated lower lobe infiltrate with air bronchograms consistent with pneumonia. These changes are similar to that seen on recent plain film. No parenchymal nodules are seen. Upper Abdomen: Prior cholecystectomy is noted. No acute abnormality is noted within the abdomen. Musculoskeletal: Degenerative changes of the thoracic spine are noted. IMPRESSION: Bilateral pleural effusions left greater than right. Left lower lobe infiltrate consistent with acute pneumonia. Mild right basilar atelectasis is seen. Aortic Atherosclerosis (ICD10-I70.0). Electronically Signed   By: MInez CatalinaM.D.   On: 01/09/2019 15:17   UK[place]Venous Img Lower Bilateral  Result Date: 01/10/2019 CLINICAL DATA:  Bilateral lower extremity pain and edema. Evaluate for DVT. EXAM: BILATERAL LOWER EXTREMITY VENOUS DOPPLER ULTRASOUND TECHNIQUE: Gray-scale sonography with graded compression, as well as color Doppler and duplex ultrasound were performed to evaluate the lower extremity deep venous systems from the level of the common femoral vein and including the common femoral, femoral, profunda femoral, popliteal and calf veins including the posterior tibial, peroneal and gastrocnemius  veins when visible. The superficial great saphenous vein was also interrogated. Spectral Doppler was utilized to evaluate flow at rest and with distal augmentation maneuvers in the common femoral, femoral and popliteal veins. COMPARISON:  None. FINDINGS: RIGHT LOWER EXTREMITY The right common femoral vein, saphenofemoral junction and deep femoral vein were unable to be visualized secondary to central venous catheter bandages. Femoral Vein: No evidence of thrombus. Normal compressibility, respiratory phasicity and response to augmentation. Popliteal Vein: No evidence of thrombus. Normal compressibility, respiratory phasicity and response to augmentation. Calf Veins: No evidence of thrombus. Normal compressibility and flow on color Doppler imaging. Superficial Great Saphenous Vein: No evidence of thrombus. Normal compressibility. Venous Reflux:  None. Other Findings: Moderate amount of subcutaneous edema is noted at the level of the right thigh and lower leg. LEFT LOWER EXTREMITY Common Femoral Vein: No evidence of thrombus. Normal compressibility, respiratory phasicity and response to augmentation. Saphenofemoral Junction: No evidence of thrombus. Normal compressibility and flow on color Doppler imaging. Profunda Femoral Vein: No evidence of thrombus. Normal compressibility and flow on color Doppler imaging. Femoral Vein: No evidence of thrombus. Normal compressibility, respiratory phasicity and response to augmentation. Popliteal Vein: No evidence of thrombus. Normal compressibility, respiratory phasicity  and response to augmentation. Calf Veins: No evidence of thrombus. Normal compressibility and flow on color Doppler imaging. Superficial Great Saphenous Vein: No evidence of thrombus. Normal compressibility. Venous Reflux:  None. Other Findings: Moderate amount of subcutaneous edema is noted at the level of the left lower leg. IMPRESSION: Degraded examination without evidence of DVT within either lower extremity.  Electronically Signed   By: Sandi Mariscal M.D.   On: 01/10/2019 13:58   Dg Chest Port 1 View  Result Date: 01/10/2019 CLINICAL DATA:  Altered mental status. Pneumonia. EXAM: PORTABLE CHEST 1 VIEW COMPARISON:  01/10/2019. FINDINGS: Rotated radiograph. Endotracheal tube appears approximately 2 cm above carina. Enteric tube last side hole is at GE junction. The heart is enlarged. There is considerable opacity in the LEFT hemithorax, probably stable, likely atelectasis, consolidation, and effusion. Moderate vascular congestion appears worse throughout the visible RIGHT lung fields. IMPRESSION: Worsening aeration. Support tubes as described. Electronically Signed   By: Staci Righter M.D.   On: 01/10/2019 10:29   Dg Chest Port 1 View  Result Date: 01/10/2019 CLINICAL DATA:  85 year old male with enteric tube placement. EXAM: PORTABLE CHEST 1 VIEW COMPARISON:  Chest radiograph dated 01/09/2019 FINDINGS: Evaluation is limited due to patient's positioning and portable technique. An enteric tube is noted with side-port just distal to the region of the gastroesophageal junction and tip in the proximal stomach. An endotracheal tube appears above the carina. No significant change in the appearance of the lungs or heart since the earlier radiograph. IMPRESSION: Enteric tube with side-port just distal to the GE junction and tip in the proximal stomach. The tube can be further advanced by an additional 5-7 cm for optimal positioning. Electronically Signed   By: Anner Crete M.D.   On: 01/10/2019 02:49   Dg Chest Port 1 View  Result Date: 01/09/2019 CLINICAL DATA:  85 year old male status post enteric tube placement. EXAM: PORTABLE CHEST 1 VIEW COMPARISON:  Earlier radiograph dated 01/09/2019 FINDINGS: Endotracheal tube with tip above the carina in similar position. An enteric tube extends into the upper abdomen with tip likely in the proximal stomach. The side port of the enteric tube is in the region of the GE  junction. Recommend further advancing of the tube by approximately 5 cm. Left-sided pleural effusion and associated atelectasis or infiltrate similar or slightly progressed since the prior radiograph. No other interval change. IMPRESSION: Enteric tube with side-port in the region of the GE junction and tip in the proximal stomach. Recommend further advancing the tube by approximately 5 cm. Electronically Signed   By: Anner Crete M.D.   On: 01/09/2019 23:47   Dg Chest Portable 1 View  Result Date: 01/09/2019 CLINICAL DATA:  85 year old male intubated. Left lower lobe consolidation, bilateral pleural effusions. EXAM: PORTABLE CHEST 1 VIEW COMPARISON:  Chest CT and radiographs earlier today FINDINGS: Portable AP supine view at 1759 hours. Endotracheal tube tip in good position between the level the clavicles and carina. Enteric tube courses to the left abdomen, tip not included. The side hole might be visible at the level of the distal esophagus (arrow). Dense left lung base opacification. Stable cardiac size and mediastinal contours. No pneumothorax. Stable pulmonary vascularity. No confluent right lung opacity. IMPRESSION: 1. ET tube in good position. 2. Enteric tube courses to the left abdomen but the side hole might still be in the distal esophagus. Recommend advancing 5-6 more centimeters. 3. Left lower consolidation and effusion as seen by CT earlier today. Electronically Signed   By: Lemmie Evens  [place] Crane M.D.   On: 01/09/2019 18:22   Dg Chest Port 1 View  Result Date: 01/09/2019 CLINICAL DATA:  Altered mental status. EXAM: PORTABLE CHEST 1 VIEW COMPARISON:  Radiographs of December 01, 2018. FINDINGS: Stable cardiomegaly. Atherosclerosis of thoracic aorta is noted. No pneumothorax is noted. Bilateral interstitial densities are noted concerning for pulmonary edema. Mild left pleural effusion is noted with probable associated atelectasis or infiltrate. Bony thorax is unremarkable. IMPRESSION: Stable cardiomegaly with  bilateral interstitial densities concerning for edema. Mild left pleural effusion is noted with associated atelectasis or infiltrate. Aortic Atherosclerosis (ICD10-I70.0). Electronically Signed   By: Marijo Conception M.D.   On: 01/09/2019 11:50   US Abdomen Limited Ruq  Result Date: 01/10/2019 CLINICAL DATA:  Abnormal LFTs on admission. Previous cholecystectomy. EXAM: ULTRASOUND ABDOMEN LIMITED RIGHT UPPER QUADRANT COMPARISON:  None. FINDINGS: Gallbladder: Previous cholecystectomy. Common bile duct: Diameter: 8.1 mm. Liver: No focal lesion identified. Within normal limits in parenchymal echogenicity. Small amount of perihepatic fluid. Portal vein is patent on color Doppler imaging with normal direction of blood flow towards the liver. IMPRESSION: Small amount of perihepatic fluid, otherwise no acute findings. Previous cholecystectomy. Electronically Signed   By: Marin Olp M.D.   On: 01/10/2019 13:27    Medications:  Prior to Admission:  Medications Prior to Admission  Medication Sig Dispense Refill Last Dose  . acetaminophen (TYLENOL) 500 MG tablet Take 1,000 mg by mouth every 6 (six) hours as needed. Pain   Past Week at Unknown  . allopurinol (ZYLOPRIM) 300 MG tablet Take 300 mg by mouth daily.   01/08/2019 at Unknown time  . aspirin 81 MG chewable tablet Chew 81 mg by mouth daily.   01/08/2019 at 0930  . busPIRone (BUSPAR) 15 MG tablet Take 15 mg by mouth 2 (two) times daily.   01/08/2019 at Unknown time  . calcium gluconate 500 MG tablet Take 1 tablet by mouth daily.   01/08/2019 at Unknown time  . citalopram (CELEXA) 20 MG tablet Take 20 mg by mouth daily.   01/08/2019 at Unknown time  . folic acid (FOLVITE) 1 MG tablet Take 1 mg by mouth daily.   01/08/2019 at Unknown time  . gabapentin (NEURONTIN) 300 MG capsule Take 300 mg by mouth 3 (three) times daily.   01/08/2019 at Unknown time  . Melatonin 5 MG TABS Take 1 tablet by mouth at bedtime.   01/08/2019 at Unknown time  . midodrine (PROAMATINE) 2.5 MG  tablet Take 2.5 mg by mouth 2 (two) times a day.   01/08/2019 at Unknown time  . Omega-3 Fatty Acids (FISH OIL) 1000 MG CAPS Take 1 capsule by mouth 2 (two) times a day.   01/08/2019 at Unknown time  . omeprazole (PRILOSEC) 20 MG capsule Take 20 mg by mouth daily.   01/08/2019 at Unknown time  . Tamsulosin HCl (FLOMAX) 0.4 MG CAPS Take 0.4 mg by mouth daily.    01/08/2019 at Unknown time  . traMADol (ULTRAM) 50 MG tablet Take 50 mg by mouth 3 (three) times daily.   01/08/2019 at Unknown time  . vitamin B-12 (CYANOCOBALAMIN) 1000 MCG tablet Take 1,000 mcg by mouth daily.   01/08/2019 at Unknown time  . ciprofloxacin (CIPRO) 500 MG tablet Take 500 mg by mouth 2 (two) times daily.   Completed Course at Unknown time   Scheduled: . albuterol  2.5 mg Nebulization Q4H  . chlorhexidine gluconate (MEDLINE KIT)  15 mL Mouth Rinse BID  . Chlorhexidine Gluconate Cloth  6 each  Topical Q0600  . Chlorhexidine Gluconate Cloth  6 each Topical Daily  . collagenase   Topical Daily  . enoxaparin (LOVENOX) injection  40 mg Subcutaneous Q24H  . hydrocortisone sod succinate (SOLU-CORTEF) inj  50 mg Intravenous Q8H  . mouth rinse  15 mL Mouth Rinse 10 times per day  . mupirocin ointment  1 application Nasal BID  . sodium chloride flush  10-40 mL Intracatheter Q12H   Continuous: . ceFEPime (MAXIPIME) IV 2 g (01/11/19 0446)  . dexmedetomidine (PRECEDEX) IV infusion 0.4 mcg/kg/hr (01/11/19 0516)  . dextrose 5% lactated ringers with KCl 20 mEq/L 75 mL/hr at 01/11/19 0444  . famotidine (PEPCID) IV 20 mg (01/10/19 2128)  . norepinephrine (LEVOPHED) Adult infusion Stopped (01/10/19 1633)  . vancomycin Stopped (01/10/19 1407)   [id number], fentaNYL (SUBLIMAZE) injection, fentaNYL (SUBLIMAZE) injection, ipratropium-albuterol, midazolam, midazolam, ondansetron (ZOFRAN) IV, sodium chloride flush  Assesment: He was admitted with acute metabolic encephalopathy and was found to have healthcare associated pneumonia sepsis and  acute hypoxic respiratory failure.  He also had heart failure.  He was intubated and placed on mechanical ventilation and has remained on mechanical ventilator.  He became more agitated last night and he has been placed on Precedex and at least right now he is calm.  He has some element of COPD at baseline with a long smoking history and that is being treated  Sepsis pathophysiology has largely resolved.  He required pressors briefly.  He now has central line.  Blood pressure this morning is in the 140s.  He still has elevated liver function testing  He remains thrombocytopenic with platelets of 65,000.  His white blood count is low at 3600 and his hemoglobin is somewhat low at 9.9 he has listed nucleated red blood cell on his CBC  Active Problems:   HCAP (healthcare-associated pneumonia)   Sepsis with acute hypoxic respiratory failure (HCC)   Hypotension   Abnormal liver function   Pressure injury of skin   Sepsis due to undetermined organism ([place])   Acute respiratory failure with hypoxia (HCC)   Thrombocytopenia (HCC)   Acute CHF (congestive heart failure) (HCC)   Altered mental status   Acute metabolic encephalopathy   Goals of care, counseling/discussion   Palliative care encounter    Plan: Continue treatments.  Likely too agitated to try any form of weaning now that he is on Precedex.  Continue other treatments.    LOS: 2 days   Alonza Bogus 01/11/2019, 7:44 AM

## 2019-01-11 NOTE — Progress Notes (Signed)
Per MD Tat continue to measure CVP and keep femoral line.

## 2019-01-11 NOTE — Progress Notes (Signed)
Initial Nutrition Assessment  RD working remotely.   DOCUMENTATION CODES:    INTERVENTION:  If patient unable to wean next 24 hr-recommend:  Initiate continuous Vital High Protein @ 40 ml/hr via OGT and advance 10 ml q 6 hrs to goal rate of 60 ml/hr   Tube feeding regimen provides 1440 kcal, 125 grams of protein, and 1204 ml of H2O.     -1 packet Juven BID per tube, each packet provides 90 calories, 2.5 grams of protein, 8 grams of carbohydrate, and 14 grams of amino acids; supplement contains CaHMB, glutamine, and arginine, to promote wound healing   NUTRITION DIAGNOSIS:   Inadequate oral intake related to (S) inability to eat, acute illness(respiratory failure-bilateral PE) as evidenced by NPO status.  GOAL:   Provide needs based on ASPEN/SCCM guidelines MONITOR:   Vent status, Weight trends, Labs, I & O's, Skin   REASON FOR ASSESSMENT:   Ventilator    ASSESSMENT: Patient is an 85 yo male presents on 5/10 with infection.  Stage 3 PI to right heel. History of recurrent UTI's and altered mental status.   -Central line placed on 5/11. -CT-Bilateral pleural effusions   Patient is currently intubated on ventilator support-5/10 due to acute respiratory failure. ETT secured at 24 cm. Palliative talked with family yesterday and current plan is for aggressive care approach. Weight gain from 74.6 kg to 80 kg since admission. Needs calculated based on 74.6 kg.  Intake/Output Summary (Last 24 hours) at 01/11/2019 1127 Last data filed at 01/11/2019 1124 Gross per 24 hour  Intake 3790.02 ml  Output 2378 ml  Net 1412.02 ml    MV: 8  L/min Temp (24hrs), Avg:99.1 F (37.3 C), Min:98.4 F (36.9 C), Max:99.7 F (37.6 C) Sedation :Elayne Snare [place] -Precedex   Medications reviewed and include:   Labs: hypokalemia  BMP Latest Ref Rng & Units 01/11/2019 01/10/2019 01/09/2019  Glucose 70 - 99 mg/dL 156(H) 69(L) 125(H)  BUN 8 - 23 mg/dL 20 26(H) 25(H)  Creatinine 0.61 - 1.24 mg/dL  0.72 0.88 0.54(L)  Sodium 135 - 145 mmol/L 140 138 136  Potassium 3.5 - 5.1 mmol/L 3.3(L) 3.8 4.3  Chloride 98 - 111 mmol/L 108 103 97(L)  CO2 22 - 32 mmol/L 23 26 29   Calcium 8.9 - 10.3 mg/dL 8.5(L) 8.4(L) 8.9    NUTRITION - FOCUSED PHYSICAL EXAM:  Unable to complete Nutrition-Focused physical exam at this time.    Diet Order:   Diet Order            Diet NPO time specified  Diet effective now              EDUCATION NEEDS:   Not appropriate for education at this time   Skin:  Skin Assessment: Skin Integrity Issues: Skin Integrity Issues:: Stage III Stage III: right heel and open area l/r buttocks  Last BM:  5/11  Height:   Ht Readings from Last 1 Encounters:  01/10/19 5\' 8"  (1.727 m)    Weight:   Wt Readings from Last 1 Encounters:  01/11/19 80.1 kg    Ideal Body Weight:  70 kg  BMI:  Body mass index is 26.85 kg/m.  Estimated Nutritional Needs:   Kcal:  [id number] ([id number])  Protein:  113-135 (1.5-1.8 gr/kg/adm wt)  Fluid:  per MD  Colman Cater MS,RD,CSG,LDN Office: [phone] Pager: [phone]

## 2019-01-11 NOTE — Progress Notes (Signed)
Per lab hold all antibiotics until vanc trough has been drawn.

## 2019-01-12 ENCOUNTER — Inpatient Hospital Stay (HOSPITAL_COMMUNITY): Payer: Medicare Other

## 2019-01-12 ENCOUNTER — Inpatient Hospital Stay: Payer: Self-pay

## 2019-01-12 LAB — COMPREHENSIVE METABOLIC PANEL
ALT: 49 U/L — ABNORMAL HIGH (ref 0–44)
ALT: 44 U/L (ref 0–44)
AST: 56 U/L — ABNORMAL HIGH (ref 15–41)
AST: 44 U/L — ABNORMAL HIGH (ref 15–41)
Albumin: 2.7 g/dL — ABNORMAL LOW (ref 3.5–5.0)
Albumin: 2.6 g/dL — ABNORMAL LOW (ref 3.5–5.0)
Alkaline Phosphatase: 92 U/L (ref 38–126)
Alkaline Phosphatase: 90 U/L (ref 38–126)
Anion gap: 10 (ref 5–15)
Anion gap: 8 (ref 5–15)
BUN: 22 mg/dL (ref 8–23)
BUN: 19 mg/dL (ref 8–23)
CO2: 25 mmol/L (ref 22–32)
CO2: 27 mmol/L (ref 22–32)
Calcium: 9 mg/dL (ref 8.9–10.3)
Calcium: 8.5 mg/dL — ABNORMAL LOW (ref 8.9–10.3)
Chloride: 105 mmol/L (ref 98–111)
Chloride: 101 mmol/L (ref 98–111)
Creatinine, Ser: 0.59 mg/dL — ABNORMAL LOW (ref 0.61–1.24)
Creatinine, Ser: 0.52 mg/dL — ABNORMAL LOW (ref 0.61–1.24)
GFR calc Af Amer: >60 mL/min (ref >60)
GFR calc Af Amer: >60 mL/min (ref >60)
GFR calc non Af Amer: >60 mL/min (ref >60)
GFR calc non Af Amer: >60 mL/min (ref >60)
Glucose, Bld: 186 mg/dL — ABNORMAL HIGH (ref 70–99)
Glucose, Bld: 129 mg/dL — ABNORMAL HIGH (ref 70–99)
Potassium: 3 mmol/L — ABNORMAL LOW (ref 3.5–5.1)
Potassium: 2.8 mmol/L — ABNORMAL LOW (ref 3.5–5.1)
Sodium: 140 mmol/L (ref 135–145)
Sodium: 136 mmol/L (ref 135–145)
Total Bilirubin: 0.7 mg/dL (ref 0.3–1.2)
Total Bilirubin: 1 mg/dL (ref 0.3–1.2)
Total Protein: 5.9 g/dL — ABNORMAL LOW (ref 6.5–8.1)
Total Protein: 5.7 g/dL — ABNORMAL LOW (ref 6.5–8.1)

## 2019-01-12 LAB — BLOOD GAS, ARTERIAL
Acid-Base Excess: 4 mmol/L — ABNORMAL HIGH (ref 0.0–2.0)
Bicarbonate: 28.2 mmol/L — ABNORMAL HIGH (ref 20.0–28.0)
Drawn by: 22223
FIO2: 40
MECHVT: 540 mL
O2 Saturation: 97.2 %
PEEP: 5 cmH2O
Patient temperature: 37
RATE: 16 resp/min
pCO2 arterial: 35.8 mmHg (ref 32.0–48.0)
pH, Arterial: 7.494 — ABNORMAL HIGH (ref 7.350–7.450)
pO2, Arterial: 87.6 mmHg (ref 83.0–108.0)

## 2019-01-12 LAB — CBC
HCT: 32.7 % — ABNORMAL LOW (ref 39.0–52.0)
Hemoglobin: 11 g/dL — ABNORMAL LOW (ref 13.0–17.0)
MCH: 31.7 pg (ref 26.0–34.0)
MCHC: 33.6 g/dL (ref 30.0–36.0)
MCV: 94.2 fL (ref 80.0–100.0)
Platelets: 63 10*3/uL — ABNORMAL LOW (ref 150–400)
RBC: 3.47 MIL/uL — ABNORMAL LOW (ref 4.22–5.81)
RDW: 17.7 % — ABNORMAL HIGH (ref 11.5–15.5)
WBC: 10.1 10*3/uL (ref 4.0–10.5)
nRBC: 0.4 % — ABNORMAL HIGH (ref 0.0–0.2)

## 2019-01-12 LAB — PROCALCITONIN: Procalcitonin: <0.1 ng/mL

## 2019-01-12 LAB — LEGIONELLA PNEUMOPHILA SEROGP 1 UR AG: L. pneumophila Serogp 1 Ur Ag: NEGATIVE

## 2019-01-12 LAB — CULTURE, RESPIRATORY W GRAM STAIN

## 2019-01-12 LAB — MAGNESIUM: Magnesium: 1.6 mg/dL — ABNORMAL LOW (ref 1.7–2.4)

## 2019-01-12 LAB — CULTURE, RESPIRATORY

## 2019-01-12 LAB — GLUCOSE, CAPILLARY
Glucose-Capillary: 169 mg/dL — ABNORMAL HIGH (ref 70–99)
Glucose-Capillary: 170 mg/dL — ABNORMAL HIGH (ref 70–99)
Glucose-Capillary: 147 mg/dL — ABNORMAL HIGH (ref 70–99)
Glucose-Capillary: 137 mg/dL — ABNORMAL HIGH (ref 70–99)
Glucose-Capillary: 147 mg/dL — ABNORMAL HIGH (ref 70–99)

## 2019-01-12 LAB — VANCOMYCIN, TROUGH: Vancomycin Tr: 9 ug/mL — ABNORMAL LOW (ref 15–20)

## 2019-01-12 LAB — PHOSPHORUS: Phosphorus: 1.7 mg/dL — ABNORMAL LOW (ref 2.5–4.6)

## 2019-01-12 MED ORDER — VANCOMYCIN HCL 10 G IV SOLR
1750.0000 mg | INTRAVENOUS | Status: DC
  Administered 2019-01-13 – 2019-01-17 (×5): 1750 mg via INTRAVENOUS
  Filled 2019-01-12 (×6): qty 1750

## 2019-01-12 MED ORDER — FUROSEMIDE 10 MG/ML IJ SOLN
40.0000 mg | Freq: Once | INTRAMUSCULAR | Status: AC
  Administered 2019-01-12: 40 mg via INTRAVENOUS
  Filled 2019-01-12: qty 4

## 2019-01-12 MED ORDER — FREE WATER: 200.0000 mL | Freq: Four times a day (QID) | Status: DC

## 2019-01-12 MED ORDER — POTASSIUM CHLORIDE 10 MEQ/100ML IV SOLN
10.0000 meq | INTRAVENOUS | Status: AC
  Administered 2019-01-12 (×4): 10 meq via INTRAVENOUS
  Filled 2019-01-12 (×4): qty 100

## 2019-01-12 MED ORDER — PRO-STAT SUGAR FREE PO LIQD: 30.0000 mL | Freq: Two times a day (BID) | ORAL | Status: DC

## 2019-01-12 MED ORDER — SODIUM PHOSPHATES 45 MMOLE/15ML IV SOLN
30.0000 mmol | Freq: Once | INTRAVENOUS | Status: AC
  Administered 2019-01-12: 30 mmol via INTRAVENOUS
  Filled 2019-01-12: qty 10

## 2019-01-12 MED ORDER — VITAL HIGH PROTEIN PO LIQD
1000.0000 mL | ORAL | Status: DC
  Filled 2019-01-12 (×2): qty 1000

## 2019-01-12 MED ORDER — FREE WATER: 250.0000 mL | Freq: Four times a day (QID) | Status: DC

## 2019-01-12 MED ORDER — MAGNESIUM SULFATE 2 GM/50ML IV SOLN
2.0000 g | Freq: Once | INTRAVENOUS | Status: AC
  Administered 2019-01-12: 2 g via INTRAVENOUS
  Filled 2019-01-12: qty 50

## 2019-01-12 NOTE — Progress Notes (Signed)
Again pt noted to have tube feeding draining from oral cavity. Mouth suctioned, no noted tube feed from ETT in line suction at this time. Verified external tube measurement still at 36cm. Tube feed rate slowed back down to 74mL/hr. MD Rizwan notified. Dietary also notified and need for flush in tube feeding.

## 2019-01-12 NOTE — Progress Notes (Signed)
Subjective: He had trouble with his feeding tube last night.  It is not totally clear if he is not tolerating the increased rate of tube feeding or if it was because of the tube placement.  It appeared that the tube had been put back into position but this morning he had tube feeding coming out of his mouth.  No tube feeding coming out of the endotracheal tube.  Chest x-ray is pending.  His infiltrate actually looked a little bit better on most recent chest x-ray which I have personally reviewed.  He remains on the ventilator.  He is on Precedex.  He had been very agitated earlier.  Objective: Vital signs in last 24 hours: Temp:  [97.5 F (36.4 C)-98.6 F (37 C)] 97.6 F (36.4 C) (05/13 0736) Pulse Rate:  [74-108] 94 (05/13 0736) Resp:  [14-23] 17 (05/13 0736) BP: (112-165)/(76-100) 157/89 (05/13 0730) SpO2:  [90 %-99 %] 95 % (05/13 0748) FiO2 (%):  [40 %] 40 % (05/13 0748) Weight:  [79.5 kg] 79.5 kg (05/12 1125) Weight change: -0.6 kg Last BM Date: 01/10/19  Intake/Output from previous day: 05/12 0701 - 05/13 0700 In: 2493.4 [I.V.:1070.9; NG/GT:416.3; IV Piggyback:1006.2] Out: 1903 [Urine:1875; Emesis/NG output:3]  PHYSICAL EXAM General appearance: Intubated sedated on mechanical ventilation Resp: rhonchi bilaterally Cardio: regular rate and rhythm, S1, S2 normal, no murmur, click, rub or gallop GI: soft, non-tender; bowel sounds normal; no masses,  no organomegaly Extremities: extremities normal, atraumatic, no cyanosis or edema  Lab Results:  Results for orders placed or performed during the hospital encounter of 01/09/19 (from the past 48 hour(s))  Folate     Status: None   Collection Time: 01/10/19  9:35 AM  Result Value Ref Range   Folate 18.8 >5.9 ng/mL    Comment: Performed at [facility], [address] [place], [place] [address]  Fibrinogen     Status: None   Collection Time: 01/10/19  9:35 AM  Result Value Ref Range   Fibrinogen 435 210 - 475 mg/dL    Comment:  Performed at [facility], [address] [place], [place] [address]  Protime-INR     Status: None   Collection Time: 01/10/19  9:35 AM  Result Value Ref Range   Prothrombin Time 14.2 11.4 - 15.2 seconds   INR 1.1 0.8 - 1.2    Comment: (NOTE) INR goal varies based on device and disease states. Performed at [facility], [address] [place], [place] [address]   APTT     Status: Abnormal   Collection Time: 01/10/19  9:35 AM  Result Value Ref Range   aPTT 46 (H) 24 - 36 seconds    Comment:        IF BASELINE aPTT IS ELEVATED, SUGGEST PATIENT RISK ASSESSMENT BE USED TO DETERMINE APPROPRIATE ANTICOAGULANT THERAPY. Performed at [facility], [address] [place], [place] [address]   Troponin I - Once     Status: Abnormal   Collection Time: 01/10/19  9:35 AM  Result Value Ref Range   Troponin I 0.78 (HH) <0.03 ng/mL    Comment: CRITICAL RESULT CALLED TO, READ BACK BY AND VERIFIED WITH: MURPHY,E AT 10:20AM ON 01/10/19 BY [facility] Performed at [facility], [address] [place], [place] [address]   Occult blood card to lab, stool RN will collect     Status: None   Collection Time: 01/10/19 10:00 AM  Result Value Ref Range   Fecal Occult Bld NEGATIVE NEGATIVE    Comment: Performed at  [facility], [address], [place], [place] [address]  Glucose, capillary     Status: None   Collection Time: 01/10/19 11:41 AM  Result Value Ref Range   Glucose-Capillary 85 70 - 99 mg/dL  ABO/Rh     Status: None   Collection Time: 01/10/19  3:16 PM  Result Value Ref Range   ABO/RH(D)      AB POS Performed at [facility], [address], [place], [place] [address]   Troponin I - Now Then Q6H     Status: Abnormal   Collection Time: 01/10/19  3:16 PM  Result Value Ref Range   Troponin I 0.89 (HH) <0.03 ng/mL    Comment: CRITICAL VALUE NOTED.  VALUE IS CONSISTENT WITH PREVIOUSLY REPORTED AND CALLED VALUE. Performed at [facility], [address], [place], [place] [address]   Glucose,  capillary     Status: None   Collection Time: 01/10/19  4:30 PM  Result Value Ref Range   Glucose-Capillary 97 70 - 99 mg/dL  Glucose, capillary     Status: Abnormal   Collection Time: 01/10/19  7:39 PM  Result Value Ref Range   Glucose-Capillary 108 (H) 70 - 99 mg/dL  Troponin I - Now Then Q6H     Status: Abnormal   Collection Time: 01/10/19  8:03 PM  Result Value Ref Range   Troponin I 0.93 (HH) <0.03 ng/mL    Comment: CRITICAL VALUE NOTED.  VALUE IS CONSISTENT WITH PREVIOUSLY REPORTED AND CALLED VALUE. Performed at [facility], [address] [place], [place] [address]   Hemoglobin and hematocrit, blood     Status: Abnormal   Collection Time: 01/10/19  8:03 PM  Result Value Ref Range   Hemoglobin 9.5 (L) 13.0 - 17.0 g/dL    Comment: POST TRANSFUSION SPECIMEN   HCT 29.1 (L) 39.0 - 52.0 %    Comment: Performed at [facility], [address] [place],  [address]  Glucose, capillary     Status: Abnormal   Collection Time: 01/11/19 12:19 AM  Result Value Ref Range   Glucose-Capillary 134 (H) 70 - 99 mg/dL  Procalcitonin     Status: None   Collection Time: 01/11/19  4:19 AM  Result Value Ref Range   Procalcitonin <0.10 ng/mL    Comment:        Interpretation: PCT (Procalcitonin) <= 0.5 ng/mL: Systemic infection (sepsis) is not likely. Local bacterial infection is possible. (NOTE)       Sepsis PCT Algorithm           Lower Respiratory Tract                                      Infection PCT Algorithm    ----------------------------     ----------------------------         PCT < 0.25 ng/mL                PCT < 0.10 ng/mL         Strongly encourage             Strongly discourage   discontinuation of antibiotics    initiation of antibiotics    ----------------------------     -----------------------------       PCT 0.25 - 0.50 ng/mL            PCT 0.10 - 0.25 ng/mL  OR       >80% decrease in PCT            Discourage initiation of                                             antibiotics      Encourage discontinuation           of antibiotics    ----------------------------     -----------------------------         PCT >= 0.50 ng/mL              PCT 0.26 - 0.50 ng/mL               AND        <80% decrease in PCT             Encourage initiation of                                             antibiotics       Encourage continuation           of antibiotics    ----------------------------     -----------------------------        PCT >= 0.50 ng/mL                  PCT > 0.50 ng/mL               AND         increase in PCT                  Strongly encourage                                      initiation of antibiotics    Strongly encourage escalation           of antibiotics                                     -----------------------------                                           PCT <= 0.25 ng/mL                                                 OR                                        > 80% decrease in PCT                                     Discontinue / Do not initiate  antibiotics Performed at [facility], [address] [place], [place] [address]   Comprehensive metabolic panel     Status: Abnormal   Collection Time: 01/11/19  4:19 AM  Result Value Ref Range   Sodium 140 135 - 145 mmol/L   Potassium 3.3 (L) 3.5 - 5.1 mmol/L   Chloride 108 98 - 111 mmol/L   CO2 23 22 - 32 mmol/L   Glucose, Bld 156 (H) 70 - 99 mg/dL   BUN 20 8 - 23 mg/dL   Creatinine, Ser 0.72 0.61 - 1.24 mg/dL   Calcium 8.5 (L) 8.9 - 10.3 mg/dL   Total Protein 5.9 (L) 6.5 - 8.1 g/dL   Albumin 2.9 (L) 3.5 - 5.0 g/dL   AST 87 (H) 15 - 41 U/L   ALT 53 (H) 0 - 44 U/L   Alkaline Phosphatase 99 38 - 126 U/L   Total Bilirubin 1.0 0.3 - 1.2 mg/dL   GFR calc non Af Amer >60 >60 mL/min   GFR calc Af Amer >60 >60 mL/min   Anion gap 9 5 - 15    Comment: Performed at [facility], [address], [place], [place] [address]   CBC     Status: Abnormal   Collection Time: 01/11/19  4:19 AM  Result Value Ref Range   WBC 3.6 (L) 4.0 - 10.5 K/uL   RBC 3.15 (L) 4.22 - 5.81 MIL/uL   Hemoglobin 9.9 (L) 13.0 - 17.0 g/dL   HCT 29.9 (L) 39.0 - 52.0 %   MCV 94.9 80.0 - 100.0 fL   MCH 31.4 26.0 - 34.0 pg   MCHC 33.1 30.0 - 36.0 g/dL   RDW 18.2 (H) 11.5 - 15.5 %   Platelets 65 (L) 150 - 400 K/uL    Comment: SPECIMEN CHECKED FOR CLOTS Immature Platelet Fraction may be clinically indicated, consider ordering this additional test [id number]    nRBC 1.1 (H) 0.0 - 0.2 %    Comment: Performed at [facility], [address], [place], [place] [address]  Glucose, capillary     Status: Abnormal   Collection Time: 01/11/19  4:48 AM  Result Value Ref Range   Glucose-Capillary 153 (H) 70 - 99 mg/dL  Blood gas, arterial     Status: None   Collection Time: 01/11/19  5:00 AM  Result Value Ref Range   FIO2 40.00    pH, Arterial 7.409 7.350 - 7.450   pCO2 arterial 41.2 32.0 - 48.0 mmHg   pO2, Arterial 99.7 83.0 - 108.0 mmHg   Bicarbonate 25.6 20.0 - 28.0 mmol/L   Acid-Base Excess 1.4 0.0 - 2.0 mmol/L   O2 Saturation 97.5 %   Patient temperature 37.0    Allens test (pass/fail) PASS PASS    Comment: Performed at [facility], [address] [place], [place] [address]  Glucose, capillary     Status: Abnormal   Collection Time: 01/11/19  8:00 AM  Result Value Ref Range   Glucose-Capillary 165 (H) 70 - 99 mg/dL  Glucose, capillary     Status: Abnormal   Collection Time: 01/11/19 11:23 AM  Result Value Ref Range   Glucose-Capillary 186 (H) 70 - 99 mg/dL  Glucose, capillary     Status: Abnormal   Collection Time: 01/11/19  3:53 PM  Result Value Ref Range   Glucose-Capillary 186 (H) 70 - 99 mg/dL   Comment 1 Notify RN    Comment 2 Document in Chart   Glucose, capillary     Status: Abnormal  Collection Time: 01/11/19  7:41 PM  Result Value Ref Range   Glucose-Capillary 166 (H) 70 - 99 mg/dL  Glucose, capillary     Status:  Abnormal   Collection Time: 01/11/19 11:48 PM  Result Value Ref Range   Glucose-Capillary 191 (H) 70 - 99 mg/dL  Glucose, capillary     Status: Abnormal   Collection Time: 01/12/19  4:28 AM  Result Value Ref Range   Glucose-Capillary 169 (H) 70 - 99 mg/dL  Blood gas, arterial     Status: Abnormal   Collection Time: 01/12/19  5:00 AM  Result Value Ref Range   FIO2 40.00    Delivery systems VENTILATOR    Mode PRESSURE REGULATED VOLUME CONTROL    VT 540 mL   LHR 16 resp/min   Peep/cpap 5.0 cm H20   pH, Arterial 7.494 (H) 7.350 - 7.450   pCO2 arterial 35.8 32.0 - 48.0 mmHg   pO2, Arterial 87.6 83.0 - 108.0 mmHg   Bicarbonate 28.2 (H) 20.0 - 28.0 mmol/L   Acid-Base Excess 4.0 (H) 0.0 - 2.0 mmol/L   O2 Saturation 97.2 %   Patient temperature 37.0    Collection site LEFT RADIAL    Drawn by [id number]    Allens test (pass/fail) PASS PASS    Comment: Performed at [facility], [address] [place], [place] [address]  Procalcitonin     Status: None   Collection Time: 01/12/19  5:25 AM  Result Value Ref Range   Procalcitonin <0.10 ng/mL    Comment:        Interpretation: PCT (Procalcitonin) <= 0.5 ng/mL: Systemic infection (sepsis) is not likely. Local bacterial infection is possible. (NOTE)       Sepsis PCT Algorithm           Lower Respiratory Tract                                      Infection PCT Algorithm    ----------------------------     ----------------------------         PCT < 0.25 ng/mL                PCT < 0.10 ng/mL         Strongly encourage             Strongly discourage   discontinuation of antibiotics    initiation of antibiotics    ----------------------------     -----------------------------       PCT 0.25 - 0.50 ng/mL            PCT 0.10 - 0.25 ng/mL               OR       >80% decrease in PCT            Discourage initiation of                                            antibiotics      Encourage discontinuation           of antibiotics     ----------------------------     -----------------------------         PCT >= 0.50 ng/mL              PCT 0.26 -  0.50 ng/mL               AND        <80% decrease in PCT             Encourage initiation of                                             antibiotics       Encourage continuation           of antibiotics    ----------------------------     -----------------------------        PCT >= 0.50 ng/mL                  PCT > 0.50 ng/mL               AND         increase in PCT                  Strongly encourage                                      initiation of antibiotics    Strongly encourage escalation           of antibiotics                                     -----------------------------                                           PCT <= 0.25 ng/mL                                                 OR                                        > 80% decrease in PCT                                     Discontinue / Do not initiate                                             antibiotics Performed at [facility], [address] [place],  [address]   Comprehensive metabolic panel     Status: Abnormal   Collection Time: 01/12/19  5:25 AM  Result Value Ref Range   Sodium 140 135 - 145 mmol/L   Potassium 3.0 (L) 3.5 - 5.1 mmol/L   Chloride 105 98 - 111 mmol/L   CO2 25 22 - 32 mmol/L   Glucose, Bld 186 (H) 70 - 99 mg/dL   BUN 22 8 - 23 mg/dL  Creatinine, Ser 0.59 (L) 0.61 - 1.24 mg/dL   Calcium 9.0 8.9 - 10.3 mg/dL   Total Protein 5.9 (L) 6.5 - 8.1 g/dL   Albumin 2.7 (L) 3.5 - 5.0 g/dL   AST 56 (H) 15 - 41 U/L   ALT 49 (H) 0 - 44 U/L   Alkaline Phosphatase 92 38 - 126 U/L   Total Bilirubin 0.7 0.3 - 1.2 mg/dL   GFR calc non Af Amer >60 >60 mL/min   GFR calc Af Amer >60 >60 mL/min   Anion gap 10 5 - 15    Comment: Performed at [facility], [address], [place], [place] [address]  CBC     Status: Abnormal   Collection Time: 01/12/19  5:25 AM  Result Value Ref Range    WBC 10.1 4.0 - 10.5 K/uL   RBC 3.47 (L) 4.22 - 5.81 MIL/uL   Hemoglobin 11.0 (L) 13.0 - 17.0 g/dL   HCT 32.7 (L) 39.0 - 52.0 %   MCV 94.2 80.0 - 100.0 fL   MCH 31.7 26.0 - 34.0 pg   MCHC 33.6 30.0 - 36.0 g/dL   RDW 17.7 (H) 11.5 - 15.5 %   Platelets 63 (L) 150 - 400 K/uL    Comment: Immature Platelet Fraction may be clinically indicated, consider ordering this additional test [id number]    nRBC 0.4 (H) 0.0 - 0.2 %    Comment: Performed at [facility], [address], [place], [place] [address]  Magnesium     Status: Abnormal   Collection Time: 01/12/19  5:25 AM  Result Value Ref Range   Magnesium 1.6 (L) 1.7 - 2.4 mg/dL    Comment: Performed at [facility], [address] [place], [place] [address]  Phosphorus     Status: Abnormal   Collection Time: 01/12/19  5:25 AM  Result Value Ref Range   Phosphorus 1.7 (L) 2.5 - 4.6 mg/dL    Comment: Performed at [facility], [address], [place], [place] [address]  Glucose, capillary     Status: Abnormal   Collection Time: 01/12/19  7:34 AM  Result Value Ref Range   Glucose-Capillary 170 (H) 70 - 99 mg/dL    ABGS Recent Labs    01/12/19 0500  PHART 7.494*  PO2ART 87.6  HCO3 28.2*   CULTURES Recent Results (from the past 240 hour(s))  Blood Culture (routine x 2)     Status: None (Preliminary result)   Collection Time: 01/09/19 11:13 AM  Result Value Ref Range Status   Specimen Description BLOOD LEFT ARM BOTTLES DRAWN AEROBIC AND ANAEROBIC  Final   Special Requests Blood Culture adequate volume  Final   Culture   Final    NO GROWTH 2 DAYS Performed at [facility], [address], [place], [place] [address]    Report Status PENDING  Incomplete  Blood Culture (routine x 2)     Status: None (Preliminary result)   Collection Time: 01/09/19 11:35 AM  Result Value Ref Range Status   Specimen Description   Final    LEFT ANTECUBITAL BOTTLES DRAWN AEROBIC AND ANAEROBIC   Special Requests Blood Culture adequate volume  Final    Culture   Final    NO GROWTH 2 DAYS Performed at [facility], [address] [place], [place] [address]    Report Status PENDING  Incomplete  SARS Coronavirus 2 (CEPHEID - Performed in [place] hospital lab), Hosp Order     Status: None   Collection Time: 01/09/19  2:58 PM  Result Value Ref Range Status   SARS Coronavirus 2 NEGATIVE NEGATIVE Final    Comment: (NOTE) If result is NEGATIVE SARS-CoV-2 target nucleic acids are NOT DETECTED. The SARS-CoV-2 RNA is generally detectable in upper and lower  respiratory specimens during the acute phase of infection. The lowest  concentration of SARS-CoV-2 viral copies this assay can detect is 250  copies / mL. A negative result does not preclude SARS-CoV-2 infection  and should not be used as the sole basis for treatment or other  patient management decisions.  A negative result may occur with  improper specimen collection / handling, submission of specimen other  than nasopharyngeal swab, presence of viral mutation(s) within the  areas targeted by this assay, and inadequate number of viral copies  (<250 copies / mL). A negative result must be combined with clinical  observations, patient history, and epidemiological information. If result is POSITIVE SARS-CoV-2 target nucleic acids are DETECTED. The SARS-CoV-2 RNA is generally detectable in upper and lower  respiratory specimens dur ing the acute phase of infection.  Positive  results are indicative of active infection with SARS-CoV-2.  Clinical  correlation with patient history and other diagnostic information is  necessary to determine patient infection status.  Positive results do  not rule out bacterial infection or co-infection with other viruses. If result is PRESUMPTIVE POSTIVE SARS-CoV-2 nucleic acids MAY BE PRESENT.   A presumptive positive result was obtained on the submitted specimen  and confirmed on repeat testing.  While 2019 novel coronavirus  (SARS-CoV-2) nucleic acids  may be present in the submitted sample  additional confirmatory testing may be necessary for epidemiological  and / or clinical management purposes  to differentiate between  SARS-CoV-2 and other Sarbecovirus currently known to infect humans.  If clinically indicated additional testing with an alternate test  methodology [phone]) is advised. The SARS-CoV-2 RNA is generally  detectable in upper and lower respiratory sp ecimens during the acute  phase of infection. The expected result is Negative. Fact Sheet for Patients:  StrictlyIdeas.no Fact Sheet for Healthcare Providers: BankingDealers.co.za This test is not yet approved or cleared by the [place] FDA and has been authorized for detection and/or diagnosis of SARS-CoV-2 by FDA under an Emergency Use Authorization (EUA).  This EUA will remain in effect (meaning this test can be used) for the duration of the COVID-19 declaration under Section 564(b)(1) of the Act, 21 U.S.C. section 360bbb-3(b)(1), unless the authorization is terminated or revoked sooner. Performed at [facility], [address] [place], [place] [address]   MRSA PCR Screening     Status: Abnormal   Collection Time: 01/09/19  8:47 PM  Result Value Ref Range Status   MRSA by PCR POSITIVE (A) NEGATIVE Final    Comment:        The GeneXpert MRSA Assay (FDA approved for NASAL specimens only), is one component of a comprehensive MRSA colonization surveillance program. It is not intended to diagnose MRSA infection nor to guide or monitor treatment for MRSA infections. RESULT CALLED TO, READ BACK BY AND VERIFIED WITH: TETREAULT,H @ 0007 ON 01/10/19 BY JUW Performed at [facility], [address], [place], [place] [address]   Culture, respiratory     Status: None (Preliminary result)   Collection Time: 01/09/19 11:39 PM  Result Value Ref Range Status   Specimen Description   Final    TRACHEAL ASPIRATE Performed at  [facility], [address], [place], [place] [address]    Special  Requests   Final    NONE Performed at [facility], [address], [place], [place] [address]    Gram Stain   Final    MODERATE WBC PRESENT, PREDOMINANTLY PMN RARE GRAM POSITIVE COCCI RARE YEAST    Culture   Final    MODERATE STAPHYLOCOCCUS AUREUS SUSCEPTIBILITIES TO FOLLOW Performed at [facility], [place] [address], [place], [place] [address]    Report Status PENDING  Incomplete   Studies/Results: US Venous Img Lower Bilateral  Result Date: 01/10/2019 CLINICAL DATA:  Bilateral lower extremity pain and edema. Evaluate for DVT. EXAM: BILATERAL LOWER EXTREMITY VENOUS DOPPLER ULTRASOUND TECHNIQUE: Gray-scale sonography with graded compression, as well as color Doppler and duplex ultrasound were performed to evaluate the lower extremity deep venous systems from the level of the common femoral vein and including the common femoral, femoral, profunda femoral, popliteal and calf veins including the posterior tibial, peroneal and gastrocnemius veins when visible. The superficial great saphenous vein was also interrogated. Spectral Doppler was utilized to evaluate flow at rest and with distal augmentation maneuvers in the common femoral, femoral and popliteal veins. COMPARISON:  None. FINDINGS: RIGHT LOWER EXTREMITY The right common femoral vein, saphenofemoral junction and deep femoral vein were unable to be visualized secondary to central venous catheter bandages. Femoral Vein: No evidence of thrombus. Normal compressibility, respiratory phasicity and response to augmentation. Popliteal Vein: No evidence of thrombus. Normal compressibility, respiratory phasicity and response to augmentation. Calf Veins: No evidence of thrombus. Normal compressibility and flow on color Doppler imaging. Superficial Great Saphenous Vein: No evidence of thrombus. Normal compressibility. Venous Reflux:  None. Other Findings: Moderate amount of  subcutaneous edema is noted at the level of the right thigh and lower leg. LEFT LOWER EXTREMITY Common Femoral Vein: No evidence of thrombus. Normal compressibility, respiratory phasicity and response to augmentation. Saphenofemoral Junction: No evidence of thrombus. Normal compressibility and flow on color Doppler imaging. Profunda Femoral Vein: No evidence of thrombus. Normal compressibility and flow on color Doppler imaging. Femoral Vein: No evidence of thrombus. Normal compressibility, respiratory phasicity and response to augmentation. Popliteal Vein: No evidence of thrombus. Normal compressibility, respiratory phasicity and response to augmentation. Calf Veins: No evidence of thrombus. Normal compressibility and flow on color Doppler imaging. Superficial Great Saphenous Vein: No evidence of thrombus. Normal compressibility. Venous Reflux:  None. Other Findings: Moderate amount of subcutaneous edema is noted at the level of the left lower leg. IMPRESSION: Degraded examination without evidence of DVT within either lower extremity. Electronically Signed   By: Sandi Mariscal M.D.   On: 01/10/2019 13:58   Dg Chest Port 1 View  Result Date: 01/12/2019 CLINICAL DATA:  85 year old male with respiratory failure. EXAM: PORTABLE CHEST 1 VIEW COMPARISON:  Chest radiograph dated 01/11/2019 FINDINGS: Evaluation is limited due to patient's positioning and superimposition of the mandible over the upper mediastinum. The tip of the endotracheal tube is poorly visualized but appears in similar position as before. An enteric tube extends into the left upper abdomen with side-port in the region of the gastroesophageal junction. Recommend further advancing the tube into the stomach by at least additional 5 7 cm. Cardiomegaly with vascular congestion and edema. Left lung base density. Overall slight interval improvement in the aeration of the left lung compared to the prior radiograph. No pneumothorax. No acute osseous pathology.  IMPRESSION: 1. Slight interval improvement in the aeration of the left lung compared to the prior radiograph. 2. Cardiomegaly with vascular congestion and edema. 3. Left lung base  density, not significantly changed. 4. Lines and tubes as described above. The enteric tube can be advanced for optimal positioning. Electronically Signed   By: Anner Crete M.D.   On: 01/12/2019 01:24   Dg Chest Port 1 View  Result Date: 01/11/2019 CLINICAL DATA:  Shortness of breath.  Respiratory failure. EXAM: PORTABLE CHEST 1 VIEW COMPARISON:  01/10/2019 FINDINGS: The endotracheal tube terminates above the carina by approximately 4.5 cm. The enteric tube it peers to extend below the left hemidiaphragm. The cardiac silhouette is enlarged. Aortic calcifications are noted. There is a left basilar/retrocardiac opacity, similar to prior study. No pneumothorax. Prominent interstitial lung markings are again noted IMPRESSION: 1. Lines and tubes as above. 2. Persistent left basilar airspace opacity, not significantly improved from prior study. Electronically Signed   By: Constance Holster M.D.   On: 01/11/2019 13:51   Dg Chest Port 1 View  Result Date: 01/10/2019 CLINICAL DATA:  Altered mental status. Pneumonia. EXAM: PORTABLE CHEST 1 VIEW COMPARISON:  01/10/2019. FINDINGS: Rotated radiograph. Endotracheal tube appears approximately 2 cm above carina. Enteric tube last side hole is at GE junction. The heart is enlarged. There is considerable opacity in the LEFT hemithorax, probably stable, likely atelectasis, consolidation, and effusion. Moderate vascular congestion appears worse throughout the visible RIGHT lung fields. IMPRESSION: Worsening aeration. Support tubes as described. Electronically Signed   By: Staci Righter M.D.   On: 01/10/2019 10:29   US Abdomen Limited Ruq  Result Date: 01/10/2019 CLINICAL DATA:  Abnormal LFTs on admission. Previous cholecystectomy. EXAM: ULTRASOUND ABDOMEN LIMITED RIGHT UPPER QUADRANT  COMPARISON:  None. FINDINGS: Gallbladder: Previous cholecystectomy. Common bile duct: Diameter: 8.1 mm. Liver: No focal lesion identified. Within normal limits in parenchymal echogenicity. Small amount of perihepatic fluid. Portal vein is patent on color Doppler imaging with normal direction of blood flow towards the liver. IMPRESSION: Small amount of perihepatic fluid, otherwise no acute findings. Previous cholecystectomy. Electronically Signed   By: Marin Olp M.D.   On: 01/10/2019 13:27    Medications:  Prior to Admission:  Medications Prior to Admission  Medication Sig Dispense Refill Last Dose  . acetaminophen (TYLENOL) 500 MG tablet Take 1,000 mg by mouth every 6 (six) hours as needed. Pain   Past Week at Unknown  . allopurinol (ZYLOPRIM) 300 MG tablet Take 300 mg by mouth daily.   01/08/2019 at Unknown time  . aspirin 81 MG chewable tablet Chew 81 mg by mouth daily.   01/08/2019 at 0930  . busPIRone (BUSPAR) 15 MG tablet Take 15 mg by mouth 2 (two) times daily.   01/08/2019 at Unknown time  . calcium gluconate 500 MG tablet Take 1 tablet by mouth daily.   01/08/2019 at Unknown time  . citalopram (CELEXA) 20 MG tablet Take 20 mg by mouth daily.   01/08/2019 at Unknown time  . folic acid (FOLVITE) 1 MG tablet Take 1 mg by mouth daily.   01/08/2019 at Unknown time  . gabapentin (NEURONTIN) 300 MG capsule Take 300 mg by mouth 3 (three) times daily.   01/08/2019 at Unknown time  . Melatonin 5 MG TABS Take 1 tablet by mouth at bedtime.   01/08/2019 at Unknown time  . midodrine (PROAMATINE) 2.5 MG tablet Take 2.5 mg by mouth 2 (two) times a day.   01/08/2019 at Unknown time  . Omega-3 Fatty Acids (FISH OIL) 1000 MG CAPS Take 1 capsule by mouth 2 (two) times a day.   01/08/2019 at Unknown time  . omeprazole (PRILOSEC) 20 MG  capsule Take 20 mg by mouth daily.   01/08/2019 at Unknown time  . Tamsulosin HCl (FLOMAX) 0.4 MG CAPS Take 0.4 mg by mouth daily.    01/08/2019 at Unknown time  . traMADol (ULTRAM) 50 MG tablet  Take 50 mg by mouth 3 (three) times daily.   01/08/2019 at Unknown time  . vitamin B-12 (CYANOCOBALAMIN) 1000 MCG tablet Take 1,000 mcg by mouth daily.   01/08/2019 at Unknown time  . ciprofloxacin (CIPRO) 500 MG tablet Take 500 mg by mouth 2 (two) times daily.   Completed Course at Unknown time   Scheduled: . albuterol  2.5 mg Nebulization Q4H  . chlorhexidine gluconate (MEDLINE KIT)  15 mL Mouth Rinse BID  . Chlorhexidine Gluconate Cloth  6 each Topical Q0600  . Chlorhexidine Gluconate Cloth  6 each Topical Daily  . collagenase   Topical Daily  . hydrocortisone sod succinate (SOLU-CORTEF) inj  25 mg Intravenous Q12H  . mouth rinse  15 mL Mouth Rinse 10 times per day  . mupirocin ointment  1 application Nasal BID  . sodium chloride flush  10-40 mL Intracatheter Q12H   Continuous: . sodium chloride Stopped (01/11/19 1720)  . dexmedetomidine (PRECEDEX) IV infusion 0.9 mcg/kg/hr (01/12/19 0516)  . famotidine (PEPCID) IV 20 mg (01/11/19 2211)  . feeding supplement (VITAL HIGH PROTEIN) 60 mL/hr at 01/12/19 0647  . norepinephrine (LEVOPHED) Adult infusion Stopped (01/10/19 1633)  . vancomycin Stopped (01/11/19 1425)   FXJ:OITGPQ chloride, acetaminophen, fentaNYL (SUBLIMAZE) injection, fentaNYL (SUBLIMAZE) injection, hydrALAZINE, ipratropium-albuterol, midazolam, midazolam, ondansetron (ZOFRAN) IV, sodium chloride flush  Assesment: He was admitted with healthcare associated pneumonia in the left lower lobe and sepsis from that.  He has resolved sepsis pathophysiology but he did require pressors briefly.  Pneumonia looks a little bit better on chest x-ray done earlier this morning and repeat chest x-ray is pending now.  He has acute hypoxic respiratory failure and remains on the ventilator.  He had altered mental status with acute encephalopathy.  He became agitated earlier and was treated with Precedex and is better but still on the Precedex.  He is having trouble with his tube feedings which  are off now and chest x-ray is pending Active Problems:   HCAP (healthcare-associated pneumonia)   Sepsis with acute hypoxic respiratory failure (HCC)   Hypotension   Abnormal liver function   Pressure injury of skin   Sepsis due to undetermined organism ([place])   Acute respiratory failure with hypoxia (HCC)   Thrombocytopenia (HCC)   Acute CHF (congestive heart failure) ([place])   Altered mental status   Acute metabolic encephalopathy   Goals of care, counseling/discussion   Palliative care encounter   Pancytopenia ([place])   Pneumonia of left lower lobe due to infectious organism [facility])    Plan: Doubt he will be able to be extubated today.  Will need to see what he does when he comes off of Precedex if he is agitated etc.  Continue medications.    LOS: 3 days   Alonza Bogus 01/12/2019, 8:02 AM

## 2019-01-12 NOTE — Progress Notes (Signed)
PROGRESS NOTE    John Munoz   YIR:[id number]  DOB: 23-Nov-1932  DOA: 01/09/2019 PCP: Neale Burly, MD   Brief Narrative:  John Munoz is a 85 y.o. male from home with great neice with medical history significant for COPD, depression, BPH, recent right heel pressure ulcer with cellulitis and necrosis, recent UTI, brought to the ED with reports of altered mental status, lethargic of 2 days duration.  In ED, temp 91.1, tachypnea, CT chest> left > right lower lobe infiltrates. Started on  Vanc and Cefepime for HCAP and intubated in ED. COVID neg. MRSA screen +.  Admitted for HCAP, acute diastolic CHF, acute encephalopathy. Made DNR the following day. Venous duplex 01/10/19- no DVT   Subjective: He has vomited up his tube feeds twice today.   Assessment & Plan:   Principal Problem:     Sepsis- hypothermia, hypotension   Acute respiratory failure with hypoxia due to HCAP and acute diastolic CHF - remains intubated and on Vanc, Flagyl and Cefepime-  - he has been weaned off of Levophed - cont stress dose steroids for now - CXR continues to show vascular congestion and edema- start Lasix  Active Problems:  Vomiting tube feeds - RN has tried to advance the tube as CXR suspected it was in his esophagus- despite this, he continues to vomit- he is a high aspiration risk and with repeated aspirations, a high risk for death despite being intubated- will hold tube feeds for today and reassess tomorrow  Electrolyte abnormalities - low K, Mag and phos- will replace and follow     Abnormal liver function/ thrombocytopenia - suspected to be due to sepsis- following    Pressure injury of skin- stage 3 left heel - appreciate wound care eval - recommendations> Enzymatic debridement and saline moist gauze. Cover with dry dressing. Change daily.    Time spent in minutes: 45 min DVT prophylaxis: SCDs Code Status: DNR- family will consider transitioning to comfort if condition does not  improve in next few days Family Communication:  Disposition Plan: follow in ICU Consultants:   pulmonary Procedures:  2 D ECHO 1. The left ventricle has normal systolic function, with an ejection fraction of 55-60%. The cavity size was normal. There is mild concentric left ventricular hypertrophy. Left ventricular diastolic Doppler parameters are indeterminate. No evidence of  left ventricular regional wall motion abnormalities.  2. The right ventricle has normal systolic function. The cavity was normal. There is no increase in right ventricular wall thickness.  3. Left atrial size was mildly dilated.  4. The mitral valve is grossly normal. Mild thickening of the mitral valve leaflet. There is mild to moderate mitral annular calcification present.  5. The tricuspid valve is grossly normal.  6. The aortic valve is tricuspid. Mild thickening of the aortic valve. Mild calcification of the aortic valve. Aortic valve regurgitation is trivial by color flow Doppler.  7. Moderate aortic annular calcification.  8. The aortic root is normal in size and structure.  9. The inferior vena cava was dilated in size with <50% respiratory variability.  Antimicrobials:  Anti-infectives (From admission, onward)   Start     Dose/Rate Route Frequency Ordered Stop   01/13/19 1300  vancomycin (VANCOCIN) 1,750 mg in sodium chloride 0.9 % 500 mL IVPB     1,750 mg 250 mL/hr over 120 Minutes Intravenous Every 24 hours 01/12/19 1306     01/10/19 1230  vancomycin (VANCOCIN) 1,500 mg in sodium chloride 0.9 % 500 mL  IVPB  Status:  Discontinued     1,500 mg 250 mL/hr over 120 Minutes Intravenous Every 24 hours 01/09/19 1230 01/10/19 0759   01/10/19 1230  vancomycin (VANCOCIN) 1,500 mg in sodium chloride 0.9 % 500 mL IVPB     1,500 mg 250 mL/hr over 120 Minutes Intravenous Every 24 hours 01/10/19 0800 01/12/19 2359   01/09/19 2000  ceFEPIme (MAXIPIME) 2 g in sodium chloride 0.9 % 100 mL IVPB     2 g 200 mL/hr over  30 Minutes Intravenous Every 8 hours 01/09/19 1219 01/11/19 2211   01/09/19 1330  vancomycin (VANCOCIN) 500 mg in sodium chloride 0.9 % 100 mL IVPB     500 mg 100 mL/hr over 60 Minutes Intravenous  Once 01/09/19 1221 01/09/19 1502   01/09/19 1115  ceFEPIme (MAXIPIME) 2 g in sodium chloride 0.9 % 100 mL IVPB     2 g 200 mL/hr over 30 Minutes Intravenous  Once 01/09/19 1112 01/09/19 1214   01/09/19 1115  metroNIDAZOLE (FLAGYL) IVPB 500 mg     500 mg 100 mL/hr over 60 Minutes Intravenous  Once 01/09/19 1112 01/09/19 1239   01/09/19 1115  vancomycin (VANCOCIN) IVPB 1000 mg/200 mL premix     1,000 mg 200 mL/hr over 60 Minutes Intravenous  Once 01/09/19 1112 01/09/19 1336       Objective: Vitals:   01/12/19 1115 01/12/19 1203 01/12/19 1300 01/12/19 1330  BP: (!) 151/82  (!) 155/86 (!) 150/85  Pulse: 83  85 82  Resp: '17  18 16  ' Temp: 98.8 F (37.1 C)     TempSrc: Axillary     SpO2: 96% 95% 93% 92%  Weight:      Height:        Intake/Output Summary (Last 24 hours) at 01/12/2019 1617 Last data filed at 01/12/2019 1432 Gross per 24 hour  Intake 925.94 ml  Output 2500 ml  Net -1574.06 ml   Filed Weights   01/10/19 0445 01/11/19 0500 01/11/19 1125  Weight: 75.3 kg 80.1 kg 79.5 kg    Examination: General exam: Appears comfortable  HEENT: PERRLA, oral mucosa moist, no sclera icterus or thrush Respiratory system: Clear to auscultation. Respiratory effort normal. Cardiovascular system: S1 & S2 heard, RRR.   Gastrointestinal system: Abdomen soft, non-tender, nondistended. Normal bowel sounds. Central nervous system: sedated Extremities: No cyanosis, clubbing or edema Skin: No rashes or ulcers    Data Reviewed: I have personally reviewed following labs and imaging studies  CBC: Recent Labs  Lab 01/09/19 1113 01/10/19 0413 01/10/19 0546 01/10/19 2003 01/11/19 0419 01/12/19 0525  WBC 3.7* 4.5  --   --  3.6* 10.1  NEUTROABS 2.9  --   --   --   --   --   HGB 8.0* 7.2*  --   9.5* 9.9* 11.0*  HCT 23.7* 21.2* 20.2* 29.1* 29.9* 32.7*  MCV 100.0 99.5  --   --  94.9 94.2  PLT 101* 84*  --   --  65* 63*   Basic Metabolic Panel: Recent Labs  Lab 01/09/19 1113 01/10/19 0413 01/11/19 0419 01/12/19 0525  NA 136 138 140 140  K 4.3 3.8 3.3* 3.0*  CL 97* 103 108 105  CO2 '29 26 23 25  ' GLUCOSE 125* 69* 156* 186*  BUN 25* 26* 20 22  CREATININE 0.54* 0.88 0.72 0.59*  CALCIUM 8.9 8.4* 8.5* 9.0  MG  --   --   --  1.6*  PHOS  --   --   --  1.7*   GFR: Estimated Creatinine Clearance: 67.7 mL/min (A) (by C-G formula based on SCr of 0.59 mg/dL (L)). Liver Function Tests: Recent Labs  Lab 01/09/19 1113 01/10/19 0413 01/11/19 0419 01/12/19 0525  AST 63* 72* 87* 56*  ALT 49* 47* 53* 49*  ALKPHOS 106 95 99 92  BILITOT 0.5 0.5 1.0 0.7  PROT 6.0* 5.4* 5.9* 5.9*  ALBUMIN 3.0* 2.7* 2.9* 2.7*   No results for input(s): LIPASE, AMYLASE in the last 168 hours. No results for input(s): AMMONIA in the last 168 hours. Coagulation Profile: Recent Labs  Lab 01/10/19 0935  INR 1.1   Cardiac Enzymes: Recent Labs  Lab 01/09/19 1516 01/10/19 0413 01/10/19 0546 01/10/19 0935 01/10/19 1516 01/10/19 2003  CKTOTAL 551*  --   --   --   --   --   TROPONINI <0.03 0.46* 0.54* 0.78* 0.89* 0.93*   BNP (last 3 results) No results for input(s): PROBNP in the last 8760 hours. HbA1C: No results for input(s): HGBA1C in the last 72 hours. CBG: Recent Labs  Lab 01/11/19 1941 01/11/19 2348 01/12/19 0428 01/12/19 0734 01/12/19 1112  GLUCAP 166* 191* 169* 170* 147*   Lipid Profile: No results for input(s): CHOL, HDL, LDLCALC, TRIG, CHOLHDL, LDLDIRECT in the last 72 hours. Thyroid Function Tests: No results for input(s): TSH, T4TOTAL, FREET4, T3FREE, THYROIDAB in the last 72 hours. Anemia Panel: Recent Labs    01/10/19 0546 01/10/19 0935  VITAMINB12 1,331*  --   FOLATE  --  18.8  FERRITIN 266  --   TIBC 277  --   IRON 75  --    Urine analysis:    Component Value  Date/Time   COLORURINE YELLOW 01/09/2019 [place] 01/09/2019 1114   LABSPEC 1.017 01/09/2019 1114   PHURINE 5.0 01/09/2019 1114   GLUCOSEU NEGATIVE 01/09/2019 [place] 01/09/2019 [place] 01/09/2019 [place] 01/09/2019 1114   PROTEINUR NEGATIVE 01/09/2019 1114   NITRITE NEGATIVE 01/09/2019 1114   LEUKOCYTESUR TRACE (A) 01/09/2019 1114   Sepsis Labs: '@[email or web address]' (procalcitonin:4,lacticidven:4) ) Recent Results (from the past 240 hour(s))  Blood Culture (routine x 2)     Status: None (Preliminary result)   Collection Time: 01/09/19 11:13 AM  Result Value Ref Range Status   Specimen Description BLOOD LEFT ARM BOTTLES DRAWN AEROBIC AND ANAEROBIC  Final   Special Requests Blood Culture adequate volume  Final   Culture   Final    NO GROWTH 3 DAYS Performed at [facility], [address], [place], [place] [address]    Report Status PENDING  Incomplete  Blood Culture (routine x 2)     Status: None (Preliminary result)   Collection Time: 01/09/19 11:35 AM  Result Value Ref Range Status   Specimen Description   Final    LEFT ANTECUBITAL BOTTLES DRAWN AEROBIC AND ANAEROBIC   Special Requests Blood Culture adequate volume  Final   Culture   Final    NO GROWTH 3 DAYS Performed at [facility], [address], [place], [place] [address]    Report Status PENDING  Incomplete  SARS Coronavirus 2 (CEPHEID - Performed in [place] hospital lab), Hosp Order     Status: None   Collection Time: 01/09/19  2:58 PM  Result Value Ref Range Status   SARS Coronavirus 2 NEGATIVE NEGATIVE Final    Comment: (NOTE) If result is NEGATIVE SARS-CoV-2 target nucleic acids are NOT DETECTED. The SARS-CoV-2 RNA is generally  detectable in upper and lower  respiratory specimens during the acute phase of infection. The lowest  concentration of SARS-CoV-2 viral copies this assay can detect is 250  copies / mL. A negative result does not preclude  SARS-CoV-2 infection  and should not be used as the sole basis for treatment or other  patient management decisions.  A negative result may occur with  improper specimen collection / handling, submission of specimen other  than nasopharyngeal swab, presence of viral mutation(s) within the  areas targeted by this assay, and inadequate number of viral copies  (<250 copies / mL). A negative result must be combined with clinical  observations, patient history, and epidemiological information. If result is POSITIVE SARS-CoV-2 target nucleic acids are DETECTED. The SARS-CoV-2 RNA is generally detectable in upper and lower  respiratory specimens dur ing the acute phase of infection.  Positive  results are indicative of active infection with SARS-CoV-2.  Clinical  correlation with patient history and other diagnostic information is  necessary to determine patient infection status.  Positive results do  not rule out bacterial infection or co-infection with other viruses. If result is PRESUMPTIVE POSTIVE SARS-CoV-2 nucleic acids MAY BE PRESENT.   A presumptive positive result was obtained on the submitted specimen  and confirmed on repeat testing.  While 2019 novel coronavirus  (SARS-CoV-2) nucleic acids may be present in the submitted sample  additional confirmatory testing may be necessary for epidemiological  and / or clinical management purposes  to differentiate between  SARS-CoV-2 and other Sarbecovirus currently known to infect humans.  If clinically indicated additional testing with an alternate test  methodology [id number]) is advised. The SARS-CoV-2 RNA is generally  detectable in upper and lower respiratory sp ecimens during the acute  phase of infection. The expected result is Negative. Fact Sheet for Patients:  StrictlyIdeas.no Fact Sheet for Healthcare Providers: BankingDealers.co.za This test is not yet approved or cleared by the  [place] FDA and has been authorized for detection and/or diagnosis of SARS-CoV-2 by FDA under an Emergency Use Authorization (EUA).  This EUA will remain in effect (meaning this test can be used) for the duration of the COVID-19 declaration under Section 564(b)(1) of the Act, 21 U.S.C. section 360bbb-3(b)(1), unless the authorization is terminated or revoked sooner. Performed at [facility], [address] [place], [place] [address]   MRSA PCR Screening     Status: Abnormal   Collection Time: 01/09/19  8:47 PM  Result Value Ref Range Status   MRSA by PCR POSITIVE (A) NEGATIVE Final    Comment:        The GeneXpert MRSA Assay (FDA approved for NASAL specimens only), is one component of a comprehensive MRSA colonization surveillance program. It is not intended to diagnose MRSA infection nor to guide or monitor treatment for MRSA infections. RESULT CALLED TO, READ BACK BY AND VERIFIED WITH: TETREAULT,H @ 0007 ON 01/10/19 BY JUW Performed at [facility], [address], [place], [place] [address]   Culture, respiratory     Status: None   Collection Time: 01/09/19 11:39 PM  Result Value Ref Range Status   Specimen Description   Final    TRACHEAL ASPIRATE Performed at [facility], [address], [place], [place] [address]    Special Requests   Final    NONE Performed at [facility], [address], [place], [place] [address]    Gram Stain   Final    MODERATE WBC PRESENT, PREDOMINANTLY PMN RARE GRAM POSITIVE COCCI  RARE YEAST Performed at [facility], [address], [place], [place] [address]    Culture   Final    MODERATE METHICILLIN RESISTANT STAPHYLOCOCCUS AUREUS   Report Status 01/12/2019 FINAL  Final   Organism ID, Bacteria METHICILLIN RESISTANT STAPHYLOCOCCUS AUREUS  Final      Susceptibility   Methicillin resistant staphylococcus aureus - MIC*    CIPROFLOXACIN >=8 RESISTANT Resistant     ERYTHROMYCIN >=8 RESISTANT Resistant     GENTAMICIN <=0.5  SENSITIVE Sensitive     OXACILLIN >=4 RESISTANT Resistant     TETRACYCLINE <=1 SENSITIVE Sensitive     VANCOMYCIN 1 SENSITIVE Sensitive     TRIMETH/SULFA <=10 SENSITIVE Sensitive     CLINDAMYCIN <=0.25 SENSITIVE Sensitive     RIFAMPIN <=0.5 SENSITIVE Sensitive     Inducible Clindamycin NEGATIVE Sensitive     * MODERATE METHICILLIN RESISTANT STAPHYLOCOCCUS AUREUS         Radiology Studies: Dg Chest Port 1 View  Result Date: 01/12/2019 CLINICAL DATA:  85 year old male NG tube placement. EXAM: PORTABLE CHEST 1 VIEW COMPARISON:  0046 hours today and earlier. FINDINGS: Portable AP semi upright view at 0810 hours. Enteric tube in place and courses to the abdomen. Tip is not included, but the side hole might be visible near the GE junction. Stable lung volumes and ventilation. Stable cardiac size and mediastinal contours. Endotracheal tube tip is not as well visualized but likely stable at the level the clavicles. IMPRESSION: 1. Enteric tube courses to the abdomen. Tip is not included, but the side hole might be visible near the GE junction. Consider advancing 5 cm to ensure side hole placement within the stomach. 2. Otherwise stable chest. Electronically Signed   By: Genevie Ann M.D.   On: 01/12/2019 08:26   Dg Chest Port 1 View  Result Date: 01/12/2019 CLINICAL DATA:  85 year old male with respiratory failure. EXAM: PORTABLE CHEST 1 VIEW COMPARISON:  Chest radiograph dated 01/11/2019 FINDINGS: Evaluation is limited due to patient's positioning and superimposition of the mandible over the upper mediastinum. The tip of the endotracheal tube is poorly visualized but appears in similar position as before. An enteric tube extends into the left upper abdomen with side-port in the region of the gastroesophageal junction. Recommend further advancing the tube into the stomach by at least additional 5 7 cm. Cardiomegaly with vascular congestion and edema. Left lung base density. Overall slight interval  improvement in the aeration of the left lung compared to the prior radiograph. No pneumothorax. No acute osseous pathology. IMPRESSION: 1. Slight interval improvement in the aeration of the left lung compared to the prior radiograph. 2. Cardiomegaly with vascular congestion and edema. 3. Left lung base density, not significantly changed. 4. Lines and tubes as described above. The enteric tube can be advanced for optimal positioning. Electronically Signed   By: Anner Crete M.D.   On: 01/12/2019 01:24   Dg Chest Port 1 View  Result Date: 01/11/2019 CLINICAL DATA:  Shortness of breath.  Respiratory failure. EXAM: PORTABLE CHEST 1 VIEW COMPARISON:  01/10/2019 FINDINGS: The endotracheal tube terminates above the carina by approximately 4.5 cm. The enteric tube it peers to extend below the left hemidiaphragm. The cardiac silhouette is enlarged. Aortic calcifications are noted. There is a left basilar/retrocardiac opacity, similar to prior study. No pneumothorax. Prominent interstitial lung markings are again noted IMPRESSION: 1. Lines and tubes as above. 2. Persistent left basilar airspace opacity, not significantly improved from prior study. Electronically Signed   By: Harrell Gave  Green M.D.   On: 01/11/2019 13:51   [place] Ekg Site Rite  Result Date: 01/12/2019 If Site Rite image not attached, placement could not be confirmed due to current cardiac rhythm.     Scheduled Meds: . albuterol  2.5 mg Nebulization Q4H  . chlorhexidine gluconate (MEDLINE KIT)  15 mL Mouth Rinse BID  . Chlorhexidine Gluconate Cloth  6 each Topical Q0600  . Chlorhexidine Gluconate Cloth  6 each Topical Daily  . collagenase   Topical Daily  . hydrocortisone sod succinate (SOLU-CORTEF) inj  25 mg Intravenous Q12H  . mouth rinse  15 mL Mouth Rinse 10 times per day  . mupirocin ointment  1 application Nasal BID  . sodium chloride flush  10-40 mL Intracatheter Q12H   Continuous Infusions: . sodium chloride Stopped (01/11/19  1720)  . dexmedetomidine (PRECEDEX) IV infusion 0.8 mcg/kg/hr (01/12/19 1317)  . famotidine (PEPCID) IV 20 mg (01/12/19 1014)  . norepinephrine (LEVOPHED) Adult infusion Stopped (01/10/19 1633)  . vancomycin 1,500 mg (01/12/19 1421)  . [START ON 01/13/2019] vancomycin       LOS: 3 days      Debbe Odea, MD [facility] Pager: [email or web address] Password [facility] 01/12/2019, 4:17 PM

## 2019-01-12 NOTE — Progress Notes (Signed)
Upon walking into room pt had tube feeding coming out of mouth and puddle noted on his gown. Feeding immediately stopped. Re-measured external length at 45cm. Mouth suctioned, no tube feeding noted in ETT suction. MD Rizwan notified and stat chest x-ray ordered.

## 2019-01-12 NOTE — Progress Notes (Signed)
Per MD Rizwan, stop tube feeding at this time. Possibly start tube feed back at 1800 at 67mL/hr.

## 2019-01-12 NOTE — Progress Notes (Signed)
Peripherally Inserted Central Catheter/Midline Placement  The IV Nurse has discussed with the patient and/or persons authorized to consent for the patient, the purpose of this procedure and the potential benefits and risks involved with this procedure.  The benefits include less needle sticks, lab draws from the catheter, and the patient may be discharged home with the catheter. Risks include, but not limited to, infection, bleeding, blood clot (thrombus formation), and puncture of an artery; nerve damage and irregular heartbeat and possibility to perform a PICC exchange if needed/ordered by physician.  Alternatives to this procedure were also discussed.  Bard Power PICC patient education guide, fact sheet on infection prevention and patient information card has been provided to patient /or left at bedside.    PICC/Midline Placement Documentation  PICC Triple Lumen 01/12/19 PICC Right Brachial 39 cm 0 cm (Active)  Indication for Insertion or Continuance of Line Prolonged intravenous therapies 01/12/2019  4:32 PM  Exposed Catheter (cm) 0 cm 01/12/2019  4:32 PM  Site Assessment Clean;Dry;Intact 01/12/2019  4:32 PM  Lumen #1 Status Flushed;Blood return noted;Saline locked 01/12/2019  4:32 PM  Lumen #2 Status Flushed;Blood return noted;Saline locked 01/12/2019  4:32 PM  Lumen #3 Status Flushed;Blood return noted;Saline locked 01/12/2019  4:32 PM  Dressing Type Transparent 01/12/2019  4:32 PM  Dressing Status Clean;Dry;Intact;Antimicrobial disc in place 01/12/2019  4:32 PM  Dressing Change Due 01/19/19 01/12/2019  4:32 PM       John Munoz 01/12/2019, 4:34 PM

## 2019-01-12 NOTE — Care Management Important Message (Deleted)
Important Message  Patient Details  Name: John Munoz MRN: [id number] Date of Birth: 1933/01/21   Medicare Important Message Given:  Yes    [name] 01/12/2019, 2:57 PM

## 2019-01-12 NOTE — Progress Notes (Signed)
Palliative:  Mr. John Munoz continues on vent and continues with not following commands. He is now on Precedex for agitation and appears to be resting more comfortably. He does not open his eyes at all. Concern for possible tube feeding coming out of mouth and possible aspiration event this morning - to be monitored. CXR shows some slight improvement.   I called great-niece, Julianne Rice. Gave her all updates above. She understands that he may not improve much further and I worry about his neurological status at this stage. Julianne Rice would like to give him more time to see if he can improve which seems reasonable to me. She also does not want to continue him on vent with aggressive interventions if it is not helping him and would consider comfort if he were to decline further or not progress further over the next couple days. She will communicate updates to her mother and grandmother. They are worried about his sister's ability to deal with his decline in her elderly age and having just lost another family member recently. I will hold off on calling her for now. I do trust that family are communicating.   Exam: No purposeful movements or activity, not following commands, not opening eyes. Ventilated - tolerating. On Precedex. No distress.   Plan: - Continue current care.  - With further decline or lack of further improvement over the next few days family would consider extubation and comfort as appropriate.  - Will continue to follow and discuss progress and support for family.   25 min  Vinie Sill, NP Palliative Medicine Team Pager # [phone] (M-F 8a-5p) Team Phone # [phone] (Nights/Weekends)

## 2019-01-12 NOTE — Progress Notes (Signed)
Per CXR- OG tube needs to be advanced 5-7 cm. OG tube advanced 6cm and is now at 42cm at the lip. Secured in place, TFs restarted. Will continue to monitor pt

## 2019-01-12 NOTE — Progress Notes (Signed)
:  Late entry from 0415-RN went in to assess patient for 0400 rounds- noticed blood on sheet. Pts femoral line is bleeding profusely. Dressing had already been changed @ [facility] d/t bleeding. Blood is on sheets, gown- pressure held on site for 10 min.  Pressure dressing placed via CN. Dr. Myna Hidalgo paged and made aware- No new orders at this time. Will continue to monitor pt.

## 2019-01-12 NOTE — Progress Notes (Signed)
This RN and Mudlogger obtained verbal consent via phone from great niece, Link Snuffer, for PICC line. PICC line team notified of need and consent. Will call when they are in route.

## 2019-01-12 NOTE — Progress Notes (Signed)
Advanced OG further per x-ray, 36 cm external length at this time. Tube feed resumed.

## 2019-01-12 NOTE — Progress Notes (Addendum)
Nutrition Follow-up   DOCUMENTATION CODES:    INTERVENTION:   Maintain continuous Vital High Protein @ 50 ml/hr via OGT  -add prostat 30 ml BID via tube.  Tube feeding regimen provides 1400 kcal, 134 grams of protein, and 1004 ml of H2O.     Add Free water flushes -250 ml QID to meet est normal needs  Replace potassium, mag and phos. -per MD discretion    NUTRITION DIAGNOSIS:   Inadequate oral intake related to (S) inability to eat, acute illness(respiratory failure-bilateral PE) as evidenced by NPO status.  GOAL:   Provide needs based on ASPEN/SCCM guidelines MONITOR:   Vent status, Weight trends, Labs, I & O's, Skin   REASON FOR ASSESSMENT:   Ventilator    ASSESSMENT: Patient is an 85 yo male presents on 5/10 with infection.  Stage 3 PI to right heel. History of recurrent UTI's and altered mental status.   -Central line placed on 5/11. -CT-Bilateral pleural effusions Patient is an 85 yo male presents on 5/10 with infection.  Patient is currently intubated on ventilator support-5/10 due to acute respiratory failure. ETT secured at 24 cm. Palliative talked with family yesterday and current plan is for aggressive care approach. Weight gain from 74.6 kg to 80 kg since admission. Needs calculated based on 74.6 kg.  Nursing reports problems with tube feeding draining from oral cavity. Rate decreased to 50 ml /hr  Intake/Output Summary (Last 24 hours) at 01/12/2019 1246 Last data filed at 01/12/2019 0730 Gross per 24 hour  Intake 1782.68 ml  Output 1950 ml  Net -167.32 ml    MV: 8  L/min Temp (24hrs), Avg:98.1 F (36.7 C), Min:97.5 F (36.4 C), Max:98.8 F (37.1 C) Sedation :Elayne Snare Danford Bad -Precedex   Medications reviewed and include: Pepcid  IVF-KVO  Labs: K+-3.0 (L),  phos-1.7 (L), Mag 1.6 -borderline low BMP Latest Ref Rng & Units 01/12/2019 01/11/2019 01/10/2019  Glucose 70 - 99 mg/dL 186(H) 156(H) 69(L)  BUN 8 - 23 mg/dL 22 20 26(H)  Creatinine 0.61 -  1.24 mg/dL 0.59(L) 0.72 0.88  Sodium 135 - 145 mmol/L 140 140 138  Potassium 3.5 - 5.1 mmol/L 3.0(L) 3.3(L) 3.8  Chloride 98 - 111 mmol/L 105 108 103  CO2 22 - 32 mmol/L 25 23 26   Calcium 8.9 - 10.3 mg/dL 9.0 8.5(L) 8.4(L)    NUTRITION - FOCUSED PHYSICAL EXAM:  Unable to complete Nutrition-Focused physical exam at this time.    Diet Order:   Diet Order            Diet NPO time specified  Diet effective now              EDUCATION NEEDS:   Not appropriate for education at this time   Skin:  Skin Assessment: Skin Integrity Issues: Skin Integrity Issues:: Stage III Stage III: right heel and open area l/r buttocks  Last BM:  5/11  Height:   Ht Readings from Last 1 Encounters:  01/12/19 5\' 8"  (1.727 m)    Weight:   Wt Readings from Last 1 Encounters:  01/11/19 79.5 kg    Ideal Body Weight:  70 kg  BMI:  Body mass index is 26.65 kg/m.  Estimated Nutritional Needs:   Kcal:  [id number] ([id number])  Protein:  113-135 (1.5-1.8 gr/kg/adm wt)  Fluid:  per MD  Colman Cater MS,RD,CSG,LDN Office: [phone] Pager: [phone]

## 2019-01-12 NOTE — Progress Notes (Signed)
Late entry from 0545-Pts L femoral dressing continues to bleed. Pt bathed, dressing changed again and a pressure dressing was placed. PIV dressing on L forearm was changed as well d/t bleeding on dressing.   Dr.Opyd discontinued pts Lovenox- SCDs placed per MD order. Will continue to monitor pt

## 2019-01-12 NOTE — Care Management Important Message (Signed)
Important Message  Patient Details  Name: John Munoz MRN: [id number] Date of Birth: 28-Jan-1933   Medicare Important Message Given:  Yes(given to nurse to deliver to patient due to being on precaution)    [name] 01/12/2019, 4:54 PM

## 2019-01-12 NOTE — Progress Notes (Signed)
Pts TFs increased to 51ml/hr per Nutritionist order. Rate increased late d/t TFs being on hold for 2 hours last night because of OGT displacement. Pt tolerating TFs well. Will pass on to dayshift nurse and continue to monitor pt.

## 2019-01-12 NOTE — Progress Notes (Addendum)
Pharmacy Antibiotic Note  John Munoz is a 85 y.o. male admitted on 01/09/2019 with infection- unknown source.  Pharmacy has been consulted for Vancomycin dosing.  Vancomycin trough level 9, goal 10-15, subtherapeutic   Plan: Vancomycin 1750 mg IV every 24 hours.  Goal trough 15-20 mcg/mL.  Cefepime 2000 mg IV every 8 hours. Monitor labs, c/s, and vanco levels as indicated.  Height: 5\' 8"  (172.7 cm) Weight: 175 lb 4.3 oz (79.5 kg) IBW/kg (Calculated) : 68.4  Temp (24hrs), Avg:98.1 F (36.7 C), Min:97.5 F (36.4 C), Max:98.8 F (37.1 C)  Recent Labs  Lab 01/09/19 1113 01/09/19 1303 01/10/19 0413 01/11/19 0419 01/12/19 0525 01/12/19 1139  WBC 3.7*  --  4.5 3.6* 10.1  --   CREATININE 0.54*  --  0.88 0.72 0.59*  --   LATICACIDVEN 0.9 0.6  --   --   --   --   VANCOTROUGH  --   --   --   --   --  9*    Estimated Creatinine Clearance: 67.7 mL/min (A) (by C-G formula based on SCr of 0.59 mg/dL (L)).    No Known Allergies  Antimicrobials this admission: Vanco 5/10 >>  Cefepime 5/10 >>  Flagyl 5/10 >>5/11  Dose adjustments this admission: Vanco  Microbiology results: 5/10 BCx: NGTD 5/10 Covid 19: Negative 5/10 MRSA PCR: Negative 5/10 Resp Culture: Moderate MRSA S-vancomycin   Thank you for allowing pharmacy to be a part of this patient's care.  John Munoz 01/12/2019 1:06 PM

## 2019-01-12 NOTE — Progress Notes (Signed)
Pts OG tube measuring 60cm instead of 48. OG tube pushed back to 46cm-verified with CN James. STAT CXR ordered to check placement.  TFs have been turned off at this time until CXR placement can be verified.

## 2019-01-13 ENCOUNTER — Inpatient Hospital Stay (HOSPITAL_COMMUNITY): Payer: Medicare Other

## 2019-01-13 LAB — GLUCOSE, CAPILLARY
Glucose-Capillary: 109 mg/dL — ABNORMAL HIGH (ref 70–99)
Glucose-Capillary: 110 mg/dL — ABNORMAL HIGH (ref 70–99)
Glucose-Capillary: 124 mg/dL — ABNORMAL HIGH (ref 70–99)
Glucose-Capillary: 129 mg/dL — ABNORMAL HIGH (ref 70–99)
Glucose-Capillary: 131 mg/dL — ABNORMAL HIGH (ref 70–99)
Glucose-Capillary: 132 mg/dL — ABNORMAL HIGH (ref 70–99)
Glucose-Capillary: 147 mg/dL — ABNORMAL HIGH (ref 70–99)

## 2019-01-13 LAB — CBC
HCT: 30.2 % — ABNORMAL LOW (ref 39.0–52.0)
Hemoglobin: 10.3 g/dL — ABNORMAL LOW (ref 13.0–17.0)
MCH: 31.8 pg (ref 26.0–34.0)
MCHC: 34.1 g/dL (ref 30.0–36.0)
MCV: 93.2 fL (ref 80.0–100.0)
Platelets: 66 10*3/uL — ABNORMAL LOW (ref 150–400)
RBC: 3.24 MIL/uL — ABNORMAL LOW (ref 4.22–5.81)
RDW: 17.1 % — ABNORMAL HIGH (ref 11.5–15.5)
WBC: 7.1 10*3/uL (ref 4.0–10.5)
nRBC: 0 % (ref 0.0–0.2)

## 2019-01-13 LAB — BLOOD GAS, ARTERIAL
Acid-Base Excess: 7.9 mmol/L — ABNORMAL HIGH (ref 0.0–2.0)
Bicarbonate: 31.8 mmol/L — ABNORMAL HIGH (ref 20.0–28.0)
Drawn by: 41977
FIO2: 40
MECHVT: 540 mL
O2 Saturation: 96.8 %
Patient temperature: 37
RATE: 16 resp/min
pCO2 arterial: 38.3 mmHg (ref 32.0–48.0)
pH, Arterial: 7.522 — ABNORMAL HIGH (ref 7.350–7.450)
pO2, Arterial: 90 mmHg (ref 83.0–108.0)

## 2019-01-13 LAB — BASIC METABOLIC PANEL
Anion gap: 12 (ref 5–15)
Anion gap: 8 (ref 5–15)
BUN: 17 mg/dL (ref 8–23)
BUN: 19 mg/dL (ref 8–23)
CO2: 29 mmol/L (ref 22–32)
CO2: 29 mmol/L (ref 22–32)
Calcium: 8.5 mg/dL — ABNORMAL LOW (ref 8.9–10.3)
Calcium: 8 mg/dL — ABNORMAL LOW (ref 8.9–10.3)
Chloride: 97 mmol/L — ABNORMAL LOW (ref 98–111)
Chloride: 97 mmol/L — ABNORMAL LOW (ref 98–111)
Creatinine, Ser: 0.6 mg/dL — ABNORMAL LOW (ref 0.61–1.24)
Creatinine, Ser: 0.52 mg/dL — ABNORMAL LOW (ref 0.61–1.24)
GFR calc Af Amer: >60 mL/min (ref >60)
GFR calc Af Amer: >60 mL/min (ref >60)
GFR calc non Af Amer: >60 mL/min (ref >60)
GFR calc non Af Amer: >60 mL/min (ref >60)
Glucose, Bld: 120 mg/dL — ABNORMAL HIGH (ref 70–99)
Glucose, Bld: 148 mg/dL — ABNORMAL HIGH (ref 70–99)
Potassium: 2.7 mmol/L — CL (ref 3.5–5.1)
Potassium: 3.3 mmol/L — ABNORMAL LOW (ref 3.5–5.1)
Sodium: 138 mmol/L (ref 135–145)
Sodium: 134 mmol/L — ABNORMAL LOW (ref 135–145)

## 2019-01-13 LAB — PHOSPHORUS
Phosphorus: 3.2 mg/dL (ref 2.5–4.6)
Phosphorus: 3.4 mg/dL (ref 2.5–4.6)

## 2019-01-13 LAB — MAGNESIUM
Magnesium: 1.6 mg/dL — ABNORMAL LOW (ref 1.7–2.4)
Magnesium: 1.6 mg/dL — ABNORMAL LOW (ref 1.7–2.4)

## 2019-01-13 MED ORDER — VITAL HIGH PROTEIN PO LIQD
1000.0000 mL | ORAL | Status: DC
  Administered 2019-01-13 – 2019-01-14 (×2): 1000 mL
  Filled 2019-01-13 (×5): qty 1000

## 2019-01-13 MED ORDER — MAGNESIUM SULFATE 4 GM/100ML IV SOLN
4.0000 g | Freq: Once | INTRAVENOUS | Status: AC
  Administered 2019-01-13: 4 g via INTRAVENOUS
  Filled 2019-01-13: qty 100

## 2019-01-13 MED ORDER — POTASSIUM CHLORIDE 20 MEQ/15ML (10%) PO SOLN
40.0000 meq | ORAL | Status: AC
  Administered 2019-01-13 (×2): 40 meq via ORAL
  Filled 2019-01-13 (×2): qty 30

## 2019-01-13 NOTE — Progress Notes (Signed)
Nutrition Follow-up   DOCUMENTATION CODES:    INTERVENTION:  Vital High Protein @ 30 ml/hr via OGT    Free water per MD  NUTRITION DIAGNOSIS:   Inadequate oral intake related to inability to eat, acute illness(respiratory failure-bilateral PE) as evidenced by NPO status.  GOAL:   Provide needs based on ASPEN/SCCM guidelines   MONITOR:   Vent status, Weight trends, Labs, I & O's, Skin   REASON FOR ASSESSMENT:   Ventilator    ASSESSMENT: Patient is an 85 yo male presents on 5/10 with infection.  Stage 3 PI to right heel. History of recurrent UTI's and altered mental status.   -Central line placed on 5/11. -CT-Bilateral pleural effusions Patient is an 85 yo male presents on 5/10 with infection.  Patient is currently intubated on ventilator support-5/10 due to acute respiratory failure. ETT secured at 24 cm. Palliative talked with family yesterday and current plan is for aggressive care approach. Weight gain from 74.6 kg to 80 kg since admission. Needs calculated based on 74.6 kg.  5/14- tube feeding held yesterday-patient vomited. Ready to resume slowly per MD. RD will continue to follow and resume protein modular and advance rate as MD clears pt.   Intake/Output Summary (Last 24 hours) at 01/13/2019 1240 Last data filed at 01/13/2019 2263 Gross per 24 hour  Intake 1877.5 ml  Output 4600 ml  Net -2722.5 ml    MV: 8  L/min Temp (24hrs), Avg:98.7 F (37.1 C), Min:98 F (36.7 C), Max:99.3 F (37.4 C) Sedation :Elayne Snare Danford Bad -Precedex   Medications reviewed and include: Pepcid  Labs: K+-2.7 (L), Phos-wnl  Mag 1.6 -borderline low BMP Latest Ref Rng & Units 01/13/2019 01/12/2019 01/12/2019  Glucose 70 - 99 mg/dL 120(H) 129(H) 186(H)  BUN 8 - 23 mg/dL 17 19 22   Creatinine 0.61 - 1.24 mg/dL 0.60(L) 0.52(L) 0.59(L)  Sodium 135 - 145 mmol/L 138 136 140  Potassium 3.5 - 5.1 mmol/L 2.7(LL) 2.8(L) 3.0(L)  Chloride 98 - 111 mmol/L 97(L) 101 105  CO2 22 - 32 mmol/L 29 27 25    Calcium 8.9 - 10.3 mg/dL 8.5(L) 8.5(L) 9.0    NUTRITION - FOCUSED PHYSICAL EXAM:  Unable to complete Nutrition-Focused physical exam at this time.    Diet Order:   Diet Order            Diet NPO time specified  Diet effective now              EDUCATION NEEDS:   Not appropriate for education at this time   Skin:  Skin Assessment: Skin Integrity Issues: Skin Integrity Issues:: Stage III Stage III: right heel and open area l/r buttocks  Last BM:  5/11  Height:   Ht Readings from Last 1 Encounters:  01/13/19 5\' 8"  (1.727 m)    Weight:   Wt Readings from Last 1 Encounters:  01/13/19 75.5 kg    Ideal Body Weight:  70 kg  BMI:  Body mass index is 25.31 kg/m.  Estimated Nutritional Needs:   Kcal:  [id number] ([id number])  Protein:  113-135 (1.5-1.8 gr/kg/adm wt)  Fluid:  per MD  Colman Cater MS,RD,CSG,LDN Office: [phone] Pager: [phone]

## 2019-01-13 NOTE — Progress Notes (Signed)
RT initiated wean protocol on patient at 0910 CPAP/PS 5/5 40%. Patient was tolerating well. RT suctioned throughout the day for moderate amount of secretions. RT increased PS to 14 for continuing wean to achieve volumes 450-51ml.  RT will continue to monitor and assess

## 2019-01-13 NOTE — Progress Notes (Signed)
CRITICAL VALUE ALERT  Critical Value: Potassium of 2.7  Date & Time Notied:  01/13/19 0930  Provider Notified: Debbe Odea, MD  Orders Received/Actions taken: See chart

## 2019-01-13 NOTE — Progress Notes (Signed)
Palliative:  John Munoz is more alert today and appears to be trying to talk and he is opening his eyes and looking at me, nodding his head (slightly, very weak), and wiggles toes when asked but again very very weak. I reassured him that he has been very sick in the hospital but we are taking good care of him and we hope to get him off vent as soon as possible and we have been speaking with his family and updating them. He nodded his head. Encouraged him to rest.   I spoke with John Munoz and updated her that he is more alert. I am pleased and she is ecstatic with this news. I did reiterate that his lungs are still poor and he has many health conditions and a long ways to go. We discussed hopes that he will be able to be successfully extubated but that I am not sure how well he will do for how long. John Munoz agreed that we should optimize him medically to give him best chance of successful extubation and proceed with extubation when the time is right per Dr. Luan Pulling. She agrees that we should NOT reintubate if he were to decline further. She asks that I tell him that "Binky" (that is what he calls her) loves him and misses him.   I did call Mr. Parekh sister, John Munoz, and she verifies that John Munoz cares for her brother and she trusts [employer]. She was actually on the phone with John Munoz who was updating her when I called. She was appreciative of the care provided to her brother and has no further questions or concerns. She agrees for [place] to continue discussions with John Munoz but says she does appreciate   Exam: More alert, following some commands. No distress or agitation.   Plan: - Extubate when medically optimized. No intentions to reintubate with decline. - Family still open to transition to comfort if no further signs of improvement.  - Unfortunately no palliative follow up available until 12/24/93.   30 min  Vinie Sill, NP Palliative Medicine Team Pager # [phone] (M-F  8a-5p) Team Phone # [phone] (Nights/Weekends)

## 2019-01-13 NOTE — Progress Notes (Signed)
Subjective: He remains intubated and on the ventilator.  He has known COPD.  He is on Precedex because he but had become agitated.  He has been intubated since admission he does have DNR status but family wants to continue with ventilator support etc. to see if he will get better he had trouble tolerating tube feeds yesterday and tube feeds have been held for now  Objective: Vital signs in last 24 hours: Temp:  [98 F (36.7 C)-99.3 F (37.4 C)] 99.3 F (37.4 C) (05/14 0724) Pulse Rate:  [65-96] 83 (05/14 0724) Resp:  [15-21] 16 (05/14 0724) BP: (127-155)/(70-117) 137/71 (05/14 0700) SpO2:  [89 %-100 %] 97 % (05/14 0809) FiO2 (%):  [40 %] 40 % (05/14 0809) Weight:  [75.5 kg] 75.5 kg (05/14 0500) Weight change: -4 kg Last BM Date: 01/10/19  Intake/Output from previous day: 05/13 0701 - 05/14 0700 In: 1814.2 [I.V.:485.5; IV Piggyback:1328.7] Out: 4900 [Urine:4750; Emesis/NG output:150]  PHYSICAL EXAM General appearance: Intubated sedated on mechanical ventilation Resp: He has bilateral rhonchi left slightly more than right Cardio: regular rate and rhythm, S1, S2 normal, no murmur, click, rub or gallop GI: soft, non-tender; bowel sounds normal; no masses,  no organomegaly Extremities: extremities normal, atraumatic, no cyanosis or edema  Lab Results:  Results for orders placed or performed during the hospital encounter of 01/09/19 (from the past 48 hour(s))  Glucose, capillary     Status: Abnormal   Collection Time: 01/11/19 11:23 AM  Result Value Ref Range   Glucose-Capillary 186 (H) 70 - 99 mg/dL  Glucose, capillary     Status: Abnormal   Collection Time: 01/11/19  3:53 PM  Result Value Ref Range   Glucose-Capillary 186 (H) 70 - 99 mg/dL   Comment 1 Notify RN    Comment 2 Document in Chart   Glucose, capillary     Status: Abnormal   Collection Time: 01/11/19  7:41 PM  Result Value Ref Range   Glucose-Capillary 166 (H) 70 - 99 mg/dL  Glucose, capillary     Status: Abnormal    Collection Time: 01/11/19 11:48 PM  Result Value Ref Range   Glucose-Capillary 191 (H) 70 - 99 mg/dL  Glucose, capillary     Status: Abnormal   Collection Time: 01/12/19  4:28 AM  Result Value Ref Range   Glucose-Capillary 169 (H) 70 - 99 mg/dL  Blood gas, arterial     Status: Abnormal   Collection Time: 01/12/19  5:00 AM  Result Value Ref Range   FIO2 40.00    Delivery systems VENTILATOR    Mode PRESSURE REGULATED VOLUME CONTROL    VT 540 mL   LHR 16 resp/min   Peep/cpap 5.0 cm H20   pH, Arterial 7.494 (H) 7.350 - 7.450   pCO2 arterial 35.8 32.0 - 48.0 mmHg   pO2, Arterial 87.6 83.0 - 108.0 mmHg   Bicarbonate 28.2 (H) 20.0 - 28.0 mmol/L   Acid-Base Excess 4.0 (H) 0.0 - 2.0 mmol/L   O2 Saturation 97.2 %   Patient temperature 37.0    Collection site LEFT RADIAL    Drawn by [id number]    Allens test (pass/fail) PASS PASS    Comment: Performed at [facility], [address] [place], [place] [address]  Procalcitonin     Status: None   Collection Time: 01/12/19  5:25 AM  Result Value Ref Range   Procalcitonin <0.10 ng/mL    Comment:        Interpretation: PCT (Procalcitonin) <= 0.5 ng/mL: Systemic  infection (sepsis) is not likely. Local bacterial infection is possible. (NOTE)       Sepsis PCT Algorithm           Lower Respiratory Tract                                      Infection PCT Algorithm    ----------------------------     ----------------------------         PCT < 0.25 ng/mL                PCT < 0.10 ng/mL         Strongly encourage             Strongly discourage   discontinuation of antibiotics    initiation of antibiotics    ----------------------------     -----------------------------       PCT 0.25 - 0.50 ng/mL            PCT 0.10 - 0.25 ng/mL               OR       >80% decrease in PCT            Discourage initiation of                                            antibiotics      Encourage discontinuation           of antibiotics     ----------------------------     -----------------------------         PCT >= 0.50 ng/mL              PCT 0.26 - 0.50 ng/mL               AND        <80% decrease in PCT             Encourage initiation of                                             antibiotics       Encourage continuation           of antibiotics    ----------------------------     -----------------------------        PCT >= 0.50 ng/mL                  PCT > 0.50 ng/mL               AND         increase in PCT                  Strongly encourage                                      initiation of antibiotics    Strongly encourage escalation           of antibiotics                                     -----------------------------  PCT <= 0.25 ng/mL                                                 OR                                        > 80% decrease in PCT                                     Discontinue / Do not initiate                                             antibiotics Performed at [facility], [address], [place], [place] [address]   Comprehensive metabolic panel     Status: Abnormal   Collection Time: 01/12/19  5:25 AM  Result Value Ref Range   Sodium 140 135 - 145 mmol/L   Potassium 3.0 (L) 3.5 - 5.1 mmol/L   Chloride 105 98 - 111 mmol/L   CO2 25 22 - 32 mmol/L   Glucose, Bld 186 (H) 70 - 99 mg/dL   BUN 22 8 - 23 mg/dL   Creatinine, Ser 0.59 (L) 0.61 - 1.24 mg/dL   Calcium 9.0 8.9 - 10.3 mg/dL   Total Protein 5.9 (L) 6.5 - 8.1 g/dL   Albumin 2.7 (L) 3.5 - 5.0 g/dL   AST 56 (H) 15 - 41 U/L   ALT 49 (H) 0 - 44 U/L   Alkaline Phosphatase 92 38 - 126 U/L   Total Bilirubin 0.7 0.3 - 1.2 mg/dL   GFR calc non Af Amer >60 >60 mL/min   GFR calc Af Amer >60 >60 mL/min   Anion gap 10 5 - 15    Comment: Performed at [facility], [address], [place], [place] [address]  CBC     Status: Abnormal   Collection Time: 01/12/19  5:25 AM  Result Value Ref Range    WBC 10.1 4.0 - 10.5 K/uL   RBC 3.47 (L) 4.22 - 5.81 MIL/uL   Hemoglobin 11.0 (L) 13.0 - 17.0 g/dL   HCT 32.7 (L) 39.0 - 52.0 %   MCV 94.2 80.0 - 100.0 fL   MCH 31.7 26.0 - 34.0 pg   MCHC 33.6 30.0 - 36.0 g/dL   RDW 17.7 (H) 11.5 - 15.5 %   Platelets 63 (L) 150 - 400 K/uL    Comment: Immature Platelet Fraction may be clinically indicated, consider ordering this additional test [id number]    nRBC 0.4 (H) 0.0 - 0.2 %    Comment: Performed at [facility], [address], [place], [place] [address]  Magnesium     Status: Abnormal   Collection Time: 01/12/19  5:25 AM  Result Value Ref Range   Magnesium 1.6 (L) 1.7 - 2.4 mg/dL    Comment: Performed at [facility], [address], [place], [place] [address]  Phosphorus     Status: Abnormal   Collection Time: 01/12/19  5:25 AM  Result Value Ref Range   Phosphorus 1.7 (L) 2.5 - 4.6 mg/dL  Comment: Performed at [facility], [address], [place], [place] [address]  Glucose, capillary     Status: Abnormal   Collection Time: 01/12/19  7:34 AM  Result Value Ref Range   Glucose-Capillary 170 (H) 70 - 99 mg/dL  Glucose, capillary     Status: Abnormal   Collection Time: 01/12/19 11:12 AM  Result Value Ref Range   Glucose-Capillary 147 (H) 70 - 99 mg/dL  Vancomycin, trough     Status: Abnormal   Collection Time: 01/12/19 11:39 AM  Result Value Ref Range   Vancomycin Tr 9 (L) 15 - 20 ug/mL    Comment: Performed at [facility], [address] [place], [place] [address]  Glucose, capillary     Status: Abnormal   Collection Time: 01/12/19  4:35 PM  Result Value Ref Range   Glucose-Capillary 137 (H) 70 - 99 mg/dL  Comprehensive metabolic panel     Status: Abnormal   Collection Time: 01/12/19  5:08 PM  Result Value Ref Range   Sodium 136 135 - 145 mmol/L   Potassium 2.8 (L) 3.5 - 5.1 mmol/L   Chloride 101 98 - 111 mmol/L   CO2 27 22 - 32 mmol/L   Glucose, Bld 129 (H) 70 - 99 mg/dL   BUN 19 8 - 23 mg/dL   Creatinine, Ser 0.52 (L) 0.61 -  1.24 mg/dL   Calcium 8.5 (L) 8.9 - 10.3 mg/dL   Total Protein 5.7 (L) 6.5 - 8.1 g/dL   Albumin 2.6 (L) 3.5 - 5.0 g/dL   AST 44 (H) 15 - 41 U/L   ALT 44 0 - 44 U/L   Alkaline Phosphatase 90 38 - 126 U/L   Total Bilirubin 1.0 0.3 - 1.2 mg/dL   GFR calc non Af Amer >60 >60 mL/min   GFR calc Af Amer >60 >60 mL/min   Anion gap 8 5 - 15    Comment: Performed at [facility], [address], [place], [place] [address]  Glucose, capillary     Status: Abnormal   Collection Time: 01/12/19  8:22 PM  Result Value Ref Range   Glucose-Capillary 147 (H) 70 - 99 mg/dL  Glucose, capillary     Status: Abnormal   Collection Time: 01/13/19 12:22 AM  Result Value Ref Range   Glucose-Capillary 124 (H) 70 - 99 mg/dL   Comment 1 Notify RN    Comment 2 Document in Chart   Blood gas, arterial     Status: Abnormal   Collection Time: 01/13/19  4:14 AM  Result Value Ref Range   FIO2 40.00    Delivery systems VENTILATOR    Mode PRESSURE REGULATED VOLUME CONTROL    VT 540 mL   LHR 16 resp/min   pH, Arterial 7.522 (H) 7.350 - 7.450   pCO2 arterial 38.3 32.0 - 48.0 mmHg   pO2, Arterial 90.0 83.0 - 108.0 mmHg   Bicarbonate 31.8 (H) 20.0 - 28.0 mmol/L   Acid-Base Excess 7.9 (H) 0.0 - 2.0 mmol/L   O2 Saturation 96.8 %   Patient temperature 37.0    Collection site LEFT RADIAL    Drawn by [id number]    Allens test (pass/fail) PASS PASS    Comment: Performed at [facility], [address], [place], [place] [address]  Glucose, capillary     Status: Abnormal   Collection Time: 01/13/19  4:31 AM  Result Value Ref Range   Glucose-Capillary 131 (H) 70 - 99 mg/dL   Comment 1 Notify RN  Comment 2 Document in Chart   CBC     Status: Abnormal   Collection Time: 01/13/19  4:58 AM  Result Value Ref Range   WBC 7.1 4.0 - 10.5 K/uL   RBC 3.24 (L) 4.22 - 5.81 MIL/uL   Hemoglobin 10.3 (L) 13.0 - 17.0 g/dL   HCT 30.2 (L) 39.0 - 52.0 %   MCV 93.2 80.0 - 100.0 fL   MCH 31.8 26.0 - 34.0 pg   MCHC 34.1 30.0 - 36.0 g/dL    RDW 17.1 (H) 11.5 - 15.5 %   Platelets 66 (L) 150 - 400 K/uL    Comment: SPECIMEN CHECKED FOR CLOTS Immature Platelet Fraction may be clinically indicated, consider ordering this additional test [id number] CONSISTENT WITH PREVIOUS RESULT    nRBC 0.0 0.0 - 0.2 %    Comment: Performed at [facility], [address], [place], [place] [address]  Magnesium     Status: Abnormal   Collection Time: 01/13/19  4:58 AM  Result Value Ref Range   Magnesium 1.6 (L) 1.7 - 2.4 mg/dL    Comment: Performed at [facility], [address], [place], [place] [address]  Phosphorus     Status: None   Collection Time: 01/13/19  4:58 AM  Result Value Ref Range   Phosphorus 3.2 2.5 - 4.6 mg/dL    Comment: Performed at [facility], [address], [place], [place] [address]  Glucose, capillary     Status: Abnormal   Collection Time: 01/13/19  7:26 AM  Result Value Ref Range   Glucose-Capillary 109 (H) 70 - 99 mg/dL    ABGS Recent Labs    01/13/19 0414  PHART 7.522*  PO2ART 90.0  HCO3 31.8*   CULTURES Recent Results (from the past 240 hour(s))  Blood Culture (routine x 2)     Status: None (Preliminary result)   Collection Time: 01/09/19 11:13 AM  Result Value Ref Range Status   Specimen Description BLOOD LEFT ARM BOTTLES DRAWN AEROBIC AND ANAEROBIC  Final   Special Requests Blood Culture adequate volume  Final   Culture   Final    NO GROWTH 3 DAYS Performed at [facility], [address] [place], [place] [address]    Report Status PENDING  Incomplete  Blood Culture (routine x 2)     Status: None (Preliminary result)   Collection Time: 01/09/19 11:35 AM  Result Value Ref Range Status   Specimen Description   Final    LEFT ANTECUBITAL BOTTLES DRAWN AEROBIC AND ANAEROBIC   Special Requests Blood Culture adequate volume  Final   Culture   Final    NO GROWTH 3 DAYS Performed at [facility], [address], [place], [place] [address]    Report Status PENDING  Incomplete  SARS Coronavirus 2  (CEPHEID - Performed in [facility] lab), Hosp Order     Status: None   Collection Time: 01/09/19  2:58 PM  Result Value Ref Range Status   SARS Coronavirus 2 NEGATIVE NEGATIVE Final    Comment: (NOTE) If result is NEGATIVE SARS-CoV-2 target nucleic acids are NOT DETECTED. The SARS-CoV-2 RNA is generally detectable in upper and lower  respiratory specimens during the acute phase of infection. The lowest  concentration of SARS-CoV-2 viral copies this assay can detect is 250  copies / mL. A negative result does not preclude SARS-CoV-2 infection  and should not be used as the sole basis for treatment or other  patient management decisions.  A negative result may  occur with  improper specimen collection / handling, submission of specimen other  than nasopharyngeal swab, presence of viral mutation(s) within the  areas targeted by this assay, and inadequate number of viral copies  (<250 copies / mL). A negative result must be combined with clinical  observations, patient history, and epidemiological information. If result is POSITIVE SARS-CoV-2 target nucleic acids are DETECTED. The SARS-CoV-2 RNA is generally detectable in upper and lower  respiratory specimens dur ing the acute phase of infection.  Positive  results are indicative of active infection with SARS-CoV-2.  Clinical  correlation with patient history and other diagnostic information is  necessary to determine patient infection status.  Positive results do  not rule out bacterial infection or co-infection with other viruses. If result is PRESUMPTIVE POSTIVE SARS-CoV-2 nucleic acids MAY BE PRESENT.   A presumptive positive result was obtained on the submitted specimen  and confirmed on repeat testing.  While 2019 novel coronavirus  (SARS-CoV-2) nucleic acids may be present in the submitted sample  additional confirmatory testing may be necessary for epidemiological  and / or clinical management purposes  to differentiate  between  SARS-CoV-2 and other Sarbecovirus currently known to infect humans.  If clinically indicated additional testing with an alternate test  methodology [phone]) is advised. The SARS-CoV-2 RNA is generally  detectable in upper and lower respiratory sp ecimens during the acute  phase of infection. The expected result is Negative. Fact Sheet for Patients:  StrictlyIdeas.no Fact Sheet for Healthcare Providers: BankingDealers.co.za This test is not yet approved or cleared by the [place] FDA and has been authorized for detection and/or diagnosis of SARS-CoV-2 by FDA under an Emergency Use Authorization (EUA).  This EUA will remain in effect (meaning this test can be used) for the duration of the COVID-19 declaration under Section 564(b)(1) of the Act, 21 U.S.C. section 360bbb-3(b)(1), unless the authorization is terminated or revoked sooner. Performed at [facility], [address], [place], [place] [address]   MRSA PCR Screening     Status: Abnormal   Collection Time: 01/09/19  8:47 PM  Result Value Ref Range Status   MRSA by PCR POSITIVE (A) NEGATIVE Final    Comment:        The GeneXpert MRSA Assay (FDA approved for NASAL specimens only), is one component of a comprehensive MRSA colonization surveillance program. It is not intended to diagnose MRSA infection nor to guide or monitor treatment for MRSA infections. RESULT CALLED TO, READ BACK BY AND VERIFIED WITH: TETREAULT,H @ 0007 ON 01/10/19 BY JUW Performed at [facility], [address], [place], [place] [address]   Culture, respiratory     Status: None   Collection Time: 01/09/19 11:39 PM  Result Value Ref Range Status   Specimen Description   Final    TRACHEAL ASPIRATE Performed at [facility], [address] [place], [place] [address]    Special Requests   Final    NONE Performed at [facility], [address] [place], [place] [address]    Gram Stain   Final     MODERATE WBC PRESENT, PREDOMINANTLY PMN RARE GRAM POSITIVE COCCI RARE YEAST Performed at [facility], [place] [address], [place],  [address]    Culture   Final    MODERATE METHICILLIN RESISTANT STAPHYLOCOCCUS AUREUS   Report Status 01/12/2019 FINAL  Final   Organism ID, Bacteria METHICILLIN RESISTANT STAPHYLOCOCCUS AUREUS  Final      Susceptibility   Methicillin resistant staphylococcus aureus - MIC*  CIPROFLOXACIN >=8 RESISTANT Resistant     ERYTHROMYCIN >=8 RESISTANT Resistant     GENTAMICIN <=0.5 SENSITIVE Sensitive     OXACILLIN >=4 RESISTANT Resistant     TETRACYCLINE <=1 SENSITIVE Sensitive     VANCOMYCIN 1 SENSITIVE Sensitive     TRIMETH/SULFA <=10 SENSITIVE Sensitive     CLINDAMYCIN <=0.25 SENSITIVE Sensitive     RIFAMPIN <=0.5 SENSITIVE Sensitive     Inducible Clindamycin NEGATIVE Sensitive     * MODERATE METHICILLIN RESISTANT STAPHYLOCOCCUS AUREUS   Studies/Results: Dg Chest Port 1 View  Result Date: 01/12/2019 CLINICAL DATA:  85 year old male with a history PICC placement EXAM: PORTABLE CHEST 1 VIEW COMPARISON:  01/12/2019 FINDINGS: Interval placement of right upper extremity PICC which appears to terminate near the superior cavoatrial junction. Cardiomediastinal silhouette likely unchanged in size and contour. Heart borders partially obscured by lung/pleural disease. Persistently low lung volumes with mixed interstitial and airspace opacities most prominent at the lung bases. Obscuration left hemidiaphragm in the left heart border persists. No change in aeration. No pneumothorax. Endotracheal tube terminates 4.8 cm above the carina. Gastric tube terminates in the left upper quadrant. IMPRESSION: Interval placement of right upper extremity PICC which appears to terminate superior cavoatrial junction. Similar appearance of mixed interstitial and airspace disease with likely left greater than right pleural effusion and associated atelectasis/consolidation.  Unchanged endotracheal tube and gastric tube. Electronically Signed   By: Corrie Mckusick D.O.   On: 01/12/2019 17:01   Dg Chest Port 1 View  Result Date: 01/12/2019 CLINICAL DATA:  85 year old male NG tube placement. EXAM: PORTABLE CHEST 1 VIEW COMPARISON:  0046 hours today and earlier. FINDINGS: Portable AP semi upright view at 0810 hours. Enteric tube in place and courses to the abdomen. Tip is not included, but the side hole might be visible near the GE junction. Stable lung volumes and ventilation. Stable cardiac size and mediastinal contours. Endotracheal tube tip is not as well visualized but likely stable at the level the clavicles. IMPRESSION: 1. Enteric tube courses to the abdomen. Tip is not included, but the side hole might be visible near the GE junction. Consider advancing 5 cm to ensure side hole placement within the stomach. 2. Otherwise stable chest. Electronically Signed   By: Genevie Ann M.D.   On: 01/12/2019 08:26   Dg Chest Port 1 View  Result Date: 01/12/2019 CLINICAL DATA:  85 year old male with respiratory failure. EXAM: PORTABLE CHEST 1 VIEW COMPARISON:  Chest radiograph dated 01/11/2019 FINDINGS: Evaluation is limited due to patient's positioning and superimposition of the mandible over the upper mediastinum. The tip of the endotracheal tube is poorly visualized but appears in similar position as before. An enteric tube extends into the left upper abdomen with side-port in the region of the gastroesophageal junction. Recommend further advancing the tube into the stomach by at least additional 5 7 cm. Cardiomegaly with vascular congestion and edema. Left lung base density. Overall slight interval improvement in the aeration of the left lung compared to the prior radiograph. No pneumothorax. No acute osseous pathology. IMPRESSION: 1. Slight interval improvement in the aeration of the left lung compared to the prior radiograph. 2. Cardiomegaly with vascular congestion and edema. 3. Left lung  base density, not significantly changed. 4. Lines and tubes as described above. The enteric tube can be advanced for optimal positioning. Electronically Signed   By: Anner Crete M.D.   On: 01/12/2019 01:24   Dg Chest Port 1 View  Result Date: 01/11/2019 CLINICAL DATA:  Shortness  of breath.  Respiratory failure. EXAM: PORTABLE CHEST 1 VIEW COMPARISON:  01/10/2019 FINDINGS: The endotracheal tube terminates above the carina by approximately 4.5 cm. The enteric tube it peers to extend below the left hemidiaphragm. The cardiac silhouette is enlarged. Aortic calcifications are noted. There is a left basilar/retrocardiac opacity, similar to prior study. No pneumothorax. Prominent interstitial lung markings are again noted IMPRESSION: 1. Lines and tubes as above. 2. Persistent left basilar airspace opacity, not significantly improved from prior study. Electronically Signed   By: Constance Holster M.D.   On: 01/11/2019 13:51   [place] Ekg Site Rite  Result Date: 01/12/2019 If Site Rite image not attached, placement could not be confirmed due to current cardiac rhythm.   Medications:  Prior to Admission:  Medications Prior to Admission  Medication Sig Dispense Refill Last Dose  . acetaminophen (TYLENOL) 500 MG tablet Take 1,000 mg by mouth every 6 (six) hours as needed. Pain   Past Week at Unknown  . allopurinol (ZYLOPRIM) 300 MG tablet Take 300 mg by mouth daily.   01/08/2019 at Unknown time  . aspirin 81 MG chewable tablet Chew 81 mg by mouth daily.   01/08/2019 at 0930  . busPIRone (BUSPAR) 15 MG tablet Take 15 mg by mouth 2 (two) times daily.   01/08/2019 at Unknown time  . calcium gluconate 500 MG tablet Take 1 tablet by mouth daily.   01/08/2019 at Unknown time  . citalopram (CELEXA) 20 MG tablet Take 20 mg by mouth daily.   01/08/2019 at Unknown time  . folic acid (FOLVITE) 1 MG tablet Take 1 mg by mouth daily.   01/08/2019 at Unknown time  . gabapentin (NEURONTIN) 300 MG capsule Take 300 mg by mouth 3  (three) times daily.   01/08/2019 at Unknown time  . Melatonin 5 MG TABS Take 1 tablet by mouth at bedtime.   01/08/2019 at Unknown time  . midodrine (PROAMATINE) 2.5 MG tablet Take 2.5 mg by mouth 2 (two) times a day.   01/08/2019 at Unknown time  . Omega-3 Fatty Acids (FISH OIL) 1000 MG CAPS Take 1 capsule by mouth 2 (two) times a day.   01/08/2019 at Unknown time  . omeprazole (PRILOSEC) 20 MG capsule Take 20 mg by mouth daily.   01/08/2019 at Unknown time  . Tamsulosin HCl (FLOMAX) 0.4 MG CAPS Take 0.4 mg by mouth daily.    01/08/2019 at Unknown time  . traMADol (ULTRAM) 50 MG tablet Take 50 mg by mouth 3 (three) times daily.   01/08/2019 at Unknown time  . vitamin B-12 (CYANOCOBALAMIN) 1000 MCG tablet Take 1,000 mcg by mouth daily.   01/08/2019 at Unknown time  . ciprofloxacin (CIPRO) 500 MG tablet Take 500 mg by mouth 2 (two) times daily.   Completed Course at Unknown time   Scheduled: . albuterol  2.5 mg Nebulization Q4H  . chlorhexidine gluconate (MEDLINE KIT)  15 mL Mouth Rinse BID  . Chlorhexidine Gluconate Cloth  6 each Topical Q0600  . Chlorhexidine Gluconate Cloth  6 each Topical Daily  . collagenase   Topical Daily  . hydrocortisone sod succinate (SOLU-CORTEF) inj  25 mg Intravenous Q12H  . mouth rinse  15 mL Mouth Rinse 10 times per day  . mupirocin ointment  1 application Nasal BID  . sodium chloride flush  10-40 mL Intracatheter Q12H   Continuous: . sodium chloride Stopped (01/12/19 1717)  . dexmedetomidine (PRECEDEX) IV infusion 0.8 mcg/kg/hr (01/13/19 1007)  . famotidine (PEPCID) IV 20 mg (01/13/19  8830)  . norepinephrine (LEVOPHED) Adult infusion Stopped (01/10/19 1633)  . vancomycin     XEX:PFRHZJ chloride, acetaminophen, fentaNYL (SUBLIMAZE) injection, fentaNYL (SUBLIMAZE) injection, hydrALAZINE, ipratropium-albuterol, midazolam, midazolam, ondansetron (ZOFRAN) IV, sodium chloride flush  Assesment: He was admitted with altered mental status and lethargy.  He was found to have  healthcare associated pneumonia which is multilobar.  He was septic and briefly required pressor support but he is off pressors now.  He was intubated in the emergency department and he remains on the ventilator.  He became agitated and his sedation was switched to Precedex and the 72-hour limit will expire tomorrow  He has significant COPD at baseline which is being treated  He has had trouble with right heel pressure ulcer and had cellulitis from that and this looks no different now.  He is having trouble tolerating tube feeds and they have been held for now  He has had trouble with abnormal liver function  He has had low platelets which continue and he is mildly anemic   Principal Problem:   HCAP (healthcare-associated pneumonia) Active Problems:   Sepsis with acute hypoxic respiratory failure (HCC)   Hypotension   Abnormal liver function   Pressure injury of skin   Sepsis due to undetermined organism ([place])   Acute respiratory failure with hypoxia (HCC)   Thrombocytopenia (HCC)   Acute CHF (congestive heart failure) (HCC)   Altered mental status   Acute metabolic encephalopathy   Goals of care, counseling/discussion   Palliative care encounter   Pancytopenia ([place])   Pneumonia of left lower lobe due to infectious organism [facility])    Plan: See if we can drop his Precedex some and see what he can do with weaning process.  Ventilator settings and blood gas are at a point that he could be weaned but will have to see how he does clinically.    LOS: 4 days   John Munoz 01/13/2019, 8:16 AM

## 2019-01-13 NOTE — Progress Notes (Signed)
PROGRESS NOTE    John Munoz   KGU:[id number]  DOB: 1932-09-18  DOA: 01/09/2019 PCP: Neale Burly, MD   Brief Narrative:  John Munoz is a 85 y.o. male from home with great neice with medical history significant for COPD, depression, BPH, recent right heel pressure ulcer with cellulitis and necrosis, recent UTI, brought to the ED with reports of altered mental status, lethargic of 2 days duration.  In ED, temp 91.1, tachypnea, CT chest> left > right lower lobe infiltrates. Started on  Vanc and Cefepime for HCAP and intubated in ED. COVID neg. MRSA screen +.  Admitted for HCAP, acute diastolic CHF, acute encephalopathy. Made DNR the following day. Venous duplex 01/10/19- no DVT   Subjective: Intubated.   Assessment & Plan:   Principal Problem:     Sepsis- hypothermia, hypotension   Acute respiratory failure with hypoxia due to HCAP and acute diastolic CHF - remains intubated and on Vanc, Flagyl and Cefepime-  - he has been weaned off of Levophed - begin to wean stress dose steroids for now - given 2 doses of IV Lasix yesterday for pulmonary edema- will hold off on further Lasix and will hold off on fluids.   Active Problems:  Vomiting tube feeds - RN has tried to advance the tube as CXR suspected it was in his esophagus- despite this, he continues to vomit- he is a high aspiration risk and with repeated aspirations, a high risk for death despite being intubated- - Tube feeds held yesterday - will resume tube feed at 30 cc/hr today and follow for vomiting  Electrolyte abnormalities - low K, Mag and phos- will continue to replace and follow     Abnormal liver function/ thrombocytopenia - suspected to be due to sepsis- following    Pressure injury of skin- stage 3 left heel - appreciate wound care eval - recommendations> Enzymatic debridement and saline moist gauze. Cover with dry dressing. Change daily.    Time spent in minutes: 35 min DVT prophylaxis: SCDs  Code Status: DNR- family will consider transitioning to comfort if condition does not improve in next few days Family Communication:  Disposition Plan: follow in ICU Consultants:   pulmonary Procedures:  2 D ECHO 1. The left ventricle has normal systolic function, with an ejection fraction of 55-60%. The cavity size was normal. There is mild concentric left ventricular hypertrophy. Left ventricular diastolic Doppler parameters are indeterminate. No evidence of  left ventricular regional wall motion abnormalities.  2. The right ventricle has normal systolic function. The cavity was normal. There is no increase in right ventricular wall thickness.  3. Left atrial size was mildly dilated.  4. The mitral valve is grossly normal. Mild thickening of the mitral valve leaflet. There is mild to moderate mitral annular calcification present.  5. The tricuspid valve is grossly normal.  6. The aortic valve is tricuspid. Mild thickening of the aortic valve. Mild calcification of the aortic valve. Aortic valve regurgitation is trivial by color flow Doppler.  7. Moderate aortic annular calcification.  8. The aortic root is normal in size and structure.  9. The inferior vena cava was dilated in size with <50% respiratory variability.  Antimicrobials:  Anti-infectives (From admission, onward)   Start     Dose/Rate Route Frequency Ordered Stop   01/13/19 1300  vancomycin (VANCOCIN) 1,750 mg in sodium chloride 0.9 % 500 mL IVPB     1,750 mg 250 mL/hr over 120 Minutes Intravenous Every 24 hours 01/12/19 1306  01/10/19 1230  vancomycin (VANCOCIN) 1,500 mg in sodium chloride 0.9 % 500 mL IVPB  Status:  Discontinued     1,500 mg 250 mL/hr over 120 Minutes Intravenous Every 24 hours 01/09/19 1230 01/10/19 0759   01/10/19 1230  vancomycin (VANCOCIN) 1,500 mg in sodium chloride 0.9 % 500 mL IVPB     1,500 mg 250 mL/hr over 120 Minutes Intravenous Every 24 hours 01/10/19 0800 01/12/19 1622   01/09/19 2000   ceFEPIme (MAXIPIME) 2 g in sodium chloride 0.9 % 100 mL IVPB     2 g 200 mL/hr over 30 Minutes Intravenous Every 8 hours 01/09/19 1219 01/11/19 2211   01/09/19 1330  vancomycin (VANCOCIN) 500 mg in sodium chloride 0.9 % 100 mL IVPB     500 mg 100 mL/hr over 60 Minutes Intravenous  Once 01/09/19 1221 01/09/19 1502   01/09/19 1115  ceFEPIme (MAXIPIME) 2 g in sodium chloride 0.9 % 100 mL IVPB     2 g 200 mL/hr over 30 Minutes Intravenous  Once 01/09/19 1112 01/09/19 1214   01/09/19 1115  metroNIDAZOLE (FLAGYL) IVPB 500 mg     500 mg 100 mL/hr over 60 Minutes Intravenous  Once 01/09/19 1112 01/09/19 1239   01/09/19 1115  vancomycin (VANCOCIN) IVPB 1000 mg/200 mL premix     1,000 mg 200 mL/hr over 60 Minutes Intravenous  Once 01/09/19 1112 01/09/19 1336       Objective: Vitals:   01/13/19 0809 01/13/19 0900 01/13/19 1000 01/13/19 1027  BP:  134/72 (!) 142/78   Pulse:  86 89   Resp:  17 19   Temp:      TempSrc:      SpO2: 97% 98% 97% 96%  Weight:      Height:  '5\' 8"'  (1.727 m)      Intake/Output Summary (Last 24 hours) at 01/13/2019 1046 Last data filed at 01/13/2019 6213 Gross per 24 hour  Intake 1877.5 ml  Output 4600 ml  Net -2722.5 ml   Filed Weights   01/11/19 0500 01/11/19 1125 01/13/19 0500  Weight: 80.1 kg 79.5 kg 75.5 kg    Examination: General exam: Appears comfortable - intubated HEENT: PERRL, oral mucosa moist, no sclera icterus or thrush Respiratory system: Clear to auscultation. Respiratory effort normal. Cardiovascular system: S1 & S2 heard,  No murmurs  Gastrointestinal system: Abdomen soft, non-tender, nondistended. Normal bowel sounds   Extremities: No cyanosis, clubbing or edema    Data Reviewed: I have personally reviewed following labs and imaging studies  CBC: Recent Labs  Lab 01/09/19 1113 01/10/19 0413 01/10/19 0546 01/10/19 2003 01/11/19 0419 01/12/19 0525 01/13/19 0458  WBC 3.7* 4.5  --   --  3.6* 10.1 7.1  NEUTROABS 2.9  --   --   --    --   --   --   HGB 8.0* 7.2*  --  9.5* 9.9* 11.0* 10.3*  HCT 23.7* 21.2* 20.2* 29.1* 29.9* 32.7* 30.2*  MCV 100.0 99.5  --   --  94.9 94.2 93.2  PLT 101* 84*  --   --  65* 63* 66*   Basic Metabolic Panel: Recent Labs  Lab 01/10/19 0413 01/11/19 0419 01/12/19 0525 01/12/19 1708 01/13/19 0458 01/13/19 0859  NA 138 140 140 136  --  138  K 3.8 3.3* 3.0* 2.8*  --  2.7*  CL 103 108 105 101  --  97*  CO2 '26 23 25 27  ' --  29  GLUCOSE 69* 156* 186* 129*  --  120*  BUN 26* '20 22 19  ' --  17  CREATININE 0.88 0.72 0.59* 0.52*  --  0.60*  CALCIUM 8.4* 8.5* 9.0 8.5*  --  8.5*  MG  --   --  1.6*  --  1.6* 1.6*  PHOS  --   --  1.7*  --  3.2 3.4   GFR: Estimated Creatinine Clearance: 67.7 mL/min (A) (by C-G formula based on SCr of 0.6 mg/dL (L)). Liver Function Tests: Recent Labs  Lab 01/09/19 1113 01/10/19 0413 01/11/19 0419 01/12/19 0525 01/12/19 1708  AST 63* 72* 87* 56* 44*  ALT 49* 47* 53* 49* 44  ALKPHOS 106 95 99 92 90  BILITOT 0.5 0.5 1.0 0.7 1.0  PROT 6.0* 5.4* 5.9* 5.9* 5.7*  ALBUMIN 3.0* 2.7* 2.9* 2.7* 2.6*   No results for input(s): LIPASE, AMYLASE in the last 168 hours. No results for input(s): AMMONIA in the last 168 hours. Coagulation Profile: Recent Labs  Lab 01/10/19 0935  INR 1.1   Cardiac Enzymes: Recent Labs  Lab 01/09/19 1516 01/10/19 0413 01/10/19 0546 01/10/19 0935 01/10/19 1516 01/10/19 2003  CKTOTAL 551*  --   --   --   --   --   TROPONINI <0.03 0.46* 0.54* 0.78* 0.89* 0.93*   BNP (last 3 results) No results for input(s): PROBNP in the last 8760 hours. HbA1C: No results for input(s): HGBA1C in the last 72 hours. CBG: Recent Labs  Lab 01/12/19 1635 01/12/19 2022 01/13/19 0022 01/13/19 0431 01/13/19 0726  GLUCAP 137* 147* 124* 131* 109*   Lipid Profile: No results for input(s): CHOL, HDL, LDLCALC, TRIG, CHOLHDL, LDLDIRECT in the last 72 hours. Thyroid Function Tests: No results for input(s): TSH, T4TOTAL, FREET4, T3FREE, THYROIDAB  in the last 72 hours. Anemia Panel: No results for input(s): VITAMINB12, FOLATE, FERRITIN, TIBC, IRON, RETICCTPCT in the last 72 hours. Urine analysis:    Component Value Date/Time   COLORURINE YELLOW 01/09/2019 1114   APPEARANCEUR CLEAR 01/09/2019 1114   LABSPEC 1.017 01/09/2019 1114   PHURINE 5.0 01/09/2019 1114   GLUCOSEU NEGATIVE 01/09/2019 1114   [place] 01/09/2019 1114   [place] 01/09/2019 [place] 01/09/2019 [place] 01/09/2019 1114   NITRITE NEGATIVE 01/09/2019 1114   LEUKOCYTESUR TRACE (A) 01/09/2019 1114   Sepsis Labs: '@LABRCNTIP' (procalcitonin:4,lacticidven:4) ) Recent Results (from the past 240 hour(s))  Blood Culture (routine x 2)     Status: None (Preliminary result)   Collection Time: 01/09/19 11:13 AM  Result Value Ref Range Status   Specimen Description BLOOD LEFT ARM BOTTLES DRAWN AEROBIC AND ANAEROBIC  Final   Special Requests Blood Culture adequate volume  Final   Culture   Final    NO GROWTH 4 DAYS Performed at [facility], [address] [place], [place] [address]    Report Status PENDING  Incomplete  Blood Culture (routine x 2)     Status: None (Preliminary result)   Collection Time: 01/09/19 11:35 AM  Result Value Ref Range Status   Specimen Description   Final    LEFT ANTECUBITAL BOTTLES DRAWN AEROBIC AND ANAEROBIC   Special Requests Blood Culture adequate volume  Final   Culture   Final    NO GROWTH 4 DAYS Performed at [facility], [address], [place], [place] [address]    Report Status PENDING  Incomplete  SARS Coronavirus 2 (CEPHEID - Performed in [place] hospital lab), Hosp Order     Status: None   Collection  Time: 01/09/19  2:58 PM  Result Value Ref Range Status   SARS Coronavirus 2 NEGATIVE NEGATIVE Final    Comment: (NOTE) If result is NEGATIVE SARS-CoV-2 target nucleic acids are NOT DETECTED. The SARS-CoV-2 RNA is generally detectable in upper and lower  respiratory  specimens during the acute phase of infection. The lowest  concentration of SARS-CoV-2 viral copies this assay can detect is 250  copies / mL. A negative result does not preclude SARS-CoV-2 infection  and should not be used as the sole basis for treatment or other  patient management decisions.  A negative result may occur with  improper specimen collection / handling, submission of specimen other  than nasopharyngeal swab, presence of viral mutation(s) within the  areas targeted by this assay, and inadequate number of viral copies  (<250 copies / mL). A negative result must be combined with clinical  observations, patient history, and epidemiological information. If result is POSITIVE SARS-CoV-2 target nucleic acids are DETECTED. The SARS-CoV-2 RNA is generally detectable in upper and lower  respiratory specimens dur ing the acute phase of infection.  Positive  results are indicative of active infection with SARS-CoV-2.  Clinical  correlation with patient history and other diagnostic information is  necessary to determine patient infection status.  Positive results do  not rule out bacterial infection or co-infection with other viruses. If result is PRESUMPTIVE POSTIVE SARS-CoV-2 nucleic acids MAY BE PRESENT.   A presumptive positive result was obtained on the submitted specimen  and confirmed on repeat testing.  While 2019 novel coronavirus  (SARS-CoV-2) nucleic acids may be present in the submitted sample  additional confirmatory testing may be necessary for epidemiological  and / or clinical management purposes  to differentiate between  SARS-CoV-2 and other Sarbecovirus currently known to infect humans.  If clinically indicated additional testing with an alternate test  methodology [phone]) is advised. The SARS-CoV-2 RNA is generally  detectable in upper and lower respiratory sp ecimens during the acute  phase of infection. The expected result is Negative. Fact Sheet for  Patients:  StrictlyIdeas.no Fact Sheet for Healthcare Providers: BankingDealers.co.za This test is not yet approved or cleared by the [place] FDA and has been authorized for detection and/or diagnosis of SARS-CoV-2 by FDA under an Emergency Use Authorization (EUA).  This EUA will remain in effect (meaning this test can be used) for the duration of the COVID-19 declaration under Section 564(b)(1) of the Act, 21 U.S.C. section 360bbb-3(b)(1), unless the authorization is terminated or revoked sooner. Performed at [facility], [address], [place], [place] [address]   MRSA PCR Screening     Status: Abnormal   Collection Time: 01/09/19  8:47 PM  Result Value Ref Range Status   MRSA by PCR POSITIVE (A) NEGATIVE Final    Comment:        The GeneXpert MRSA Assay (FDA approved for NASAL specimens only), is one component of a comprehensive MRSA colonization surveillance program. It is not intended to diagnose MRSA infection nor to guide or monitor treatment for MRSA infections. RESULT CALLED TO, READ BACK BY AND VERIFIED WITH: TETREAULT,H @ 0007 ON 01/10/19 BY JUW Performed at [facility], [address], [place],  [address]   Culture, respiratory     Status: None   Collection Time: 01/09/19 11:39 PM  Result Value Ref Range Status   Specimen Description   Final    TRACHEAL ASPIRATE Performed at [facility], [address] [place],  [address]  Special Requests   Final    NONE Performed at [facility], [address], [place], [place] [address]    Gram Stain   Final    MODERATE WBC PRESENT, PREDOMINANTLY PMN RARE GRAM POSITIVE COCCI RARE YEAST Performed at [facility], [place] [address], [place], [place] [address]    Culture   Final    MODERATE METHICILLIN RESISTANT STAPHYLOCOCCUS AUREUS   Report Status 01/12/2019 FINAL  Final   Organism ID, Bacteria METHICILLIN RESISTANT STAPHYLOCOCCUS AUREUS  Final       Susceptibility   Methicillin resistant staphylococcus aureus - MIC*    CIPROFLOXACIN >=8 RESISTANT Resistant     ERYTHROMYCIN >=8 RESISTANT Resistant     GENTAMICIN <=0.5 SENSITIVE Sensitive     OXACILLIN >=4 RESISTANT Resistant     TETRACYCLINE <=1 SENSITIVE Sensitive     VANCOMYCIN 1 SENSITIVE Sensitive     TRIMETH/SULFA <=10 SENSITIVE Sensitive     CLINDAMYCIN <=0.25 SENSITIVE Sensitive     RIFAMPIN <=0.5 SENSITIVE Sensitive     Inducible Clindamycin NEGATIVE Sensitive     * MODERATE METHICILLIN RESISTANT STAPHYLOCOCCUS AUREUS         Radiology Studies: Dg Chest Port 1 View  Result Date: 01/13/2019 CLINICAL DATA:  85 year old male with respiratory failure, smoker. Negative for COVID-19 on 01/09/2019. EXAM: PORTABLE CHEST 1 VIEW COMPARISON:  01/12/2019 and earlier. FINDINGS: Portable AP semi upright view at at 0525 hours. Endotracheal tube tip between the level the clavicles and carina. Stable visible enteric tube and right PICC line. Stable lung volumes and ventilation with continued dense left lung base opacity obscuring the diaphragm and patchy/confluent multifocal right lower lung opacity. Stable increased interstitial markings in both lungs which might reflect vascular congestion on chronic lung disease. Stable cardiac size and mediastinal contours. Paucity of bowel gas in the upper abdomen. IMPRESSION: 1. Stable lines and tubes. 2. Stable ventilation with continued dense left lower lobe collapse or consolidation and patchy right lung base opacity. 3. Suspect pulmonary vascular congestion superimposed on chronic interstitial lung disease. Electronically Signed   By: Genevie Ann M.D.   On: 01/13/2019 08:39   Dg Chest Port 1 View  Result Date: 01/12/2019 CLINICAL DATA:  85 year old male with a history PICC placement EXAM: PORTABLE CHEST 1 VIEW COMPARISON:  01/12/2019 FINDINGS: Interval placement of right upper extremity PICC which appears to terminate near the superior cavoatrial  junction. Cardiomediastinal silhouette likely unchanged in size and contour. Heart borders partially obscured by lung/pleural disease. Persistently low lung volumes with mixed interstitial and airspace opacities most prominent at the lung bases. Obscuration left hemidiaphragm in the left heart border persists. No change in aeration. No pneumothorax. Endotracheal tube terminates 4.8 cm above the carina. Gastric tube terminates in the left upper quadrant. IMPRESSION: Interval placement of right upper extremity PICC which appears to terminate superior cavoatrial junction. Similar appearance of mixed interstitial and airspace disease with likely left greater than right pleural effusion and associated atelectasis/consolidation. Unchanged endotracheal tube and gastric tube. Electronically Signed   By: Corrie Mckusick D.O.   On: 01/12/2019 17:01   Dg Chest Port 1 View  Result Date: 01/12/2019 CLINICAL DATA:  85 year old male NG tube placement. EXAM: PORTABLE CHEST 1 VIEW COMPARISON:  0046 hours today and earlier. FINDINGS: Portable AP semi upright view at 0810 hours. Enteric tube in place and courses to the abdomen. Tip is not included, but the side hole might be visible near the GE junction. Stable lung volumes and ventilation. Stable cardiac size  and mediastinal contours. Endotracheal tube tip is not as well visualized but likely stable at the level the clavicles. IMPRESSION: 1. Enteric tube courses to the abdomen. Tip is not included, but the side hole might be visible near the GE junction. Consider advancing 5 cm to ensure side hole placement within the stomach. 2. Otherwise stable chest. Electronically Signed   By: Genevie Ann M.D.   On: 01/12/2019 08:26   Dg Chest Port 1 View  Result Date: 01/12/2019 CLINICAL DATA:  85 year old male with respiratory failure. EXAM: PORTABLE CHEST 1 VIEW COMPARISON:  Chest radiograph dated 01/11/2019 FINDINGS: Evaluation is limited due to patient's positioning and superimposition of  the mandible over the upper mediastinum. The tip of the endotracheal tube is poorly visualized but appears in similar position as before. An enteric tube extends into the left upper abdomen with side-port in the region of the gastroesophageal junction. Recommend further advancing the tube into the stomach by at least additional 5 7 cm. Cardiomegaly with vascular congestion and edema. Left lung base density. Overall slight interval improvement in the aeration of the left lung compared to the prior radiograph. No pneumothorax. No acute osseous pathology. IMPRESSION: 1. Slight interval improvement in the aeration of the left lung compared to the prior radiograph. 2. Cardiomegaly with vascular congestion and edema. 3. Left lung base density, not significantly changed. 4. Lines and tubes as described above. The enteric tube can be advanced for optimal positioning. Electronically Signed   By: Anner Crete M.D.   On: 01/12/2019 01:24   Dg Chest Port 1 View  Result Date: 01/11/2019 CLINICAL DATA:  Shortness of breath.  Respiratory failure. EXAM: PORTABLE CHEST 1 VIEW COMPARISON:  01/10/2019 FINDINGS: The endotracheal tube terminates above the carina by approximately 4.5 cm. The enteric tube it peers to extend below the left hemidiaphragm. The cardiac silhouette is enlarged. Aortic calcifications are noted. There is a left basilar/retrocardiac opacity, similar to prior study. No pneumothorax. Prominent interstitial lung markings are again noted IMPRESSION: 1. Lines and tubes as above. 2. Persistent left basilar airspace opacity, not significantly improved from prior study. Electronically Signed   By: Constance Holster M.D.   On: 01/11/2019 13:51   [place] Ekg Site Rite  Result Date: 01/12/2019 If Site Rite image not attached, placement could not be confirmed due to current cardiac rhythm.     Scheduled Meds: . albuterol  2.5 mg Nebulization Q4H  . chlorhexidine gluconate (MEDLINE KIT)  15 mL Mouth Rinse BID   . Chlorhexidine Gluconate Cloth  6 each Topical Q0600  . Chlorhexidine Gluconate Cloth  6 each Topical Daily  . collagenase   Topical Daily  . feeding supplement (VITAL HIGH PROTEIN)  1,000 mL Per Tube Q24H  . hydrocortisone sod succinate (SOLU-CORTEF) inj  25 mg Intravenous Q12H  . mouth rinse  15 mL Mouth Rinse 10 times per day  . mupirocin ointment  1 application Nasal BID  . potassium chloride  40 mEq Oral Q4H  . sodium chloride flush  10-40 mL Intracatheter Q12H   Continuous Infusions: . sodium chloride Stopped (01/12/19 1717)  . dexmedetomidine (PRECEDEX) IV infusion 0.8 mcg/kg/hr (01/13/19 5188)  . famotidine (PEPCID) IV 20 mg (01/13/19 0910)  . magnesium sulfate bolus IVPB 4 g (01/13/19 0923)  . norepinephrine (LEVOPHED) Adult infusion Stopped (01/10/19 1633)  . vancomycin       LOS: 4 days      Debbe Odea, MD [facility] Pager: [email or web address] Password [facility] 01/13/2019, 10:46 AM

## 2019-01-14 LAB — BASIC METABOLIC PANEL
Anion gap: 8 (ref 5–15)
Anion gap: 8 (ref 5–15)
BUN: 23 mg/dL (ref 8–23)
BUN: 23 mg/dL (ref 8–23)
CO2: 30 mmol/L (ref 22–32)
CO2: 30 mmol/L (ref 22–32)
Calcium: 8.3 mg/dL — ABNORMAL LOW (ref 8.9–10.3)
Calcium: 8.3 mg/dL — ABNORMAL LOW (ref 8.9–10.3)
Chloride: 99 mmol/L (ref 98–111)
Chloride: 100 mmol/L (ref 98–111)
Creatinine, Ser: 0.49 mg/dL — ABNORMAL LOW (ref 0.61–1.24)
Creatinine, Ser: 0.51 mg/dL — ABNORMAL LOW (ref 0.61–1.24)
GFR calc Af Amer: >60 mL/min (ref >60)
GFR calc Af Amer: >60 mL/min (ref >60)
GFR calc non Af Amer: >60 mL/min (ref >60)
GFR calc non Af Amer: >60 mL/min (ref >60)
Glucose, Bld: 125 mg/dL — ABNORMAL HIGH (ref 70–99)
Glucose, Bld: 132 mg/dL — ABNORMAL HIGH (ref 70–99)
Potassium: 3.2 mmol/L — ABNORMAL LOW (ref 3.5–5.1)
Potassium: 3.4 mmol/L — ABNORMAL LOW (ref 3.5–5.1)
Sodium: 137 mmol/L (ref 135–145)
Sodium: 138 mmol/L (ref 135–145)

## 2019-01-14 LAB — CBC
HCT: 28.6 % — ABNORMAL LOW (ref 39.0–52.0)
HCT: 28 % — ABNORMAL LOW (ref 39.0–52.0)
Hemoglobin: 9.6 g/dL — ABNORMAL LOW (ref 13.0–17.0)
Hemoglobin: 9.1 g/dL — ABNORMAL LOW (ref 13.0–17.0)
MCH: 32 pg (ref 26.0–34.0)
MCH: 31.5 pg (ref 26.0–34.0)
MCHC: 33.6 g/dL (ref 30.0–36.0)
MCHC: 32.5 g/dL (ref 30.0–36.0)
MCV: 95.3 fL (ref 80.0–100.0)
MCV: 96.9 fL (ref 80.0–100.0)
Platelets: 71 10*3/uL — ABNORMAL LOW (ref 150–400)
Platelets: 72 10*3/uL — ABNORMAL LOW (ref 150–400)
RBC: 3 MIL/uL — ABNORMAL LOW (ref 4.22–5.81)
RBC: 2.89 MIL/uL — ABNORMAL LOW (ref 4.22–5.81)
RDW: 16.7 % — ABNORMAL HIGH (ref 11.5–15.5)
RDW: 16.5 % — ABNORMAL HIGH (ref 11.5–15.5)
WBC: 5.2 10*3/uL (ref 4.0–10.5)
WBC: 5 10*3/uL (ref 4.0–10.5)
nRBC: 0 % (ref 0.0–0.2)
nRBC: 0 % (ref 0.0–0.2)

## 2019-01-14 LAB — CULTURE, BLOOD (ROUTINE X 2)
Culture: NO GROWTH
Culture: NO GROWTH
Special Requests: ADEQUATE
Special Requests: ADEQUATE

## 2019-01-14 LAB — BLOOD GAS, ARTERIAL
Acid-Base Excess: 8.3 mmol/L — ABNORMAL HIGH (ref 0.0–2.0)
Acid-Base Excess: 8.7 mmol/L — ABNORMAL HIGH (ref 0.0–2.0)
Bicarbonate: 32.2 mmol/L — ABNORMAL HIGH (ref 20.0–28.0)
Bicarbonate: 32.4 mmol/L — ABNORMAL HIGH (ref 20.0–28.0)
FIO2: 40
FIO2: 40
O2 Saturation: 97.3 %
O2 Saturation: 96 %
Patient temperature: 37
Patient temperature: 37.8
pCO2 arterial: 38.2 mmHg (ref 32.0–48.0)
pCO2 arterial: 38.8 mmHg (ref 32.0–48.0)
pH, Arterial: 7.528 — ABNORMAL HIGH (ref 7.350–7.450)
pH, Arterial: 7.527 — ABNORMAL HIGH (ref 7.350–7.450)
pO2, Arterial: 90.5 mmHg (ref 83.0–108.0)
pO2, Arterial: 81.4 mmHg — ABNORMAL LOW (ref 83.0–108.0)

## 2019-01-14 LAB — GLUCOSE, CAPILLARY
Glucose-Capillary: 132 mg/dL — ABNORMAL HIGH (ref 70–99)
Glucose-Capillary: 132 mg/dL — ABNORMAL HIGH (ref 70–99)
Glucose-Capillary: 116 mg/dL — ABNORMAL HIGH (ref 70–99)

## 2019-01-14 LAB — MAGNESIUM: Magnesium: 1.9 mg/dL (ref 1.7–2.4)

## 2019-01-14 LAB — CREATININE, SERUM
Creatinine, Ser: 0.55 mg/dL — ABNORMAL LOW (ref 0.61–1.24)
GFR calc Af Amer: >60 mL/min (ref >60)
GFR calc non Af Amer: >60 mL/min (ref >60)

## 2019-01-14 MED ORDER — ACETYLCYSTEINE 10 % IN SOLN
2.0000 mL | RESPIRATORY_TRACT | Status: DC
  Filled 2019-01-14 (×17): qty 4

## 2019-01-14 MED ORDER — ACETYLCYSTEINE 20 % IN SOLN
3.0000 mL | RESPIRATORY_TRACT | Status: AC
  Administered 2019-01-15 (×7): 3 mL via RESPIRATORY_TRACT
  Administered 2019-01-16: 4 mL via RESPIRATORY_TRACT
  Administered 2019-01-16 – 2019-01-17 (×9): 3 mL via RESPIRATORY_TRACT
  Filled 2019-01-14 (×17): qty 4

## 2019-01-14 MED ORDER — ACETYLCYSTEINE 10 % IN SOLN: 2.0000 mL | RESPIRATORY_TRACT | Status: DC

## 2019-01-14 MED ORDER — VITAL HIGH PROTEIN PO LIQD
1000.0000 mL | ORAL | Status: DC
  Filled 2019-01-14 (×3): qty 1000

## 2019-01-14 MED ORDER — ACETYLCYSTEINE 20 % IN SOLN
RESPIRATORY_TRACT | Status: AC
  Administered 2019-01-14: 3 mL
  Filled 2019-01-14: qty 4

## 2019-01-14 MED ORDER — POTASSIUM CHLORIDE 20 MEQ/15ML (10%) PO SOLN
40.0000 meq | ORAL | Status: AC
  Administered 2019-01-14 (×2): 40 meq via ORAL
  Filled 2019-01-14 (×2): qty 30

## 2019-01-14 NOTE — Progress Notes (Signed)
Obtained  A NIF of -18 cmH2O and VC of .72 liters. Patient has been on PS10/ CPAP5 and 35% for 2.5 hours. The ABG on these settings is similar to the ABG obtained in full support mode.

## 2019-01-14 NOTE — Progress Notes (Signed)
Subjective: He did well with weaning yesterday.  He did have a moderate amount of secretions.  He required fairly high pressure support to maintain volumes.  He was more alert yesterday in the afternoon.  Objective: Vital signs in last 24 hours: Temp:  [97.5 F (36.4 C)-99.1 F (37.3 C)] 99.1 F (37.3 C) (05/15 0700) Pulse Rate:  [70-93] 72 (05/15 0700) Resp:  [15-21] 16 (05/15 0700) BP: (91-150)/(62-79) 137/71 (05/15 0700) SpO2:  [94 %-99 %] 98 % (05/15 0700) FiO2 (%):  [40 %] 40 % (05/15 0505) Weight:  [75.2 kg] 75.2 kg (05/15 0500) Weight change: -0.3 kg Last BM Date: 01/10/19  Intake/Output from previous day: 05/14 0701 - 05/15 0700 In: 1666.5 [I.V.:295.2; NG/GT:771.3; IV Piggyback:600] Out: 850 [Urine:850]  PHYSICAL EXAM General appearance: Intubated sedated on mechanical ventilation Resp: rhonchi bilaterally and Decreased breath sounds bilaterally Cardio: regular rate and rhythm, S1, S2 normal, no murmur, click, rub or gallop GI: soft, non-tender; bowel sounds normal; no masses,  no organomegaly Extremities: extremities normal, atraumatic, no cyanosis or edema  Lab Results:  Results for orders placed or performed during the hospital encounter of 01/09/19 (from the past 48 hour(s))  Glucose, capillary     Status: Abnormal   Collection Time: 01/12/19 11:12 AM  Result Value Ref Range   Glucose-Capillary 147 (H) 70 - 99 mg/dL  Vancomycin, trough     Status: Abnormal   Collection Time: 01/12/19 11:39 AM  Result Value Ref Range   Vancomycin Tr 9 (L) 15 - 20 ug/mL    Comment: Performed at [facility], [address], [place], [place] [address]  Glucose, capillary     Status: Abnormal   Collection Time: 01/12/19  4:35 PM  Result Value Ref Range   Glucose-Capillary 137 (H) 70 - 99 mg/dL  Comprehensive metabolic panel     Status: Abnormal   Collection Time: 01/12/19  5:08 PM  Result Value Ref Range   Sodium 136 135 - 145 mmol/L   Potassium 2.8 (L) 3.5 - 5.1 mmol/L   Chloride 101 98 - 111 mmol/L   CO2 27 22 - 32 mmol/L   Glucose, Bld 129 (H) 70 - 99 mg/dL   BUN 19 8 - 23 mg/dL   Creatinine, Ser 0.52 (L) 0.61 - 1.24 mg/dL   Calcium 8.5 (L) 8.9 - 10.3 mg/dL   Total Protein 5.7 (L) 6.5 - 8.1 g/dL   Albumin 2.6 (L) 3.5 - 5.0 g/dL   AST 44 (H) 15 - 41 U/L   ALT 44 0 - 44 U/L   Alkaline Phosphatase 90 38 - 126 U/L   Total Bilirubin 1.0 0.3 - 1.2 mg/dL   GFR calc non Af Amer >60 >60 mL/min   GFR calc Af Amer >60 >60 mL/min   Anion gap 8 5 - 15    Comment: Performed at [facility], [address] [place], [place] [address]  Glucose, capillary     Status: Abnormal   Collection Time: 01/12/19  8:22 PM  Result Value Ref Range   Glucose-Capillary 147 (H) 70 - 99 mg/dL  Glucose, capillary     Status: Abnormal   Collection Time: 01/13/19 12:22 AM  Result Value Ref Range   Glucose-Capillary 124 (H) 70 - 99 mg/dL   Comment 1 Notify RN    Comment 2 Document in Chart   Blood gas, arterial     Status: Abnormal   Collection Time: 01/13/19  4:14 AM  Result Value Ref Range   FIO2 40.00  Delivery systems VENTILATOR    Mode PRESSURE REGULATED VOLUME CONTROL    VT 540 mL   LHR 16 resp/min   pH, Arterial 7.522 (H) 7.350 - 7.450   pCO2 arterial 38.3 32.0 - 48.0 mmHg   pO2, Arterial 90.0 83.0 - 108.0 mmHg   Bicarbonate 31.8 (H) 20.0 - 28.0 mmol/L   Acid-Base Excess 7.9 (H) 0.0 - 2.0 mmol/L   O2 Saturation 96.8 %   Patient temperature 37.0    Collection site LEFT RADIAL    Drawn by [id number]    Allens test (pass/fail) PASS PASS    Comment: Performed at [facility], [address] [place], [place] [address]  Glucose, capillary     Status: Abnormal   Collection Time: 01/13/19  4:31 AM  Result Value Ref Range   Glucose-Capillary 131 (H) 70 - 99 mg/dL   Comment 1 Notify RN    Comment 2 Document in Chart   CBC     Status: Abnormal   Collection Time: 01/13/19  4:58 AM  Result Value Ref Range   WBC 7.1 4.0 - 10.5 K/uL   RBC 3.24 (L) 4.22 - 5.81 MIL/uL    Hemoglobin 10.3 (L) 13.0 - 17.0 g/dL   HCT 30.2 (L) 39.0 - 52.0 %   MCV 93.2 80.0 - 100.0 fL   MCH 31.8 26.0 - 34.0 pg   MCHC 34.1 30.0 - 36.0 g/dL   RDW 17.1 (H) 11.5 - 15.5 %   Platelets 66 (L) 150 - 400 K/uL    Comment: SPECIMEN CHECKED FOR CLOTS Immature Platelet Fraction may be clinically indicated, consider ordering this additional test [id number] CONSISTENT WITH PREVIOUS RESULT    nRBC 0.0 0.0 - 0.2 %    Comment: Performed at [facility], [address], [place], [place] [address]  Magnesium     Status: Abnormal   Collection Time: 01/13/19  4:58 AM  Result Value Ref Range   Magnesium 1.6 (L) 1.7 - 2.4 mg/dL    Comment: Performed at [facility], [address] [place], [place] [address]  Phosphorus     Status: None   Collection Time: 01/13/19  4:58 AM  Result Value Ref Range   Phosphorus 3.2 2.5 - 4.6 mg/dL    Comment: Performed at [facility], [address] [place], [place] [address]  Glucose, capillary     Status: Abnormal   Collection Time: 01/13/19  7:26 AM  Result Value Ref Range   Glucose-Capillary 109 (H) 70 - 99 mg/dL  Basic metabolic panel     Status: Abnormal   Collection Time: 01/13/19  8:59 AM  Result Value Ref Range   Sodium 138 135 - 145 mmol/L   Potassium 2.7 (LL) 3.5 - 5.1 mmol/L    Comment: CRITICAL RESULT CALLED TO, READ BACK BY AND VERIFIED WITH: JONES J. AT 0939A ON [id number] BY THOMPSON S.    Chloride 97 (L) 98 - 111 mmol/L   CO2 29 22 - 32 mmol/L   Glucose, Bld 120 (H) 70 - 99 mg/dL   BUN 17 8 - 23 mg/dL   Creatinine, Ser 0.60 (L) 0.61 - 1.24 mg/dL   Calcium 8.5 (L) 8.9 - 10.3 mg/dL   GFR calc non Af Amer >60 >60 mL/min   GFR calc Af Amer >60 >60 mL/min   Anion gap 12 5 - 15    Comment: Performed at [facility], [address] [place],  [address]  Magnesium     Status: Abnormal   Collection  Time: 01/13/19  8:59 AM  Result Value Ref Range   Magnesium 1.6 (L) 1.7 - 2.4 mg/dL    Comment: Performed at [facility], [address],  [place], [place] [address]  Phosphorus     Status: None   Collection Time: 01/13/19  8:59 AM  Result Value Ref Range   Phosphorus 3.4 2.5 - 4.6 mg/dL    Comment: Performed at [facility], [address], [place], [place] [address]  Glucose, capillary     Status: Abnormal   Collection Time: 01/13/19 11:23 AM  Result Value Ref Range   Glucose-Capillary 110 (H) 70 - 99 mg/dL  Glucose, capillary     Status: Abnormal   Collection Time: 01/13/19  4:39 PM  Result Value Ref Range   Glucose-Capillary 147 (H) 70 - 99 mg/dL   Comment 1 Notify RN    Comment 2 Document in Chart   Basic metabolic panel     Status: Abnormal   Collection Time: 01/13/19  5:52 PM  Result Value Ref Range   Sodium 134 (L) 135 - 145 mmol/L   Potassium 3.3 (L) 3.5 - 5.1 mmol/L    Comment: DELTA CHECK NOTED   Chloride 97 (L) 98 - 111 mmol/L   CO2 29 22 - 32 mmol/L   Glucose, Bld 148 (H) 70 - 99 mg/dL   BUN 19 8 - 23 mg/dL   Creatinine, Ser 0.52 (L) 0.61 - 1.24 mg/dL   Calcium 8.0 (L) 8.9 - 10.3 mg/dL   GFR calc non Af Amer >60 >60 mL/min   GFR calc Af Amer >60 >60 mL/min   Anion gap 8 5 - 15    Comment: Performed at [facility], [address] [place] [place], [place] [address]  Glucose, capillary     Status: Abnormal   Collection Time: 01/13/19  9:10 PM  Result Value Ref Range   Glucose-Capillary 132 (H) 70 - 99 mg/dL   Comment 1 Notify RN    Comment 2 Document in Chart   Glucose, capillary     Status: Abnormal   Collection Time: 01/13/19 11:52 PM  Result Value Ref Range   Glucose-Capillary 129 (H) 70 - 99 mg/dL   Comment 1 Notify RN    Comment 2 Document in Chart   Creatinine, serum     Status: Abnormal   Collection Time: 01/14/19  4:24 AM  Result Value Ref Range   Creatinine, Ser 0.55 (L) 0.61 - 1.24 mg/dL   GFR calc non Af Amer >60 >60 mL/min   GFR calc Af Amer >60 >60 mL/min    Comment: Performed at [facility], [address] [place], [place] [address]  CBC     Status: Abnormal   Collection Time: 01/14/19   4:24 AM  Result Value Ref Range   WBC 5.2 4.0 - 10.5 K/uL   RBC 3.00 (L) 4.22 - 5.81 MIL/uL   Hemoglobin 9.6 (L) 13.0 - 17.0 g/dL   HCT 28.6 (L) 39.0 - 52.0 %   MCV 95.3 80.0 - 100.0 fL   MCH 32.0 26.0 - 34.0 pg   MCHC 33.6 30.0 - 36.0 g/dL   RDW 16.7 (H) 11.5 - 15.5 %   Platelets 71 (L) 150 - 400 K/uL    Comment: REPEATED TO VERIFY SPECIMEN CHECKED FOR CLOTS Immature Platelet Fraction may be clinically indicated, consider ordering this additional test [id number] CONSISTENT WITH PREVIOUS RESULT    nRBC 0.0 0.0 - 0.2 %    Comment: Performed at [facility], [address], [place], [place] [address]  Basic metabolic panel     Status: Abnormal   Collection Time: 01/14/19  4:24 AM  Result Value Ref Range   Sodium 137 135 - 145 mmol/L   Potassium 3.2 (L) 3.5 - 5.1 mmol/L   Chloride 99 98 - 111 mmol/L   CO2 30 22 - 32 mmol/L   Glucose, Bld 125 (H) 70 - 99 mg/dL   BUN 23 8 - 23 mg/dL   Creatinine, Ser 0.49 (L) 0.61 - 1.24 mg/dL   Calcium 8.3 (L) 8.9 - 10.3 mg/dL   GFR calc non Af Amer >60 >60 mL/min   GFR calc Af Amer >60 >60 mL/min   Anion gap 8 5 - 15    Comment: Performed at [facility], [address], [place], [place] [address]  Glucose, capillary     Status: Abnormal   Collection Time: 01/14/19  4:31 AM  Result Value Ref Range   Glucose-Capillary 132 (H) 70 - 99 mg/dL   Comment 1 Notify RN    Comment 2 Document in Chart   Blood gas, arterial     Status: Abnormal   Collection Time: 01/14/19  5:00 AM  Result Value Ref Range   FIO2 40.00    pH, Arterial 7.528 (H) 7.350 - 7.450   pCO2 arterial 38.2 32.0 - 48.0 mmHg   pO2, Arterial 90.5 83.0 - 108.0 mmHg   Bicarbonate 32.2 (H) 20.0 - 28.0 mmol/L   Acid-Base Excess 8.3 (H) 0.0 - 2.0 mmol/L   O2 Saturation 97.3 %   Patient temperature 37.0    Allens test (pass/fail) PASS PASS    Comment: Performed at [facility], [address] [place], [place] [address]  Glucose, capillary     Status: Abnormal   Collection Time:  01/14/19  7:44 AM  Result Value Ref Range   Glucose-Capillary 132 (H) 70 - 99 mg/dL    ABGS Recent Labs    01/14/19 0500  PHART 7.528*  PO2ART 90.5  HCO3 32.2*   CULTURES Recent Results (from the past 240 hour(s))  Blood Culture (routine x 2)     Status: None   Collection Time: 01/09/19 11:13 AM  Result Value Ref Range Status   Specimen Description BLOOD LEFT ARM BOTTLES DRAWN AEROBIC AND ANAEROBIC  Final   Special Requests Blood Culture adequate volume  Final   Culture   Final    NO GROWTH 5 DAYS Performed at [facility], [address], [place], [place] [address]    Report Status 01/14/2019 FINAL  Final  Blood Culture (routine x 2)     Status: None   Collection Time: 01/09/19 11:35 AM  Result Value Ref Range Status   Specimen Description   Final    LEFT ANTECUBITAL BOTTLES DRAWN AEROBIC AND ANAEROBIC   Special Requests Blood Culture adequate volume  Final   Culture   Final    NO GROWTH 5 DAYS Performed at [facility], [address], [place],  [address]    Report Status 01/14/2019 FINAL  Final  SARS Coronavirus 2 (CEPHEID - Performed in [facility] hospital lab), Hosp Order     Status: None   Collection Time: 01/09/19  2:58 PM  Result Value Ref Range Status   SARS Coronavirus 2 NEGATIVE NEGATIVE Final    Comment: (NOTE) If result is NEGATIVE SARS-CoV-2 target nucleic acids are NOT DETECTED. The SARS-CoV-2 RNA is generally detectable in upper and lower  respiratory specimens during the acute phase  of infection. The lowest  concentration of SARS-CoV-2 viral copies this assay can detect is 250  copies / mL. A negative result does not preclude SARS-CoV-2 infection  and should not be used as the sole basis for treatment or other  patient management decisions.  A negative result may occur with  improper specimen collection / handling, submission of specimen other  than nasopharyngeal swab, presence of viral mutation(s) within the  areas targeted by this assay,  and inadequate number of viral copies  (<250 copies / mL). A negative result must be combined with clinical  observations, patient history, and epidemiological information. If result is POSITIVE SARS-CoV-2 target nucleic acids are DETECTED. The SARS-CoV-2 RNA is generally detectable in upper and lower  respiratory specimens dur ing the acute phase of infection.  Positive  results are indicative of active infection with SARS-CoV-2.  Clinical  correlation with patient history and other diagnostic information is  necessary to determine patient infection status.  Positive results do  not rule out bacterial infection or co-infection with other viruses. If result is PRESUMPTIVE POSTIVE SARS-CoV-2 nucleic acids MAY BE PRESENT.   A presumptive positive result was obtained on the submitted specimen  and confirmed on repeat testing.  While 2019 novel coronavirus  (SARS-CoV-2) nucleic acids may be present in the submitted sample  additional confirmatory testing may be necessary for epidemiological  and / or clinical management purposes  to differentiate between  SARS-CoV-2 and other Sarbecovirus currently known to infect humans.  If clinically indicated additional testing with an alternate test  methodology [id number]) is advised. The SARS-CoV-2 RNA is generally  detectable in upper and lower respiratory sp ecimens during the acute  phase of infection. The expected result is Negative. Fact Sheet for Patients:  StrictlyIdeas.no Fact Sheet for Healthcare Providers: BankingDealers.co.za This test is not yet approved or cleared by the [place] FDA and has been authorized for detection and/or diagnosis of SARS-CoV-2 by FDA under an Emergency Use Authorization (EUA).  This EUA will remain in effect (meaning this test can be used) for the duration of the COVID-19 declaration under Section 564(b)(1) of the Act, 21 U.S.C. section 360bbb-3(b)(1), unless  the authorization is terminated or revoked sooner. Performed at [facility], [address] [place], [place] [address]   MRSA PCR Screening     Status: Abnormal   Collection Time: 01/09/19  8:47 PM  Result Value Ref Range Status   MRSA by PCR POSITIVE (A) NEGATIVE Final    Comment:        The GeneXpert MRSA Assay (FDA approved for NASAL specimens only), is one component of a comprehensive MRSA colonization surveillance program. It is not intended to diagnose MRSA infection nor to guide or monitor treatment for MRSA infections. RESULT CALLED TO, READ BACK BY AND VERIFIED WITH: TETREAULT,H @ 0007 ON 01/10/19 BY JUW Performed at [facility], [address] [place], [place] [address]   Culture, respiratory     Status: None   Collection Time: 01/09/19 11:39 PM  Result Value Ref Range Status   Specimen Description   Final    TRACHEAL ASPIRATE Performed at [facility], [address], [place], [place] [address]    Special Requests   Final    NONE Performed at [facility], [address] [place], [place] [address]    Gram Stain   Final    MODERATE WBC PRESENT, PREDOMINANTLY PMN RARE GRAM POSITIVE COCCI RARE YEAST Performed at [facility], [place] [address]  [place],  [address]    Culture   Final    MODERATE METHICILLIN RESISTANT STAPHYLOCOCCUS AUREUS   Report Status 01/12/2019 FINAL  Final   Organism ID, Bacteria METHICILLIN RESISTANT STAPHYLOCOCCUS AUREUS  Final      Susceptibility   Methicillin resistant staphylococcus aureus - MIC*    CIPROFLOXACIN >=8 RESISTANT Resistant     ERYTHROMYCIN >=8 RESISTANT Resistant     GENTAMICIN <=0.5 SENSITIVE Sensitive     OXACILLIN >=4 RESISTANT Resistant     TETRACYCLINE <=1 SENSITIVE Sensitive     VANCOMYCIN 1 SENSITIVE Sensitive     TRIMETH/SULFA <=10 SENSITIVE Sensitive     CLINDAMYCIN <=0.25 SENSITIVE Sensitive     RIFAMPIN <=0.5 SENSITIVE Sensitive     Inducible Clindamycin NEGATIVE Sensitive     * MODERATE  METHICILLIN RESISTANT STAPHYLOCOCCUS AUREUS   Studies/Results: Dg Chest Port 1 View  Result Date: 01/13/2019 CLINICAL DATA:  85 year old male with respiratory failure, smoker. Negative for COVID-19 on 01/09/2019. EXAM: PORTABLE CHEST 1 VIEW COMPARISON:  01/12/2019 and earlier. FINDINGS: Portable AP semi upright view at at 0525 hours. Endotracheal tube tip between the level the clavicles and carina. Stable visible enteric tube and right PICC line. Stable lung volumes and ventilation with continued dense left lung base opacity obscuring the diaphragm and patchy/confluent multifocal right lower lung opacity. Stable increased interstitial markings in both lungs which might reflect vascular congestion on chronic lung disease. Stable cardiac size and mediastinal contours. Paucity of bowel gas in the upper abdomen. IMPRESSION: 1. Stable lines and tubes. 2. Stable ventilation with continued dense left lower lobe collapse or consolidation and patchy right lung base opacity. 3. Suspect pulmonary vascular congestion superimposed on chronic interstitial lung disease. Electronically Signed   By: Genevie Ann M.D.   On: 01/13/2019 08:39   Dg Chest Port 1 View  Result Date: 01/12/2019 CLINICAL DATA:  85 year old male with a history PICC placement EXAM: PORTABLE CHEST 1 VIEW COMPARISON:  01/12/2019 FINDINGS: Interval placement of right upper extremity PICC which appears to terminate near the superior cavoatrial junction. Cardiomediastinal silhouette likely unchanged in size and contour. Heart borders partially obscured by lung/pleural disease. Persistently low lung volumes with mixed interstitial and airspace opacities most prominent at the lung bases. Obscuration left hemidiaphragm in the left heart border persists. No change in aeration. No pneumothorax. Endotracheal tube terminates 4.8 cm above the carina. Gastric tube terminates in the left upper quadrant. IMPRESSION: Interval placement of right upper extremity PICC which  appears to terminate superior cavoatrial junction. Similar appearance of mixed interstitial and airspace disease with likely left greater than right pleural effusion and associated atelectasis/consolidation. Unchanged endotracheal tube and gastric tube. Electronically Signed   By: Corrie Mckusick D.O.   On: 01/12/2019 17:01   [place] Ekg Site Rite  Result Date: 01/12/2019 If Site Rite image not attached, placement could not be confirmed due to current cardiac rhythm.   Medications:  Prior to Admission:  Medications Prior to Admission  Medication Sig Dispense Refill Last Dose  . acetaminophen (TYLENOL) 500 MG tablet Take 1,000 mg by mouth every 6 (six) hours as needed. Pain   Past Week at Unknown  . allopurinol (ZYLOPRIM) 300 MG tablet Take 300 mg by mouth daily.   01/08/2019 at Unknown time  . aspirin 81 MG chewable tablet Chew 81 mg by mouth daily.   01/08/2019 at 0930  . busPIRone (BUSPAR) 15 MG tablet Take 15 mg by mouth 2 (two) times daily.   01/08/2019 at Unknown time  .  calcium gluconate 500 MG tablet Take 1 tablet by mouth daily.   01/08/2019 at Unknown time  . citalopram (CELEXA) 20 MG tablet Take 20 mg by mouth daily.   01/08/2019 at Unknown time  . folic acid (FOLVITE) 1 MG tablet Take 1 mg by mouth daily.   01/08/2019 at Unknown time  . gabapentin (NEURONTIN) 300 MG capsule Take 300 mg by mouth 3 (three) times daily.   01/08/2019 at Unknown time  . Melatonin 5 MG TABS Take 1 tablet by mouth at bedtime.   01/08/2019 at Unknown time  . midodrine (PROAMATINE) 2.5 MG tablet Take 2.5 mg by mouth 2 (two) times a day.   01/08/2019 at Unknown time  . Omega-3 Fatty Acids (FISH OIL) 1000 MG CAPS Take 1 capsule by mouth 2 (two) times a day.   01/08/2019 at Unknown time  . omeprazole (PRILOSEC) 20 MG capsule Take 20 mg by mouth daily.   01/08/2019 at Unknown time  . Tamsulosin HCl (FLOMAX) 0.4 MG CAPS Take 0.4 mg by mouth daily.    01/08/2019 at Unknown time  . traMADol (ULTRAM) 50 MG tablet Take 50 mg by mouth 3 (three)  times daily.   01/08/2019 at Unknown time  . vitamin B-12 (CYANOCOBALAMIN) 1000 MCG tablet Take 1,000 mcg by mouth daily.   01/08/2019 at Unknown time  . ciprofloxacin (CIPRO) 500 MG tablet Take 500 mg by mouth 2 (two) times daily.   Completed Course at Unknown time   Scheduled: . albuterol  2.5 mg Nebulization Q4H  . chlorhexidine gluconate (MEDLINE KIT)  15 mL Mouth Rinse BID  . Chlorhexidine Gluconate Cloth  6 each Topical Q0600  . Chlorhexidine Gluconate Cloth  6 each Topical Daily  . collagenase   Topical Daily  . feeding supplement (VITAL HIGH PROTEIN)  1,000 mL Per Tube Q24H  . hydrocortisone sod succinate (SOLU-CORTEF) inj  25 mg Intravenous Q12H  . mouth rinse  15 mL Mouth Rinse 10 times per day  . mupirocin ointment  1 application Nasal BID  . sodium chloride flush  10-40 mL Intracatheter Q12H   Continuous: . sodium chloride Stopped (01/12/19 1717)  . dexmedetomidine (PRECEDEX) IV infusion 0.5 mcg/kg/hr (01/14/19 0717)  . famotidine (PEPCID) IV Stopped (01/13/19 2227)  . vancomycin 1,750 mg (01/13/19 1232)   DHR:CBULAG chloride, acetaminophen, fentaNYL (SUBLIMAZE) injection, fentaNYL (SUBLIMAZE) injection, hydrALAZINE, ipratropium-albuterol, midazolam, midazolam, ondansetron (ZOFRAN) IV, sodium chloride flush  Assesment: He was admitted with sepsis related to pneumonia.  He has resolved sepsis pathophysiology.  He has acute hypoxic and hypercapnic respiratory failure and he did pretty well with weaning yesterday.  He had significant metabolic encephalopathy and yesterday he was able to respond better although this morning he is sedated  He has had trouble with heart failure and received Lasix and seems more euvolemic now  He has thrombocytopenia which is an ongoing issue  He has had trouble tolerating tube feedings and that seems to be doing better  He has had abnormal liver function testing which is being followed probably related to sepsis Principal Problem:   HCAP  (healthcare-associated pneumonia) Active Problems:   Sepsis with acute hypoxic respiratory failure (HCC)   Hypotension   Abnormal liver function   Pressure injury of skin   Sepsis due to undetermined organism ([place])   Acute respiratory failure with hypoxia (HCC)   Thrombocytopenia ([place])   Acute CHF (congestive heart failure) ([place])   Altered mental status   Acute metabolic encephalopathy   Goals of care, counseling/discussion  Palliative care encounter   Pancytopenia ([place])   Pneumonia of left lower lobe due to infectious organism [facility])    Plan: Try weaning again today.  If he is more alert may be able to consider extubation.  He did require higher pressure support yesterday.    LOS: 5 days   Alonza Bogus 01/14/2019, 8:18 AM

## 2019-01-14 NOTE — Procedures (Signed)
Extubation Procedure Note Pre extubation patients mouth and ETT suctioned.  Patient Details:   Name: DUSTEN ELLINWOOD DOB: 1932-10-05 MRN: [id number]   Airway Documentation:    Vent end date:01/14/2019 Vent end time 1625 Patient placed on 4L [place] post extubation  Evaluation  O2 sats: stable throughout Complications: No apparent complications Patient did tolerate procedure well. Bilateral Breath Sounds: Diminished   Yes  Pete Pelt 01/14/2019, 4:27 PM

## 2019-01-14 NOTE — Care Management Important Message (Signed)
Important Message  Patient Details  Name: John Munoz MRN: [id number] Date of Birth: [birth year]   Medicare Important Message Given:  Yes    [name] 01/14/2019, 1:17 PM

## 2019-01-14 NOTE — Progress Notes (Signed)
PROGRESS NOTE    John Munoz   BCW:[id number]  DOB: 07-27-1933  DOA: 01/09/2019 PCP: Neale Burly, MD   Brief Narrative:  John Munoz is a 85 y.o. male from home with great neice with medical history significant for COPD, depression, BPH, recent right heel pressure ulcer with cellulitis and necrosis, recent UTI, brought to the ED with reports of altered mental status, lethargic of 2 days duration.  In ED, temp 91.1, tachypnea, CT chest> left > right lower lobe infiltrates. Started on  Vanc and Cefepime for HCAP and intubated in ED. COVID neg. MRSA screen +.  Admitted for HCAP, acute diastolic CHF, acute encephalopathy. Made DNR the following day. Venous duplex 01/10/19- no DVT   Subjective: Intubated.   Assessment & Plan:   Principal Problem:     Sepsis- hypothermia, hypotension   Acute respiratory failure with hypoxia due to HCAP and acute diastolic CHF - remains intubated and on Vanc, Flagyl and Cefepime- respiratory culture reveals moderate MRSA - would continue all three antibiotics for now - he has been weaned off of Levophed - d/c stress dose steroids - Pulmonary plans to try to extubate today - he is awake but not following commands - 2 D ECHO reveals normal EF- ? If he has diastolic dysfunction- I am trying to keep him on the dry side  Active Problems:  Vomiting tube feeds - 5/13> RN has tried to advance the tube as CXR suspected it was in his esophagus- despite this, he continues to vomit- he is a high aspiration risk and with repeated aspirations, a high risk for death despite being intubated- - Tube feeds held because of vomiting and then resumed the following day at  30 cc/hr today- he is tolerating this rate well- will increase to 40 today-   Anemia - Hb 7.2 on 5/1 for which he was transfused 2 U PRBC- anemia panel was consistent with AOCD and no GI bleed was noted  Electrolyte abnormalities - low K, Mag and phos- will continue to replace and follow       Abnormal liver function/ thrombocytopenia - suspected to be due to sepsis- Hepatitis panel negative - noted to be improving    Pressure injury of skin- stage 3 left heel - appreciate wound care eval - recommendations> Enzymatic debridement and saline moist gauze. Cover with dry dressing. Change daily.    Time spent in minutes: 35 min DVT prophylaxis: SCDs Code Status: DNR- family will consider transitioning to comfort if condition does not improve in next few days Family Communication:  Disposition Plan: follow in ICU Consultants:   pulmonary Procedures:  2 D ECHO 1. The left ventricle has normal systolic function, with an ejection fraction of 55-60%. The cavity size was normal. There is mild concentric left ventricular hypertrophy. Left ventricular diastolic Doppler parameters are indeterminate. No evidence of  left ventricular regional wall motion abnormalities.  2. The right ventricle has normal systolic function. The cavity was normal. There is no increase in right ventricular wall thickness.  3. Left atrial size was mildly dilated.  4. The mitral valve is grossly normal. Mild thickening of the mitral valve leaflet. There is mild to moderate mitral annular calcification present.  5. The tricuspid valve is grossly normal.  6. The aortic valve is tricuspid. Mild thickening of the aortic valve. Mild calcification of the aortic valve. Aortic valve regurgitation is trivial by color flow Doppler.  7. Moderate aortic annular calcification.  8. The aortic root is normal  in size and structure.  9. The inferior vena cava was dilated in size with <50% respiratory variability.  Antimicrobials:  Anti-infectives (From admission, onward)   Start     Dose/Rate Route Frequency Ordered Stop   01/13/19 1300  vancomycin (VANCOCIN) 1,750 mg in sodium chloride 0.9 % 500 mL IVPB     1,750 mg 250 mL/hr over 120 Minutes Intravenous Every 24 hours 01/12/19 1306     01/10/19 1230  vancomycin  (VANCOCIN) 1,500 mg in sodium chloride 0.9 % 500 mL IVPB  Status:  Discontinued     1,500 mg 250 mL/hr over 120 Minutes Intravenous Every 24 hours 01/09/19 1230 01/10/19 0759   01/10/19 1230  vancomycin (VANCOCIN) 1,500 mg in sodium chloride 0.9 % 500 mL IVPB     1,500 mg 250 mL/hr over 120 Minutes Intravenous Every 24 hours 01/10/19 0800 01/12/19 1622   01/09/19 2000  ceFEPIme (MAXIPIME) 2 g in sodium chloride 0.9 % 100 mL IVPB     2 g 200 mL/hr over 30 Minutes Intravenous Every 8 hours 01/09/19 1219 01/11/19 2211   01/09/19 1330  vancomycin (VANCOCIN) 500 mg in sodium chloride 0.9 % 100 mL IVPB     500 mg 100 mL/hr over 60 Minutes Intravenous  Once 01/09/19 1221 01/09/19 1502   01/09/19 1115  ceFEPIme (MAXIPIME) 2 g in sodium chloride 0.9 % 100 mL IVPB     2 g 200 mL/hr over 30 Minutes Intravenous  Once 01/09/19 1112 01/09/19 1214   01/09/19 1115  metroNIDAZOLE (FLAGYL) IVPB 500 mg     500 mg 100 mL/hr over 60 Minutes Intravenous  Once 01/09/19 1112 01/09/19 1239   01/09/19 1115  vancomycin (VANCOCIN) IVPB 1000 mg/200 mL premix     1,000 mg 200 mL/hr over 60 Minutes Intravenous  Once 01/09/19 1112 01/09/19 1336       Objective: Vitals:   01/14/19 0846 01/14/19 0900 01/14/19 1000 01/14/19 1005  BP:  136/77 (!) 111/97   Pulse:  74 76   Resp:  17 18   Temp:  99.6 F (37.6 C) 99.2 F (37.3 C)   TempSrc:      SpO2: 98% 99% 98% 98%  Weight:      Height:        Intake/Output Summary (Last 24 hours) at 01/14/2019 1059 Last data filed at 01/14/2019 1007 Gross per 24 hour  Intake 1680.98 ml  Output 850 ml  Net 830.98 ml   Filed Weights   01/11/19 1125 01/13/19 0500 01/14/19 0500  Weight: 79.5 kg 75.5 kg 75.2 kg    Examination: General exam: Appears comfortable  HEENT: PERRL, no sclera icterus or thrush- intubated Respiratory system: Clear to auscultation. Respiratory effort normal. Cardiovascular system: S1 & S2 heard,  No murmurs  Gastrointestinal system: Abdomen soft,  non-tender, nondistended. Normal bowel sounds   Central nervous system: Alert  - does not follow commands Extremities: No cyanosis, clubbing or edema      Data Reviewed: I have personally reviewed following labs and imaging studies  CBC: Recent Labs  Lab 01/09/19 1113  01/11/19 0419 01/12/19 0525 01/13/19 0458 01/14/19 0424 01/14/19 0812  WBC 3.7*   < > 3.6* 10.1 7.1 5.2 5.0  NEUTROABS 2.9  --   --   --   --   --   --   HGB 8.0*   < > 9.9* 11.0* 10.3* 9.6* 9.1*  HCT 23.7*   < > 29.9* 32.7* 30.2* 28.6* 28.0*  MCV 100.0   < >  94.9 94.2 93.2 95.3 96.9  PLT 101*   < > 65* 63* 66* 71* 72*   < > = values in this interval not displayed.   Basic Metabolic Panel: Recent Labs  Lab 01/12/19 0525 01/12/19 1708 01/13/19 0458 01/13/19 0859 01/13/19 1752 01/14/19 0424 01/14/19 0812  NA 140 136  --  138 134* 137 138  K 3.0* 2.8*  --  2.7* 3.3* 3.2* 3.4*  CL 105 101  --  97* 97* 99 100  CO2 25 27  --  _0 GLUCOSE 186* 129*  --  120* 148* 125* 132*  BUN 22 19  --  _1 CREATININE 0.59* 0.52*  --  0.60* 0.52* 0.49*   0.55* 0.51*  CALCIUM 9.0 8.5*  --  8.5* 8.0* 8.3* 8.3*  MG 1.6*  --  1.6* 1.6*  --   --   --   PHOS 1.7*  --  3.2 3.4  --   --   --    GFR: Estimated Creatinine Clearance: 67.7 mL/min (A) (by C-G formula based on SCr of 0.51 mg/dL (L)). Liver Function Tests: Recent Labs  Lab 01/09/19 1113 01/10/19 0413 01/11/19 0419 01/12/19 0525 01/12/19 1708  AST 63* 72* 87* 56* 44*  ALT 49* 47* 53* 49* 44  ALKPHOS 106 95 99 92 90  BILITOT 0.5 0.5 1.0 0.7 1.0  PROT 6.0* 5.4* 5.9* 5.9* 5.7*  ALBUMIN 3.0* 2.7* 2.9* 2.7* 2.6*   No results for input(s): LIPASE, AMYLASE in the last 168 hours. No results for input(s): AMMONIA in the last 168 hours. Coagulation Profile: Recent Labs  Lab 01/10/19 0935  INR 1.1   Cardiac Enzymes: Recent Labs  Lab 01/09/19 1516 01/10/19 0413 01/10/19 0546 01/10/19 0935 01/10/19 1516 01/10/19 2003  CKTOTAL 551*  --    --   --   --   --   TROPONINI <0.03 0.46* 0.54* 0.78* 0.89* 0.93*   BNP (last 3 results) No results for input(s): PROBNP in the last 8760 hours. HbA1C: No results for input(s): HGBA1C in the last 72 hours. CBG: Recent Labs  Lab 01/13/19 1639 01/13/19 2110 01/13/19 2352 01/14/19 0431 01/14/19 0744  GLUCAP 147* 132* 129* 132* 132*   Lipid Profile: No results for input(s): CHOL, HDL, LDLCALC, TRIG, CHOLHDL, LDLDIRECT in the last 72 hours. Thyroid Function Tests: No results for input(s): TSH, T4TOTAL, FREET4, T3FREE, THYROIDAB in the last 72 hours. Anemia Panel: No results for input(s): VITAMINB12, FOLATE, FERRITIN, TIBC, IRON, RETICCTPCT in the last 72 hours. Urine analysis:    Component Value Date/Time   COLORURINE YELLOW 01/09/2019 1114   APPEARANCEUR CLEAR 01/09/2019 1114   LABSPEC 1.017 01/09/2019 1114   PHURINE 5.0 01/09/2019 1114   GLUCOSEU NEGATIVE 01/09/2019 1114   [place] 01/09/2019 [place] 01/09/2019 [place] 01/09/2019 [place] 01/09/2019 1114   NITRITE NEGATIVE 01/09/2019 1114   LEUKOCYTESUR TRACE (A) 01/09/2019 1114   Sepsis Labs: _2 (procalcitonin:4,lacticidven:4) ) Recent Results (from the past 240 hour(s))  Blood Culture (routine x 2)     Status: None   Collection Time: 01/09/19 11:13 AM  Result Value Ref Range Status   Specimen Description BLOOD LEFT ARM BOTTLES DRAWN AEROBIC AND ANAEROBIC  Final   Special Requests Blood Culture adequate volume  Final   Culture   Final    NO GROWTH 5 DAYS Performed at [facility], [address], [place], [place] [address]  Report Status 01/14/2019 FINAL  Final  Blood Culture (routine x 2)     Status: None   Collection Time: 01/09/19 11:35 AM  Result Value Ref Range Status   Specimen Description   Final    LEFT ANTECUBITAL BOTTLES DRAWN AEROBIC AND ANAEROBIC   Special Requests Blood Culture adequate volume  Final   Culture   Final    NO  GROWTH 5 DAYS Performed at [facility], [address] [place], [place] [address]    Report Status 01/14/2019 FINAL  Final  SARS Coronavirus 2 (CEPHEID - Performed in [place] hospital lab), Hosp Order     Status: None   Collection Time: 01/09/19  2:58 PM  Result Value Ref Range Status   SARS Coronavirus 2 NEGATIVE NEGATIVE Final    Comment: (NOTE) If result is NEGATIVE SARS-CoV-2 target nucleic acids are NOT DETECTED. The SARS-CoV-2 RNA is generally detectable in upper and lower  respiratory specimens during the acute phase of infection. The lowest  concentration of SARS-CoV-2 viral copies this assay can detect is 250  copies / mL. A negative result does not preclude SARS-CoV-2 infection  and should not be used as the sole basis for treatment or other  patient management decisions.  A negative result may occur with  improper specimen collection / handling, submission of specimen other  than nasopharyngeal swab, presence of viral mutation(s) within the  areas targeted by this assay, and inadequate number of viral copies  (<250 copies / mL). A negative result must be combined with clinical  observations, patient history, and epidemiological information. If result is POSITIVE SARS-CoV-2 target nucleic acids are DETECTED. The SARS-CoV-2 RNA is generally detectable in upper and lower  respiratory specimens dur ing the acute phase of infection.  Positive  results are indicative of active infection with SARS-CoV-2.  Clinical  correlation with patient history and other diagnostic information is  necessary to determine patient infection status.  Positive results do  not rule out bacterial infection or co-infection with other viruses. If result is PRESUMPTIVE POSTIVE SARS-CoV-2 nucleic acids MAY BE PRESENT.   A presumptive positive result was obtained on the submitted specimen  and confirmed on repeat testing.  While 2019 novel coronavirus  (SARS-CoV-2) nucleic acids may be present in  the submitted sample  additional confirmatory testing may be necessary for epidemiological  and / or clinical management purposes  to differentiate between  SARS-CoV-2 and other Sarbecovirus currently known to infect humans.  If clinically indicated additional testing with an alternate test  methodology [phone]) is advised. The SARS-CoV-2 RNA is generally  detectable in upper and lower respiratory sp ecimens during the acute  phase of infection. The expected result is Negative. Fact Sheet for Patients:  StrictlyIdeas.no Fact Sheet for Healthcare Providers: BankingDealers.co.za This test is not yet approved or cleared by the [place] FDA and has been authorized for detection and/or diagnosis of SARS-CoV-2 by FDA under an Emergency Use Authorization (EUA).  This EUA will remain in effect (meaning this test can be used) for the duration of the COVID-19 declaration under Section 564(b)(1) of the Act, 21 U.S.C. section 360bbb-3(b)(1), unless the authorization is terminated or revoked sooner. Performed at [facility], [address] [place], [place] [address]   MRSA PCR Screening     Status: Abnormal   Collection Time: 01/09/19  8:47 PM  Result Value Ref Range Status   MRSA by PCR POSITIVE (A) NEGATIVE Final    Comment:  The GeneXpert MRSA Assay (FDA approved for NASAL specimens only), is one component of a comprehensive MRSA colonization surveillance program. It is not intended to diagnose MRSA infection nor to guide or monitor treatment for MRSA infections. RESULT CALLED TO, READ BACK BY AND VERIFIED WITH: TETREAULT,H @ 0007 ON 01/10/19 BY JUW Performed at [facility], [address] [place], [place] [address]   Culture, respiratory     Status: None   Collection Time: 01/09/19 11:39 PM  Result Value Ref Range Status   Specimen Description   Final    TRACHEAL ASPIRATE Performed at [facility], [address]  [place], [place] [address]    Special Requests   Final    NONE Performed at [facility], [address] [place], [place] [address]    Gram Stain   Final    MODERATE WBC PRESENT, PREDOMINANTLY PMN RARE GRAM POSITIVE COCCI RARE YEAST Performed at [facility], [place] [address] [place], [place] [address]    Culture   Final    MODERATE METHICILLIN RESISTANT STAPHYLOCOCCUS AUREUS   Report Status 01/12/2019 FINAL  Final   Organism ID, Bacteria METHICILLIN RESISTANT STAPHYLOCOCCUS AUREUS  Final      Susceptibility   Methicillin resistant staphylococcus aureus - MIC*    CIPROFLOXACIN >=8 RESISTANT Resistant     ERYTHROMYCIN >=8 RESISTANT Resistant     GENTAMICIN <=0.5 SENSITIVE Sensitive     OXACILLIN >=4 RESISTANT Resistant     TETRACYCLINE <=1 SENSITIVE Sensitive     VANCOMYCIN 1 SENSITIVE Sensitive     TRIMETH/SULFA <=10 SENSITIVE Sensitive     CLINDAMYCIN <=0.25 SENSITIVE Sensitive     RIFAMPIN <=0.5 SENSITIVE Sensitive     Inducible Clindamycin NEGATIVE Sensitive     * MODERATE METHICILLIN RESISTANT STAPHYLOCOCCUS AUREUS         Radiology Studies: Dg Chest Port 1 View  Result Date: 01/13/2019 CLINICAL DATA:  85 year old male with respiratory failure, smoker. Negative for COVID-19 on 01/09/2019. EXAM: PORTABLE CHEST 1 VIEW COMPARISON:  01/12/2019 and earlier. FINDINGS: Portable AP semi upright view at at 0525 hours. Endotracheal tube tip between the level the clavicles and carina. Stable visible enteric tube and right PICC line. Stable lung volumes and ventilation with continued dense left lung base opacity obscuring the diaphragm and patchy/confluent multifocal right lower lung opacity. Stable increased interstitial markings in both lungs which might reflect vascular congestion on chronic lung disease. Stable cardiac size and mediastinal contours. Paucity of bowel gas in the upper abdomen. IMPRESSION: 1. Stable lines and tubes. 2. Stable ventilation with continued dense left lower  lobe collapse or consolidation and patchy right lung base opacity. 3. Suspect pulmonary vascular congestion superimposed on chronic interstitial lung disease. Electronically Signed   By: Genevie Ann M.D.   On: 01/13/2019 08:39   Dg Chest Port 1 View  Result Date: 01/12/2019 CLINICAL DATA:  85 year old male with a history PICC placement EXAM: PORTABLE CHEST 1 VIEW COMPARISON:  01/12/2019 FINDINGS: Interval placement of right upper extremity PICC which appears to terminate near the superior cavoatrial junction. Cardiomediastinal silhouette likely unchanged in size and contour. Heart borders partially obscured by lung/pleural disease. Persistently low lung volumes with mixed interstitial and airspace opacities most prominent at the lung bases. Obscuration left hemidiaphragm in the left heart border persists. No change in aeration. No pneumothorax. Endotracheal tube terminates 4.8 cm above the carina. Gastric tube terminates in the left upper quadrant. IMPRESSION: Interval placement of right upper extremity PICC which appears to terminate superior  cavoatrial junction. Similar appearance of mixed interstitial and airspace disease with likely left greater than right pleural effusion and associated atelectasis/consolidation. Unchanged endotracheal tube and gastric tube. Electronically Signed   By: Corrie Mckusick D.O.   On: 01/12/2019 17:01   [place] Ekg Site Rite  Result Date: 01/12/2019 If Site Rite image not attached, placement could not be confirmed due to current cardiac rhythm.     Scheduled Meds:  albuterol  2.5 mg Nebulization Q4H   chlorhexidine gluconate (MEDLINE KIT)  15 mL Mouth Rinse BID   Chlorhexidine Gluconate Cloth  6 each Topical Q0600   Chlorhexidine Gluconate Cloth  6 each Topical Daily   collagenase   Topical Daily   feeding supplement (VITAL HIGH PROTEIN)  1,000 mL Per Tube Q24H   hydrocortisone sod succinate (SOLU-CORTEF) inj  25 mg Intravenous Q12H   mouth rinse  15 mL Mouth Rinse  10 times per day   mupirocin ointment  1 application Nasal BID   sodium chloride flush  10-40 mL Intracatheter Q12H   Continuous Infusions:  sodium chloride Stopped (01/12/19 1717)   dexmedetomidine (PRECEDEX) IV infusion 0.3 mcg/kg/hr (01/14/19 1007)   famotidine (PEPCID) IV Stopped (01/14/19 5208)   vancomycin 1,750 mg (01/13/19 1232)     LOS: 5 days      Debbe Odea, MD [facility] Pager: [email or web address] Password [facility] 01/14/2019, 10:59 AM

## 2019-01-15 ENCOUNTER — Other Ambulatory Visit: Payer: Self-pay

## 2019-01-15 LAB — MAGNESIUM: Magnesium: 1.6 mg/dL — ABNORMAL LOW (ref 1.7–2.4)

## 2019-01-15 LAB — BASIC METABOLIC PANEL
Anion gap: 8 (ref 5–15)
BUN: 21 mg/dL (ref 8–23)
CO2: 28 mmol/L (ref 22–32)
Calcium: 8.2 mg/dL — ABNORMAL LOW (ref 8.9–10.3)
Chloride: 103 mmol/L (ref 98–111)
Creatinine, Ser: 0.48 mg/dL — ABNORMAL LOW (ref 0.61–1.24)
GFR calc Af Amer: >60 mL/min (ref >60)
GFR calc non Af Amer: >60 mL/min (ref >60)
Glucose, Bld: 81 mg/dL (ref 70–99)
Potassium: 3 mmol/L — ABNORMAL LOW (ref 3.5–5.1)
Sodium: 139 mmol/L (ref 135–145)

## 2019-01-15 MED ORDER — MAGNESIUM SULFATE 4 GM/100ML IV SOLN
4.0000 g | Freq: Once | INTRAVENOUS | Status: AC
  Administered 2019-01-15: 4 g via INTRAVENOUS
  Filled 2019-01-15: qty 100

## 2019-01-15 MED ORDER — POTASSIUM CHLORIDE 20 MEQ/15ML (10%) PO SOLN
40.0000 meq | ORAL | Status: AC
  Administered 2019-01-15 (×2): 40 meq via ORAL
  Filled 2019-01-15 (×2): qty 30

## 2019-01-15 NOTE — Evaluation (Signed)
Physical Therapy Evaluation Patient Details Name: John Munoz MRN: [id number] DOB: 1933-04-20 Today's Date: 01/15/2019   History of Present Illness   John Munoz is a 85 y.o. male with medical history significant for COPD, depression, BPH, brought to the ED with reports of altered mental status of 2 days duration.  Patient at baseline is alert and oriented and ambulates with a walker.  The time of my evaluation patient is obtunded, and not responding to voice and barely responding to pain.  Patient was recently admitted at [facility] rocking him.    Clinical Impression  Patient demonstrates poor return for using BUE to help sit up at bedside and unable to maintain standing balance due to weakness.  Patient required partial stand pivot to transfer to chair, to weak to use RW and tolerated sitting up in chair after therapy - RN notified.  Patient will benefit from continued physical therapy in hospital and recommended venue below to increase strength, balance, endurance for safe ADLs and gait.    Follow Up Recommendations SNF    Equipment Recommendations  None recommended by PT    Recommendations for Other Services       Precautions / Restrictions Precautions Precautions: Fall Restrictions Weight Bearing Restrictions: No      Mobility  Bed Mobility Overal bed mobility: Needs Assistance Bed Mobility: Supine to Sit     Supine to sit: Max assist        Transfers Overall transfer level: Needs assistance Equipment used: 1 person hand held assist Transfers: Sit to/from Stand;Stand Pivot Transfers Sit to Stand: Max assist;Total assist Stand pivot transfers: Max assist;Total assist       General transfer comment: patient unable to come to complete standing due to weakness  Ambulation/Gait                Stairs            Wheelchair Mobility    Modified Rankin (Stroke Patients Only)       Balance Overall balance assessment: Needs  assistance Sitting-balance support: Feet supported Sitting balance-Leahy Scale: Poor Sitting balance - Comments: frequent falling to the left Postural control: Left lateral lean   Standing balance-Leahy Scale: Poor Standing balance comment: hand held assist                             Pertinent Vitals/Pain Pain Assessment: Faces Faces Pain Scale: Hurts a little bit Pain Location: low back Pain Descriptors / Indicators: Aching;Discomfort Pain Intervention(s): Limited activity within patient's tolerance;Monitored during session    Home Living Family/patient expects to be discharged to:: Private residence Living Arrangements: Other relatives Available Help at Discharge: Family Type of Home: House Home Access: Stairs to enter Entrance Stairs-Rails: Right Entrance Stairs-Number of Steps: 3-4 Home Layout: One level Home Equipment: Environmental consultant - 2 wheels;Wheelchair - manual;Cane - single point Additional Comments: info per patient who may be a poor historian    Prior Function Level of Independence: Needs assistance   Gait / Transfers Assistance Needed: household ambulator with RW  ADL's / Homemaking Assistance Needed: assisted by his neice        Hand Dominance        Extremity/Trunk Assessment   Upper Extremity Assessment Upper Extremity Assessment: Generalized weakness    Lower Extremity Assessment Lower Extremity Assessment: Generalized weakness    Cervical / Trunk Assessment Cervical / Trunk Assessment: Kyphotic  Communication   Communication: Expressive difficulties(speaks very softly, hard  to understand)  Cognition Arousal/Alertness: Awake/alert Behavior During Therapy: WFL for tasks assessed/performed Overall Cognitive Status: Within Functional Limits for tasks assessed                                        General Comments      Exercises     Assessment/Plan    PT Assessment Patient needs continued PT services  PT Problem  List Decreased strength;Decreased activity tolerance;Decreased balance;Decreased mobility       PT Treatment Interventions Therapeutic exercise;Gait training;Stair training;Functional mobility training;Therapeutic activities;Patient/family education    PT Goals (Current goals can be found in the Care Plan section)  Acute Rehab PT Goals Patient Stated Goal: return home after rehab PT Goal Formulation: With patient Time For Goal Achievement: 01/29/19 Potential to Achieve Goals: Good    Frequency Min 3X/week   Barriers to discharge        Co-evaluation               AM-PAC PT "6 Clicks" Mobility  Outcome Measure Help needed turning from your back to your side while in a flat bed without using bedrails?: A Lot Help needed moving from lying on your back to sitting on the side of a flat bed without using bedrails?: A Lot Help needed moving to and from a bed to a chair (including a wheelchair)?: A Lot Help needed standing up from a chair using your arms (e.g., wheelchair or bedside chair)?: A Lot Help needed to walk in hospital room?: Total Help needed climbing 3-5 steps with a railing? : Total 6 Click Score: 10    End of Session Equipment Utilized During Treatment: Oxygen Activity Tolerance: Patient tolerated treatment well;Patient limited by fatigue Patient left: in chair;with call bell/phone within reach Nurse Communication: Mobility status PT Visit Diagnosis: Unsteadiness on feet (R26.81);Other abnormalities of gait and mobility (R26.89);Muscle weakness (generalized) (M62.81)    Time: 5051-8335 PT Time Calculation (min) (ACUTE ONLY): 33 min   Charges:   PT Evaluation $PT Eval Moderate Complexity: 1 Mod PT Treatments $Therapeutic Activity: 23-37 mins        11:46 AM, 01/15/19 Lonell Grandchild, MPT Physical Therapist with [facility] [phone] office [phone] mobile phone

## 2019-01-15 NOTE — Plan of Care (Signed)
  Problem: Acute Rehab PT Goals(only PT should resolve) Goal: Pt Will Go Supine/Side To Sit Outcome: Progressing Flowsheets (Taken 01/15/2019 1147) Pt will go Supine/Side to Sit: with moderate assist; with maximum assist Goal: Patient Will Transfer Sit To/From Stand Outcome: Progressing Flowsheets (Taken 01/15/2019 1147) Patient will transfer sit to/from stand: with moderate assist; with maximum assist Goal: Pt Will Transfer Bed To Chair/Chair To Bed Outcome: Progressing Flowsheets (Taken 01/15/2019 1147) Pt will Transfer Bed to Chair/Chair to Bed: with mod assist; with max assist Goal: Pt Will Ambulate Outcome: Progressing Flowsheets (Taken 01/15/2019 1147) Pt will Ambulate: 10 feet; with moderate assist; with maximum assist; with rolling walker   11:48 AM, 01/15/19 Lonell Grandchild, MPT Physical Therapist with [facility] 336 [phone] office [phone] mobile phone

## 2019-01-15 NOTE — Progress Notes (Signed)
PROGRESS NOTE    John Munoz   XMI:[id number]  DOB: [birth year]  DOA: 01/09/2019 PCP: Neale Burly, MD   Brief Narrative:  John Munoz is a 85 y.o. male from home with great neice with medical history significant for COPD, depression, BPH, recent right heel pressure ulcer with cellulitis and necrosis, recent UTI, brought to the ED with reports of altered mental status, lethargic of 2 days duration.  In ED, temp 91.1, tachypnea, CT chest> left > right lower lobe infiltrates. Started on  Vanc and Cefepime for HCAP and intubated in ED. COVID neg. MRSA screen +.  Admitted for HCAP, acute diastolic CHF, acute encephalopathy. Made DNR the following day. Venous duplex 01/10/19- no DVT   Subjective: He admits to being hungry. No other complaints.    Assessment & Plan:   Principal Problem:     Sepsis- hypothermia, hypotension   Acute respiratory failure with hypoxia due to HCAP and acute diastolic CHF - remains intubated and on Vanc, Flagyl and Cefepime- respiratory culture reveals moderate MRSA - would continue all three antibiotics for now - he has been weaned off of Levophed - d/c stress dose steroids - 2 D ECHO reveals normal EF- ? If he has diastolic dysfunction- I am trying to keep him on the dry side - he was successfully extubated yesterday-   Active Problems:  FEN - has passed swallow eval and will be started on a diet today  Anemia - Hb 7.2 on 5/1 for which he was transfused 2 U PRBC- anemia panel was consistent with AOCD and no GI bleed was noted  Electrolyte abnormalities - low K, Mag and phos- will continue to replace and follow     Abnormal liver function/ thrombocytopenia - suspected to be due to sepsis- Hepatitis panel negative - noted to be improving    Pressure injury of skin- stage 3 left heel - appreciate wound care eval - recommendations> Enzymatic debridement and saline moist gauze. Cover with dry dressing. Change daily.    Time spent in  minutes: 35 min DVT prophylaxis: SCDs Code Status: DNR-  Family Communication:  Disposition Plan: transfer to SDU- start search for SNF Consultants:   pulmonary Procedures:  2 D ECHO 1. The left ventricle has normal systolic function, with an ejection fraction of 55-60%. The cavity size was normal. There is mild concentric left ventricular hypertrophy. Left ventricular diastolic Doppler parameters are indeterminate. No evidence of  left ventricular regional wall motion abnormalities.  2. The right ventricle has normal systolic function. The cavity was normal. There is no increase in right ventricular wall thickness.  3. Left atrial size was mildly dilated.  4. The mitral valve is grossly normal. Mild thickening of the mitral valve leaflet. There is mild to moderate mitral annular calcification present.  5. The tricuspid valve is grossly normal.  6. The aortic valve is tricuspid. Mild thickening of the aortic valve. Mild calcification of the aortic valve. Aortic valve regurgitation is trivial by color flow Doppler.  7. Moderate aortic annular calcification.  8. The aortic root is normal in size and structure.  9. The inferior vena cava was dilated in size with <50% respiratory variability.  Antimicrobials:  Anti-infectives (From admission, onward)   Start     Dose/Rate Route Frequency Ordered Stop   01/13/19 1300  vancomycin (VANCOCIN) 1,750 mg in sodium chloride 0.9 % 500 mL IVPB     1,750 mg 250 mL/hr over 120 Minutes Intravenous Every 24 hours 01/12/19 1306  01/10/19 1230  vancomycin (VANCOCIN) 1,500 mg in sodium chloride 0.9 % 500 mL IVPB  Status:  Discontinued     1,500 mg 250 mL/hr over 120 Minutes Intravenous Every 24 hours 01/09/19 1230 01/10/19 0759   01/10/19 1230  vancomycin (VANCOCIN) 1,500 mg in sodium chloride 0.9 % 500 mL IVPB     1,500 mg 250 mL/hr over 120 Minutes Intravenous Every 24 hours 01/10/19 0800 01/12/19 1622   01/09/19 2000  ceFEPIme (MAXIPIME) 2 g in  sodium chloride 0.9 % 100 mL IVPB     2 g 200 mL/hr over 30 Minutes Intravenous Every 8 hours 01/09/19 1219 01/11/19 2211   01/09/19 1330  vancomycin (VANCOCIN) 500 mg in sodium chloride 0.9 % 100 mL IVPB     500 mg 100 mL/hr over 60 Minutes Intravenous  Once 01/09/19 1221 01/09/19 1502   01/09/19 1115  ceFEPIme (MAXIPIME) 2 g in sodium chloride 0.9 % 100 mL IVPB     2 g 200 mL/hr over 30 Minutes Intravenous  Once 01/09/19 1112 01/09/19 1214   01/09/19 1115  metroNIDAZOLE (FLAGYL) IVPB 500 mg     500 mg 100 mL/hr over 60 Minutes Intravenous  Once 01/09/19 1112 01/09/19 1239   01/09/19 1115  vancomycin (VANCOCIN) IVPB 1000 mg/200 mL premix     1,000 mg 200 mL/hr over 60 Minutes Intravenous  Once 01/09/19 1112 01/09/19 1336       Objective: Vitals:   01/15/19 0700 01/15/19 0812 01/15/19 0900 01/15/19 1000  BP:   (!) 129/58 129/68  Pulse:   93 94  Resp:   (!) 24 (!) 30  Temp:   99.5 F (37.5 C) 99.6 F (37.6 C)  TempSrc: Core     SpO2:  97% 92% 92%  Weight:      Height:        Intake/Output Summary (Last 24 hours) at 01/15/2019 1152 Last data filed at 01/15/2019 1000 Gross per 24 hour  Intake 665.55 ml  Output 1400 ml  Net -734.45 ml   Filed Weights   01/13/19 0500 01/14/19 0500 01/15/19 0500  Weight: 75.5 kg 75.2 kg 75.5 kg    Examination: General exam: Appears comfortable  HEENT: PERRLA, oral mucosa moist, no sclera icterus or thrush Respiratory system: Clear to auscultation. Respiratory effort normal. Cardiovascular system: S1 & S2 heard,  No murmurs  Gastrointestinal system: Abdomen soft, non-tender, nondistended. Normal bowel sounds   Central nervous system: Alert and oriented. No focal neurological deficits. Extremities: No cyanosis, clubbing or edema Skin: No rashes or ulcers Psychiatry:  Mood & affect appropriate.       Data Reviewed: I have personally reviewed following labs and imaging studies  CBC: Recent Labs  Lab 01/09/19 1113  01/11/19 0419  01/12/19 0525 01/13/19 0458 01/14/19 0424 01/14/19 0812  WBC 3.7*   < > 3.6* 10.1 7.1 5.2 5.0  NEUTROABS 2.9  --   --   --   --   --   --   HGB 8.0*   < > 9.9* 11.0* 10.3* 9.6* 9.1*  HCT 23.7*   < > 29.9* 32.7* 30.2* 28.6* 28.0*  MCV 100.0   < > 94.9 94.2 93.2 95.3 96.9  PLT 101*   < > 65* 63* 66* 71* 72*   < > = values in this interval not displayed.   Basic Metabolic Panel: Recent Labs  Lab 01/12/19 0525  01/13/19 0458 01/13/19 0859 01/13/19 1752 01/14/19 0424 01/14/19 0812 01/14/19 1130 01/15/19 0448  NA 140   < >  --  138 134* 137 138  --  139  K 3.0*   < >  --  2.7* 3.3* 3.2* 3.4*  --  3.0*  CL 105   < >  --  97* 97* 99 100  --  103  CO2 25   < >  --  29 29 30 30   --  28  GLUCOSE 186*   < >  --  120* 148* 125* 132*  --  81  BUN 22   < >  --  17 19 23 23   --  21  CREATININE 0.59*   < >  --  0.60* 0.52* 0.49*  0.55* 0.51*  --  0.48*  CALCIUM 9.0   < >  --  8.5* 8.0* 8.3* 8.3*  --  8.2*  MG 1.6*  --  1.6* 1.6*  --   --   --  1.9 1.6*  PHOS 1.7*  --  3.2 3.4  --   --   --   --   --    < > = values in this interval not displayed.   GFR: Estimated Creatinine Clearance: 67.7 mL/min (A) (by C-G formula based on SCr of 0.48 mg/dL (L)). Liver Function Tests: Recent Labs  Lab 01/09/19 1113 01/10/19 0413 01/11/19 0419 01/12/19 0525 01/12/19 1708  AST 63* 72* 87* 56* 44*  ALT 49* 47* 53* 49* 44  ALKPHOS 106 95 99 92 90  BILITOT 0.5 0.5 1.0 0.7 1.0  PROT 6.0* 5.4* 5.9* 5.9* 5.7*  ALBUMIN 3.0* 2.7* 2.9* 2.7* 2.6*   No results for input(s): LIPASE, AMYLASE in the last 168 hours. No results for input(s): AMMONIA in the last 168 hours. Coagulation Profile: Recent Labs  Lab 01/10/19 0935  INR 1.1   Cardiac Enzymes: Recent Labs  Lab 01/09/19 1516 01/10/19 0413 01/10/19 0546 01/10/19 0935 01/10/19 1516 01/10/19 2003  CKTOTAL 551*  --   --   --   --   --   TROPONINI <0.03 0.46* 0.54* 0.78* 0.89* 0.93*   BNP (last 3 results) No results for input(s): PROBNP in  the last 8760 hours. HbA1C: No results for input(s): HGBA1C in the last 72 hours. CBG: Recent Labs  Lab 01/13/19 2110 01/13/19 2352 01/14/19 0431 01/14/19 0744 01/14/19 1107  GLUCAP 132* 129* 132* 132* 116*   Lipid Profile: No results for input(s): CHOL, HDL, LDLCALC, TRIG, CHOLHDL, LDLDIRECT in the last 72 hours. Thyroid Function Tests: No results for input(s): TSH, T4TOTAL, FREET4, T3FREE, THYROIDAB in the last 72 hours. Anemia Panel: No results for input(s): VITAMINB12, FOLATE, FERRITIN, TIBC, IRON, RETICCTPCT in the last 72 hours. Urine analysis:    Component Value Date/Time   COLORURINE YELLOW 01/09/2019 1114   APPEARANCEUR CLEAR 01/09/2019 1114   LABSPEC 1.017 01/09/2019 1114   PHURINE 5.0 01/09/2019 1114   GLUCOSEU NEGATIVE 01/09/2019 1114   [place] 01/09/2019 1114   [place] 01/09/2019 1114   [place] 01/09/2019 1114   PROTEINUR NEGATIVE 01/09/2019 1114   NITRITE NEGATIVE 01/09/2019 1114   LEUKOCYTESUR TRACE (A) 01/09/2019 1114   Sepsis Labs: @LABRCNTIP (procalcitonin:4,lacticidven:4) ) Recent Results (from the past 240 hour(s))  Blood Culture (routine x 2)     Status: None   Collection Time: 01/09/19 11:13 AM  Result Value Ref Range Status   Specimen Description BLOOD LEFT ARM BOTTLES DRAWN AEROBIC AND ANAEROBIC  Final   Special Requests Blood Culture adequate volume  Final   Culture   Final    NO GROWTH 5 DAYS  Performed at [facility], [address] [place], [place] [address]    Report Status 01/14/2019 FINAL  Final  Blood Culture (routine x 2)     Status: None   Collection Time: 01/09/19 11:35 AM  Result Value Ref Range Status   Specimen Description   Final    LEFT ANTECUBITAL BOTTLES DRAWN AEROBIC AND ANAEROBIC   Special Requests Blood Culture adequate volume  Final   Culture   Final    NO GROWTH 5 DAYS Performed at [facility], [address] [place], [place] [address]    Report Status 01/14/2019 FINAL  Final  SARS  Coronavirus 2 (CEPHEID - Performed in [place] hospital lab), Hosp Order     Status: None   Collection Time: 01/09/19  2:58 PM  Result Value Ref Range Status   SARS Coronavirus 2 NEGATIVE NEGATIVE Final    Comment: (NOTE) If result is NEGATIVE SARS-CoV-2 target nucleic acids are NOT DETECTED. The SARS-CoV-2 RNA is generally detectable in upper and lower  respiratory specimens during the acute phase of infection. The lowest  concentration of SARS-CoV-2 viral copies this assay can detect is 250  copies / mL. A negative result does not preclude SARS-CoV-2 infection  and should not be used as the sole basis for treatment or other  patient management decisions.  A negative result may occur with  improper specimen collection / handling, submission of specimen other  than nasopharyngeal swab, presence of viral mutation(s) within the  areas targeted by this assay, and inadequate number of viral copies  (<250 copies / mL). A negative result must be combined with clinical  observations, patient history, and epidemiological information. If result is POSITIVE SARS-CoV-2 target nucleic acids are DETECTED. The SARS-CoV-2 RNA is generally detectable in upper and lower  respiratory specimens dur ing the acute phase of infection.  Positive  results are indicative of active infection with SARS-CoV-2.  Clinical  correlation with patient history and other diagnostic information is  necessary to determine patient infection status.  Positive results do  not rule out bacterial infection or co-infection with other viruses. If result is PRESUMPTIVE POSTIVE SARS-CoV-2 nucleic acids MAY BE PRESENT.   A presumptive positive result was obtained on the submitted specimen  and confirmed on repeat testing.  While 2019 novel coronavirus  (SARS-CoV-2) nucleic acids may be present in the submitted sample  additional confirmatory testing may be necessary for epidemiological  and / or clinical management purposes  to  differentiate between  SARS-CoV-2 and other Sarbecovirus currently known to infect humans.  If clinically indicated additional testing with an alternate test  methodology [phone]) is advised. The SARS-CoV-2 RNA is generally  detectable in upper and lower respiratory sp ecimens during the acute  phase of infection. The expected result is Negative. Fact Sheet for Patients:  StrictlyIdeas.no Fact Sheet for Healthcare Providers: BankingDealers.co.za This test is not yet approved or cleared by the [place] FDA and has been authorized for detection and/or diagnosis of SARS-CoV-2 by FDA under an Emergency Use Authorization (EUA).  This EUA will remain in effect (meaning this test can be used) for the duration of the COVID-19 declaration under Section 564(b)(1) of the Act, 21 U.S.C. section 360bbb-3(b)(1), unless the authorization is terminated or revoked sooner. Performed at [facility], [address] [place], [place] [address]   MRSA PCR Screening     Status: Abnormal   Collection Time: 01/09/19  8:47 PM  Result Value Ref Range Status   MRSA by PCR  POSITIVE (A) NEGATIVE Final    Comment:        The GeneXpert MRSA Assay (FDA approved for NASAL specimens only), is one component of a comprehensive MRSA colonization surveillance program. It is not intended to diagnose MRSA infection nor to guide or monitor treatment for MRSA infections. RESULT CALLED TO, READ BACK BY AND VERIFIED WITH: TETREAULT,H @ 0007 ON 01/10/19 BY JUW Performed at [facility], [address], [place], [place] [address]   Culture, respiratory     Status: None   Collection Time: 01/09/19 11:39 PM  Result Value Ref Range Status   Specimen Description   Final    TRACHEAL ASPIRATE Performed at [facility], [address], [place], [place] [address]    Special Requests   Final    NONE Performed at [facility], [address], [place], [place] [address]    Gram  Stain   Final    MODERATE WBC PRESENT, PREDOMINANTLY PMN RARE GRAM POSITIVE COCCI RARE YEAST Performed at [facility], [place] [address] [place], [place] [address]    Culture   Final    MODERATE METHICILLIN RESISTANT STAPHYLOCOCCUS AUREUS   Report Status 01/12/2019 FINAL  Final   Organism ID, Bacteria METHICILLIN RESISTANT STAPHYLOCOCCUS AUREUS  Final      Susceptibility   Methicillin resistant staphylococcus aureus - MIC*    CIPROFLOXACIN >=8 RESISTANT Resistant     ERYTHROMYCIN >=8 RESISTANT Resistant     GENTAMICIN <=0.5 SENSITIVE Sensitive     OXACILLIN >=4 RESISTANT Resistant     TETRACYCLINE <=1 SENSITIVE Sensitive     VANCOMYCIN 1 SENSITIVE Sensitive     TRIMETH/SULFA <=10 SENSITIVE Sensitive     CLINDAMYCIN <=0.25 SENSITIVE Sensitive     RIFAMPIN <=0.5 SENSITIVE Sensitive     Inducible Clindamycin NEGATIVE Sensitive     * MODERATE METHICILLIN RESISTANT STAPHYLOCOCCUS AUREUS         Radiology Studies: No results found.    Scheduled Meds: . acetylcysteine  3 mL Nebulization Q4H  . albuterol  2.5 mg Nebulization Q4H  . Chlorhexidine Gluconate Cloth  6 each Topical Q0600  . collagenase   Topical Daily  . potassium chloride  40 mEq Oral Q4H  . sodium chloride flush  10-40 mL Intracatheter Q12H   Continuous Infusions: . sodium chloride Stopped (01/12/19 1717)  . vancomycin Stopped (01/14/19 1630)     LOS: 6 days      Debbe Odea, MD [facility] Pager: [email or web address] Password [facility] 01/15/2019, 11:52 AM

## 2019-01-15 NOTE — Progress Notes (Signed)
Subjective: He was able to be extubated yesterday.  He is done well postextubation.  He is able to talk and is more alert.  Objective: Vital signs in last 24 hours: Temp:  [98 F (36.7 C)-100 F (37.8 C)] 99.2 F (37.3 C) (05/16 0600) Pulse Rate:  [71-88] 88 (05/16 0600) Resp:  [17-28] 23 (05/16 0600) BP: (111-150)/(56-97) 136/69 (05/16 0600) SpO2:  [93 %-99 %] 97 % (05/16 0812) FiO2 (%):  [35 %-40 %] 35 % (05/15 1559) Weight:  [75.5 kg] 75.5 kg (05/16 0500) Weight change: 0.3 kg Last BM Date: 01/10/19  Intake/Output from previous day: 05/15 0701 - 05/16 0700 In: 693.4 [I.V.:93.3; IV Piggyback:600] Out: 1400 [Urine:1400]  PHYSICAL EXAM General appearance: alert, cooperative and no distress Resp: rhonchi bilaterally Cardio: regular rate and rhythm, S1, S2 normal, no murmur, click, rub or gallop GI: soft, non-tender; bowel sounds normal; no masses,  no organomegaly Extremities: extremities normal, atraumatic, no cyanosis or edema  Lab Results:  Results for orders placed or performed during the hospital encounter of 01/09/19 (from the past 48 hour(s))  Basic metabolic panel     Status: Abnormal   Collection Time: 01/13/19  8:59 AM  Result Value Ref Range   Sodium 138 135 - 145 mmol/L   Potassium 2.7 (LL) 3.5 - 5.1 mmol/L    Comment: CRITICAL RESULT CALLED TO, READ BACK BY AND VERIFIED WITH: JONES J. AT 0939A ON [id number] BY THOMPSON S.    Chloride 97 (L) 98 - 111 mmol/L   CO2 29 22 - 32 mmol/L   Glucose, Bld 120 (H) 70 - 99 mg/dL   BUN 17 8 - 23 mg/dL   Creatinine, Ser 0.60 (L) 0.61 - 1.24 mg/dL   Calcium 8.5 (L) 8.9 - 10.3 mg/dL   GFR calc non Af Amer >60 >60 mL/min   GFR calc Af Amer >60 >60 mL/min   Anion gap 12 5 - 15    Comment: Performed at [facility], [address] [place], [place] [address]  Magnesium     Status: Abnormal   Collection Time: 01/13/19  8:59 AM  Result Value Ref Range   Magnesium 1.6 (L) 1.7 - 2.4 mg/dL    Comment: Performed at [facility], [address], [place], [place] [address]  Phosphorus     Status: None   Collection Time: 01/13/19  8:59 AM  Result Value Ref Range   Phosphorus 3.4 2.5 - 4.6 mg/dL    Comment: Performed at [facility], [address], [place], [place] [address]  Glucose, capillary     Status: Abnormal   Collection Time: 01/13/19 11:23 AM  Result Value Ref Range   Glucose-Capillary 110 (H) 70 - 99 mg/dL  Glucose, capillary     Status: Abnormal   Collection Time: 01/13/19  4:39 PM  Result Value Ref Range   Glucose-Capillary 147 (H) 70 - 99 mg/dL   Comment 1 Notify RN    Comment 2 Document in Chart   Basic metabolic panel     Status: Abnormal   Collection Time: 01/13/19  5:52 PM  Result Value Ref Range   Sodium 134 (L) 135 - 145 mmol/L   Potassium 3.3 (L) 3.5 - 5.1 mmol/L    Comment: DELTA CHECK NOTED   Chloride 97 (L) 98 - 111 mmol/L   CO2 29 22 - 32 mmol/L   Glucose, Bld 148 (H) 70 - 99 mg/dL   BUN 19 8 - 23 mg/dL   Creatinine, Ser 0.52 (L)  0.61 - 1.24 mg/dL   Calcium 8.0 (L) 8.9 - 10.3 mg/dL   GFR calc non Af Amer >60 >60 mL/min   GFR calc Af Amer >60 >60 mL/min   Anion gap 8 5 - 15    Comment: Performed at [facility], [address] [place], [place] [address]  Glucose, capillary     Status: Abnormal   Collection Time: 01/13/19  9:10 PM  Result Value Ref Range   Glucose-Capillary 132 (H) 70 - 99 mg/dL   Comment 1 Notify RN    Comment 2 Document in Chart   Glucose, capillary     Status: Abnormal   Collection Time: 01/13/19 11:52 PM  Result Value Ref Range   Glucose-Capillary 129 (H) 70 - 99 mg/dL   Comment 1 Notify RN    Comment 2 Document in Chart   Creatinine, serum     Status: Abnormal   Collection Time: 01/14/19  4:24 AM  Result Value Ref Range   Creatinine, Ser 0.55 (L) 0.61 - 1.24 mg/dL   GFR calc non Af Amer >60 >60 mL/min   GFR calc Af Amer >60 >60 mL/min    Comment: Performed at [facility], [address] [place], [place] [address]  CBC     Status: Abnormal    Collection Time: 01/14/19  4:24 AM  Result Value Ref Range   WBC 5.2 4.0 - 10.5 K/uL   RBC 3.00 (L) 4.22 - 5.81 MIL/uL   Hemoglobin 9.6 (L) 13.0 - 17.0 g/dL   HCT 28.6 (L) 39.0 - 52.0 %   MCV 95.3 80.0 - 100.0 fL   MCH 32.0 26.0 - 34.0 pg   MCHC 33.6 30.0 - 36.0 g/dL   RDW 16.7 (H) 11.5 - 15.5 %   Platelets 71 (L) 150 - 400 K/uL    Comment: REPEATED TO VERIFY SPECIMEN CHECKED FOR CLOTS Immature Platelet Fraction may be clinically indicated, consider ordering this additional test [id number] CONSISTENT WITH PREVIOUS RESULT    nRBC 0.0 0.0 - 0.2 %    Comment: Performed at [facility], [address], [place],  [address]  Basic metabolic panel     Status: Abnormal   Collection Time: 01/14/19  4:24 AM  Result Value Ref Range   Sodium 137 135 - 145 mmol/L   Potassium 3.2 (L) 3.5 - 5.1 mmol/L   Chloride 99 98 - 111 mmol/L   CO2 30 22 - 32 mmol/L   Glucose, Bld 125 (H) 70 - 99 mg/dL   BUN 23 8 - 23 mg/dL   Creatinine, Ser 0.49 (L) 0.61 - 1.24 mg/dL   Calcium 8.3 (L) 8.9 - 10.3 mg/dL   GFR calc non Af Amer >60 >60 mL/min   GFR calc Af Amer >60 >60 mL/min   Anion gap 8 5 - 15    Comment: Performed at [facility], [address] [place], [place] [address]  Glucose, capillary     Status: Abnormal   Collection Time: 01/14/19  4:31 AM  Result Value Ref Range   Glucose-Capillary 132 (H) 70 - 99 mg/dL   Comment 1 Notify RN    Comment 2 Document in Chart   Blood gas, arterial     Status: Abnormal   Collection Time: 01/14/19  5:00 AM  Result Value Ref Range   FIO2 40.00    pH, Arterial 7.528 (H) 7.350 - 7.450   pCO2 arterial 38.2 32.0 - 48.0 mmHg   pO2, Arterial 90.5 83.0 - 108.0 mmHg  Bicarbonate 32.2 (H) 20.0 - 28.0 mmol/L   Acid-Base Excess 8.3 (H) 0.0 - 2.0 mmol/L   O2 Saturation 97.3 %   Patient temperature 37.0    Allens test (pass/fail) PASS PASS    Comment: Performed at [facility], [address] [place], [place] [address]  Glucose, capillary     Status: Abnormal    Collection Time: 01/14/19  7:44 AM  Result Value Ref Range   Glucose-Capillary 132 (H) 70 - 99 mg/dL  Basic metabolic panel     Status: Abnormal   Collection Time: 01/14/19  8:12 AM  Result Value Ref Range   Sodium 138 135 - 145 mmol/L   Potassium 3.4 (L) 3.5 - 5.1 mmol/L   Chloride 100 98 - 111 mmol/L   CO2 30 22 - 32 mmol/L   Glucose, Bld 132 (H) 70 - 99 mg/dL   BUN 23 8 - 23 mg/dL   Creatinine, Ser 0.51 (L) 0.61 - 1.24 mg/dL   Calcium 8.3 (L) 8.9 - 10.3 mg/dL   GFR calc non Af Amer >60 >60 mL/min   GFR calc Af Amer >60 >60 mL/min   Anion gap 8 5 - 15    Comment: Performed at [facility], [address], [place], [place] [address]  CBC     Status: Abnormal   Collection Time: 01/14/19  8:12 AM  Result Value Ref Range   WBC 5.0 4.0 - 10.5 K/uL   RBC 2.89 (L) 4.22 - 5.81 MIL/uL   Hemoglobin 9.1 (L) 13.0 - 17.0 g/dL   HCT 28.0 (L) 39.0 - 52.0 %   MCV 96.9 80.0 - 100.0 fL   MCH 31.5 26.0 - 34.0 pg   MCHC 32.5 30.0 - 36.0 g/dL   RDW 16.5 (H) 11.5 - 15.5 %   Platelets 72 (L) 150 - 400 K/uL    Comment: SPECIMEN CHECKED FOR CLOTS Immature Platelet Fraction may be clinically indicated, consider ordering this additional test [id number] CONSISTENT WITH PREVIOUS RESULT    nRBC 0.0 0.0 - 0.2 %    Comment: Performed at [facility], [address] [place], [place] [address]  Glucose, capillary     Status: Abnormal   Collection Time: 01/14/19 11:07 AM  Result Value Ref Range   Glucose-Capillary 116 (H) 70 - 99 mg/dL  Magnesium     Status: None   Collection Time: 01/14/19 11:30 AM  Result Value Ref Range   Magnesium 1.9 1.7 - 2.4 mg/dL    Comment: Performed at [facility], [address], [place], [place] [address]  Blood gas, arterial     Status: Abnormal   Collection Time: 01/14/19  2:03 PM  Result Value Ref Range   FIO2 40.00    pH, Arterial 7.527 (H) 7.350 - 7.450   pCO2 arterial 38.8 32.0 - 48.0 mmHg   pO2, Arterial 81.4 (L) 83.0 - 108.0 mmHg   Bicarbonate 32.4 (H) 20.0  - 28.0 mmol/L   Acid-Base Excess 8.7 (H) 0.0 - 2.0 mmol/L   O2 Saturation 96.0 %   Patient temperature 37.8    Sample type LEFT RADIAL    Allens test (pass/fail) PASS PASS    Comment: Performed at [facility], [address], [place], [place] [address]  Basic metabolic panel     Status: Abnormal   Collection Time: 01/15/19  4:48 AM  Result Value Ref Range   Sodium 139 135 - 145 mmol/L   Potassium 3.0 (L) 3.5 - 5.1 mmol/L   Chloride 103 98 -  111 mmol/L   CO2 28 22 - 32 mmol/L   Glucose, Bld 81 70 - 99 mg/dL   BUN 21 8 - 23 mg/dL   Creatinine, Ser 0.48 (L) 0.61 - 1.24 mg/dL   Calcium 8.2 (L) 8.9 - 10.3 mg/dL   GFR calc non Af Amer >60 >60 mL/min   GFR calc Af Amer >60 >60 mL/min   Anion gap 8 5 - 15    Comment: Performed at [facility], [address] [place], [place] [address]  Magnesium     Status: Abnormal   Collection Time: 01/15/19  4:48 AM  Result Value Ref Range   Magnesium 1.6 (L) 1.7 - 2.4 mg/dL    Comment: Performed at [facility], [address], [place], [place] [address]    ABGS Recent Labs    01/14/19 1403  PHART 7.527*  PO2ART 81.4*  HCO3 32.4*   CULTURES Recent Results (from the past 240 hour(s))  Blood Culture (routine x 2)     Status: None   Collection Time: 01/09/19 11:13 AM  Result Value Ref Range Status   Specimen Description BLOOD LEFT ARM BOTTLES DRAWN AEROBIC AND ANAEROBIC  Final   Special Requests Blood Culture adequate volume  Final   Culture   Final    NO GROWTH 5 DAYS Performed at [facility], [address], [place], [place] [address]    Report Status 01/14/2019 FINAL  Final  Blood Culture (routine x 2)     Status: None   Collection Time: 01/09/19 11:35 AM  Result Value Ref Range Status   Specimen Description   Final    LEFT ANTECUBITAL BOTTLES DRAWN AEROBIC AND ANAEROBIC   Special Requests Blood Culture adequate volume  Final   Culture   Final    NO GROWTH 5 DAYS Performed at [facility], [address], [place], [place]  [address]    Report Status 01/14/2019 FINAL  Final  SARS Coronavirus 2 (CEPHEID - Performed in [place] hospital lab), Hosp Order     Status: None   Collection Time: 01/09/19  2:58 PM  Result Value Ref Range Status   SARS Coronavirus 2 NEGATIVE NEGATIVE Final    Comment: (NOTE) If result is NEGATIVE SARS-CoV-2 target nucleic acids are NOT DETECTED. The SARS-CoV-2 RNA is generally detectable in upper and lower  respiratory specimens during the acute phase of infection. The lowest  concentration of SARS-CoV-2 viral copies this assay can detect is 250  copies / mL. A negative result does not preclude SARS-CoV-2 infection  and should not be used as the sole basis for treatment or other  patient management decisions.  A negative result may occur with  improper specimen collection / handling, submission of specimen other  than nasopharyngeal swab, presence of viral mutation(s) within the  areas targeted by this assay, and inadequate number of viral copies  (<250 copies / mL). A negative result must be combined with clinical  observations, patient history, and epidemiological information. If result is POSITIVE SARS-CoV-2 target nucleic acids are DETECTED. The SARS-CoV-2 RNA is generally detectable in upper and lower  respiratory specimens dur ing the acute phase of infection.  Positive  results are indicative of active infection with SARS-CoV-2.  Clinical  correlation with patient history and other diagnostic information is  necessary to determine patient infection status.  Positive results do  not rule out bacterial infection or co-infection with other viruses. If result is PRESUMPTIVE POSTIVE SARS-CoV-2 nucleic acids MAY BE PRESENT.   A  presumptive positive result was obtained on the submitted specimen  and confirmed on repeat testing.  While 2019 novel coronavirus  (SARS-CoV-2) nucleic acids may be present in the submitted sample  additional confirmatory testing may be necessary for  epidemiological  and / or clinical management purposes  to differentiate between  SARS-CoV-2 and other Sarbecovirus currently known to infect humans.  If clinically indicated additional testing with an alternate test  methodology [phone]) is advised. The SARS-CoV-2 RNA is generally  detectable in upper and lower respiratory sp ecimens during the acute  phase of infection. The expected result is Negative. Fact Sheet for Patients:  StrictlyIdeas.no Fact Sheet for Healthcare Providers: BankingDealers.co.za This test is not yet approved or cleared by the [place] FDA and has been authorized for detection and/or diagnosis of SARS-CoV-2 by FDA under an Emergency Use Authorization (EUA).  This EUA will remain in effect (meaning this test can be used) for the duration of the COVID-19 declaration under Section 564(b)(1) of the Act, 21 U.S.C. section 360bbb-3(b)(1), unless the authorization is terminated or revoked sooner. Performed at [facility], [address] [place], [place] [address]   MRSA PCR Screening     Status: Abnormal   Collection Time: 01/09/19  8:47 PM  Result Value Ref Range Status   MRSA by PCR POSITIVE (A) NEGATIVE Final    Comment:        The GeneXpert MRSA Assay (FDA approved for NASAL specimens only), is one component of a comprehensive MRSA colonization surveillance program. It is not intended to diagnose MRSA infection nor to guide or monitor treatment for MRSA infections. RESULT CALLED TO, READ BACK BY AND VERIFIED WITH: TETREAULT,H @ 0007 ON 01/10/19 BY JUW Performed at [facility], [address] [place], [place] [address]   Culture, respiratory     Status: None   Collection Time: 01/09/19 11:39 PM  Result Value Ref Range Status   Specimen Description   Final    TRACHEAL ASPIRATE Performed at [facility], [address], [place], [place] [address]    Special Requests   Final    NONE Performed at [facility], [address] [place], [place] [address]    Gram Stain   Final    MODERATE WBC PRESENT, PREDOMINANTLY PMN RARE GRAM POSITIVE COCCI RARE YEAST Performed at [facility], [place] [address], [place], [place] [address]    Culture   Final    MODERATE METHICILLIN RESISTANT STAPHYLOCOCCUS AUREUS   Report Status 01/12/2019 FINAL  Final   Organism ID, Bacteria METHICILLIN RESISTANT STAPHYLOCOCCUS AUREUS  Final      Susceptibility   Methicillin resistant staphylococcus aureus - MIC*    CIPROFLOXACIN >=8 RESISTANT Resistant     ERYTHROMYCIN >=8 RESISTANT Resistant     GENTAMICIN <=0.5 SENSITIVE Sensitive     OXACILLIN >=4 RESISTANT Resistant     TETRACYCLINE <=1 SENSITIVE Sensitive     VANCOMYCIN 1 SENSITIVE Sensitive     TRIMETH/SULFA <=10 SENSITIVE Sensitive     CLINDAMYCIN <=0.25 SENSITIVE Sensitive     RIFAMPIN <=0.5 SENSITIVE Sensitive     Inducible Clindamycin NEGATIVE Sensitive     * MODERATE METHICILLIN RESISTANT STAPHYLOCOCCUS AUREUS   Studies/Results: No results found.  Medications:  Prior to Admission:  Medications Prior to Admission  Medication Sig Dispense Refill Last Dose  . acetaminophen (TYLENOL) 500 MG tablet Take 1,000 mg by mouth every 6 (six) hours as needed. Pain   Past Week at Unknown  . allopurinol (ZYLOPRIM) 300  MG tablet Take 300 mg by mouth daily.   01/08/2019 at Unknown time  . aspirin 81 MG chewable tablet Chew 81 mg by mouth daily.   01/08/2019 at 0930  . busPIRone (BUSPAR) 15 MG tablet Take 15 mg by mouth 2 (two) times daily.   01/08/2019 at Unknown time  . calcium gluconate 500 MG tablet Take 1 tablet by mouth daily.   01/08/2019 at Unknown time  . citalopram (CELEXA) 20 MG tablet Take 20 mg by mouth daily.   01/08/2019 at Unknown time  . folic acid (FOLVITE) 1 MG tablet Take 1 mg by mouth daily.   01/08/2019 at Unknown time  . gabapentin (NEURONTIN) 300 MG capsule Take 300 mg by mouth 3 (three) times daily.   01/08/2019 at Unknown time  . Melatonin 5 MG  TABS Take 1 tablet by mouth at bedtime.   01/08/2019 at Unknown time  . midodrine (PROAMATINE) 2.5 MG tablet Take 2.5 mg by mouth 2 (two) times a day.   01/08/2019 at Unknown time  . Omega-3 Fatty Acids (FISH OIL) 1000 MG CAPS Take 1 capsule by mouth 2 (two) times a day.   01/08/2019 at Unknown time  . omeprazole (PRILOSEC) 20 MG capsule Take 20 mg by mouth daily.   01/08/2019 at Unknown time  . Tamsulosin HCl (FLOMAX) 0.4 MG CAPS Take 0.4 mg by mouth daily.    01/08/2019 at Unknown time  . traMADol (ULTRAM) 50 MG tablet Take 50 mg by mouth 3 (three) times daily.   01/08/2019 at Unknown time  . vitamin B-12 (CYANOCOBALAMIN) 1000 MCG tablet Take 1,000 mcg by mouth daily.   01/08/2019 at Unknown time  . ciprofloxacin (CIPRO) 500 MG tablet Take 500 mg by mouth 2 (two) times daily.   Completed Course at Unknown time   Scheduled: . acetylcysteine  3 mL Nebulization Q4H  . albuterol  2.5 mg Nebulization Q4H  . Chlorhexidine Gluconate Cloth  6 each Topical Q0600  . collagenase   Topical Daily  . potassium chloride  40 mEq Oral Q4H  . sodium chloride flush  10-40 mL Intracatheter Q12H   Continuous: . sodium chloride Stopped (01/12/19 1717)  . dexmedetomidine (PRECEDEX) IV infusion Stopped (01/14/19 1640)  . famotidine (PEPCID) IV 20 mg (01/15/19 0913)  . magnesium sulfate bolus IVPB    . vancomycin Stopped (01/14/19 1630)   WNU:UVOZDG chloride, acetaminophen, fentaNYL (SUBLIMAZE) injection, fentaNYL (SUBLIMAZE) injection, hydrALAZINE, ipratropium-albuterol, midazolam, midazolam, ondansetron (ZOFRAN) IV, sodium chloride flush  Assesment: He was admitted with sepsis and required pressor support for a short period of time.  He has healthcare associated pneumonia.  He had acute on chronic hypoxic and hypercapnic respiratory failure requiring ventilator support.  He was able to be extubated yesterday.  He is clearly improving as far as the pneumonia is concerned.  He no longer has sepsis pathophysiology.  He has  had elevated liver functions and thrombocytopenia probably related to sepsis.    He had significant change in mental status but is better now.  He had heart failure and that is better after treatment with Lasix likely had some volume overload from fluid resuscitation  He had been in the hospital with a pressure injury to his heel and he had cellulitis from that and that does appear to be improving Principal Problem:   HCAP (healthcare-associated pneumonia) Active Problems:   Sepsis with acute hypoxic respiratory failure ([place])   Hypotension   Abnormal liver function   Pressure injury of skin   Sepsis due  to undetermined organism ([place])   Acute respiratory failure with hypoxia (H[facility])   Thrombocytopenia (H[facility])   Acute CHF (congestive heart failure) (HCC)   Altered mental status   Acute metabolic encephalopathy   Goals of care, counseling/discussion   Palliative care encounter   Pancytopenia ([place])   Pneumonia of left lower lobe due to infectious organism [facility])    Plan: Continue current treatments.  He is improving    LOS: 6 days   John Munoz 01/15/2019, 8:40 AM

## 2019-01-15 NOTE — Evaluation (Signed)
Clinical/Bedside Swallow Evaluation Patient Details  Name: John Munoz MRN: [id number] Date of Birth: [birth year]  Today's Date: 01/15/2019 Time: SLP Start Time (ACUTE ONLY): 32 SLP Stop Time (ACUTE ONLY): 1110 SLP Time Calculation (min) (ACUTE ONLY): 20 min  Past Medical History:  Past Medical History:  Diagnosis Date  . Arthritis   . BPH (benign prostatic hyperplasia)   . Depression   . Gout   . Mental disorder    Past Surgical History:  Past Surgical History:  Procedure Laterality Date  . BACK SURGERY     lumbar laminectomy  . CATARACT EXTRACTION W/PHACO  07/08/2012   Procedure: CATARACT EXTRACTION PHACO AND INTRAOCULAR LENS PLACEMENT (IOC);  Surgeon: Tonny Branch, MD;  Location: [facility];  Service: Ophthalmology;  Laterality: Left;  CDE 25.22  . CHOLECYSTECTOMY     MMH   HPI:  John Munoz is a 85 y.o.male from home with great neicewith medical history significant forCOPD, depression, BPH, recent right heel pressure ulcer with cellulitis and necrosis, recent UTI, brought to the ED with reports of altered mental status, lethargic of 2 days duration.  In ED, temp 91.1, tachypnea, CT chest> left > right lower lobe infiltrates. Started on  Vanc and Cefepime for HCAP and intubated in ED. COVID neg. MRSA screen +. Admitted for HCAP, acute diastolic CHF, acute encephalopathy. Made DNR the following day. Venous duplex 01/10/19- no DVT. Pt was vomiting tube feeds while intubated, he was successfully extubated 01/14/2019. Chest x-ray shows: Stable ventilation with continued dense left lower lobe collapse or consolidation and patchy right lung base opacity. Suspect pulmonary vascular congestion superimposed on chronic interstitial lung disease. Palliative team following. BSE requested.    Assessment / Plan / Recommendation Clinical Impression  Clinical swallow evaluation completed at bedside. Pt presents with generalized oral weakness, reduced vocal intensity, inability to generate a strong  volitional cough, and reduced trunk/head support when seen sitting up in chair. Pt able to follow simple directions and agreeable to po trials. Pt without overt signs of reduced airway protection during sequential straw sips of thin liquids, however does present with impaired mastication, bolus manipulation, and anterior posterior transit of solids resulting in pocketing. Recommend D1/puree and thin liquids via straw sips and po meds crushed as able in puree. SLP to follow. Pt will need 100% feeder assist. Above to RN.   SLP Visit Diagnosis: Dysphagia, unspecified (R13.10)    Aspiration Risk  Moderate aspiration risk    Diet Recommendation Dysphagia 1 (Puree);Thin liquid   Liquid Administration via: Straw;Cup Medication Administration: Crushed with puree Supervision: Staff to assist with self feeding;Full supervision/cueing for compensatory strategies Compensations: Minimize environmental distractions;Slow rate;Small sips/bites;Lingual sweep for clearance of pocketing Postural Changes: Seated upright at 90 degrees;Remain upright for at least 30 minutes after po intake    Other  Recommendations Oral Care Recommendations: Oral care BID;Staff/trained caregiver to provide oral care Other Recommendations: Clarify dietary restrictions   Follow up Recommendations Skilled Nursing facility      Frequency and Duration min 2x/week  1 week       Prognosis Prognosis for Safe Diet Advancement: [place] Date of Onset: 01/09/19 HPI: John Munoz is a 85 y.o.male from home with great neicewith medical history significant forCOPD, depression, BPH, recent right heel pressure ulcer with cellulitis and necrosis, recent UTI, brought to the ED with reports of altered mental status, lethargic of 2 days duration.  In ED, temp 91.1, tachypnea, CT chest>  left > right lower lobe infiltrates. Started on  Vanc and Cefepime for HCAP and intubated in ED. COVID neg. MRSA screen +.  Admitted for HCAP, acute diastolic CHF, acute encephalopathy. Made DNR the following day. Venous duplex 01/10/19- no DVT. Pt was vomiting tube feeds while intubated, he was successfully extubated 01/14/2019. Chest x-ray shows: Stable ventilation with continued dense left lower lobe collapse or consolidation and patchy right lung base opacity. Suspect pulmonary vascular congestion superimposed on chronic interstitial lung disease. Palliative team following. BSE requested.  Type of Study: Bedside Swallow Evaluation Previous Swallow Assessment: None Diet Prior to this Study: NPO Temperature Spikes Noted: No Respiratory Status: Nasal cannula History of Recent Intubation: Yes Length of Intubations (days): 5 days Date extubated: 01/14/19 Behavior/Cognition: Alert;Cooperative;Requires cueing Oral Cavity Assessment: Within Functional Limits Oral Care Completed by SLP: Yes Oral Cavity - Dentition: Edentulous Vision: Impaired for self-feeding Self-Feeding Abilities: Total assist Patient Positioning: Upright in chair Baseline Vocal Quality: Low vocal intensity Volitional Cough: Cognitively unable to elicit Volitional Swallow: Unable to elicit    Oral/Motor/Sensory Function Overall Oral Motor/Sensory Function: Generalized oral weakness   Ice Chips Ice chips: Within functional limits Presentation: Spoon   Thin Liquid Thin Liquid: Within functional limits Presentation: Straw(due to head positioning)    Nectar Thick Nectar Thick Liquid: Not tested   Honey Thick Honey Thick Liquid: Not tested   Puree Puree: Within functional limits Presentation: Spoon   Solid     Solid: Impaired Oral Phase Impairments: Reduced labial seal;Reduced lingual movement/coordination;Impaired mastication Oral Phase Functional Implications: Right lateral sulci pocketing;Left lateral sulci pocketing;Prolonged oral transit;Impaired mastication;Oral residue     Thank you,  Genene Churn,  [place] 01/15/2019,11:13 AM

## 2019-01-16 LAB — BASIC METABOLIC PANEL
Anion gap: 8 (ref 5–15)
BUN: 16 mg/dL (ref 8–23)
CO2: 26 mmol/L (ref 22–32)
Calcium: 8.1 mg/dL — ABNORMAL LOW (ref 8.9–10.3)
Chloride: 103 mmol/L (ref 98–111)
Creatinine, Ser: 0.42 mg/dL — ABNORMAL LOW (ref 0.61–1.24)
GFR calc Af Amer: >60 mL/min (ref >60)
GFR calc non Af Amer: >60 mL/min (ref >60)
Glucose, Bld: 77 mg/dL (ref 70–99)
Potassium: 3.2 mmol/L — ABNORMAL LOW (ref 3.5–5.1)
Sodium: 137 mmol/L (ref 135–145)

## 2019-01-16 LAB — MAGNESIUM: Magnesium: 1.9 mg/dL (ref 1.7–2.4)

## 2019-01-16 MED ORDER — POTASSIUM CHLORIDE CRYS ER 20 MEQ PO TBCR
40.0000 meq | EXTENDED_RELEASE_TABLET | ORAL | Status: AC
  Administered 2019-01-16 (×3): 40 meq via ORAL
  Filled 2019-01-16 (×3): qty 2

## 2019-01-16 NOTE — Progress Notes (Signed)
Pharmacy Antibiotic Note  John Munoz is a 85 y.o. male admitted on 01/09/2019 with infection- unknown source.  Pharmacy has been consulted for Vancomycin dosing.  Vancomycin trough level 9 on 5/13, goal 10-15, dose subsequently increased on 5/14 renal function has been stable since.   Plan: Vancomycin 1750 mg IV every 24 hours.  Goal trough 15-20 mcg/mL.  Cefepime 2000 mg IV every 8 hours. Monitor labs, c/s, and vanco levels as indicated.  Height: 5\' 8"  (172.7 cm) Weight: 164 lb 14.5 oz (74.8 kg) IBW/kg (Calculated) : 68.4  Temp (24hrs), Avg:98.3 F (36.8 C), Min:97 F (36.1 C), Max:99.6 F (37.6 C)  Recent Labs  Lab 01/09/19 1113 01/09/19 1303  01/11/19 0419 01/12/19 0525 01/12/19 1139  01/13/19 0458  01/13/19 1752 01/14/19 0424 01/14/19 0812 01/15/19 0448 01/16/19 0416  WBC 3.7*  --    < > 3.6* 10.1  --   --  7.1  --   --  5.2 5.0  --   --   CREATININE 0.54*  --    < > 0.72 0.59*  --    < >  --    < > 0.52* 0.49*  0.55* 0.51* 0.48* 0.42*  LATICACIDVEN 0.9 0.6  --   --   --   --   --   --   --   --   --   --   --   --   VANCOTROUGH  --   --   --   --   --  9*  --   --   --   --   --   --   --   --    < > = values in this interval not displayed.    Estimated Creatinine Clearance: 67.7 mL/min (A) (by C-G formula based on SCr of 0.42 mg/dL (L)).    No Known Allergies  Antimicrobials this admission: Vanco 5/10 >>  Cefepime 5/10 >> 5/12 Flagyl 5/10 >>5/11  Dose adjustments this admission: Vancomycin  Microbiology results: 5/10 BCx: NGTD 5/10 Covid 19: Negative 5/10 MRSA PCR: Negative 5/10 Resp Culture: Moderate MRSA S-vancomycin   Thank you for allowing pharmacy to be a part of this patient's care.  Donna Christen Gustavo Dispenza 01/16/2019 7:30 AM

## 2019-01-16 NOTE — Progress Notes (Signed)
Subjective: He remains off the ventilator.  He says he feels okay.  He is coughing but having trouble clearing his secretions.  Objective: Vital signs in last 24 hours: Temp:  [97 F (36.1 C)-99.6 F (37.6 C)] 98.8 F (37.1 C) (05/17 0500) Pulse Rate:  [77-94] 83 (05/17 0700) Resp:  [16-30] 25 (05/17 0700) BP: (118-153)/(58-83) 152/76 (05/17 0700) SpO2:  [90 %-100 %] 98 % (05/17 0700) Weight:  [74.8 kg] 74.8 kg (05/17 0450) Weight change: -0.7 kg Last BM Date: 01/10/19  Intake/Output from previous day: 05/16 0701 - 05/17 0700 In: 2111.5 [P.O.:170; RK/YH:0623.7; IV Piggyback:500] Out: 1525 [Urine:1525]  PHYSICAL EXAM General appearance: alert, cooperative and mild distress Resp: He has significant bilateral rhonchi left more than right Cardio: regular rate and rhythm, S1, S2 normal, no murmur, click, rub or gallop GI: soft, non-tender; bowel sounds normal; no masses,  no organomegaly Extremities: extremities normal, atraumatic, no cyanosis or edema  Lab Results:  Results for orders placed or performed during the hospital encounter of 01/09/19 (from the past 48 hour(s))  Glucose, capillary     Status: Abnormal   Collection Time: 01/14/19 11:07 AM  Result Value Ref Range   Glucose-Capillary 116 (H) 70 - 99 mg/dL  Magnesium     Status: None   Collection Time: 01/14/19 11:30 AM  Result Value Ref Range   Magnesium 1.9 1.7 - 2.4 mg/dL    Comment: Performed at [facility], [address], [place], [place] [address]  Blood gas, arterial     Status: Abnormal   Collection Time: 01/14/19  2:03 PM  Result Value Ref Range   FIO2 40.00    pH, Arterial 7.527 (H) 7.350 - 7.450   pCO2 arterial 38.8 32.0 - 48.0 mmHg   pO2, Arterial 81.4 (L) 83.0 - 108.0 mmHg   Bicarbonate 32.4 (H) 20.0 - 28.0 mmol/L   Acid-Base Excess 8.7 (H) 0.0 - 2.0 mmol/L   O2 Saturation 96.0 %   Patient temperature 37.8    Sample type LEFT RADIAL    Allens test (pass/fail) PASS PASS    Comment: Performed at  [facility], [address] [place], [place] [address]  Basic metabolic panel     Status: Abnormal   Collection Time: 01/15/19  4:48 AM  Result Value Ref Range   Sodium 139 135 - 145 mmol/L   Potassium 3.0 (L) 3.5 - 5.1 mmol/L   Chloride 103 98 - 111 mmol/L   CO2 28 22 - 32 mmol/L   Glucose, Bld 81 70 - 99 mg/dL   BUN 21 8 - 23 mg/dL   Creatinine, Ser 0.48 (L) 0.61 - 1.24 mg/dL   Calcium 8.2 (L) 8.9 - 10.3 mg/dL   GFR calc non Af Amer >60 >60 mL/min   GFR calc Af Amer >60 >60 mL/min   Anion gap 8 5 - 15    Comment: Performed at [facility], [address] [place], [place] [address]  Magnesium     Status: Abnormal   Collection Time: 01/15/19  4:48 AM  Result Value Ref Range   Magnesium 1.6 (L) 1.7 - 2.4 mg/dL    Comment: Performed at [facility] [address] [place], [place] [address]  Basic metabolic panel     Status: Abnormal   Collection Time: 01/16/19  4:16 AM  Result Value Ref Range   Sodium 137 135 - 145 mmol/L   Potassium 3.2 (L) 3.5 - 5.1 mmol/L   Chloride 103 98 - 111 mmol/L  CO2 26 22 - 32 mmol/L   Glucose, Bld 77 70 - 99 mg/dL   BUN 16 8 - 23 mg/dL   Creatinine, Ser 0.42 (L) 0.61 - 1.24 mg/dL   Calcium 8.1 (L) 8.9 - 10.3 mg/dL   GFR calc non Af Amer >60 >60 mL/min   GFR calc Af Amer >60 >60 mL/min   Anion gap 8 5 - 15    Comment: Performed at [facility], [address] [place], [place] [address]  Magnesium     Status: None   Collection Time: 01/16/19  4:16 AM  Result Value Ref Range   Magnesium 1.9 1.7 - 2.4 mg/dL    Comment: Performed at [facility], [address], [place], [place] [address]    ABGS Recent Labs    01/14/19 1403  PHART 7.527*  PO2ART 81.4*  HCO3 32.4*   CULTURES Recent Results (from the past 240 hour(s))  Blood Culture (routine x 2)     Status: None   Collection Time: 01/09/19 11:13 AM  Result Value Ref Range Status   Specimen Description BLOOD LEFT ARM BOTTLES DRAWN AEROBIC AND ANAEROBIC  Final   Special Requests Blood  Culture adequate volume  Final   Culture   Final    NO GROWTH 5 DAYS Performed at [facility], [address] [place], [place] [address]    Report Status 01/14/2019 FINAL  Final  Blood Culture (routine x 2)     Status: None   Collection Time: 01/09/19 11:35 AM  Result Value Ref Range Status   Specimen Description   Final    LEFT ANTECUBITAL BOTTLES DRAWN AEROBIC AND ANAEROBIC   Special Requests Blood Culture adequate volume  Final   Culture   Final    NO GROWTH 5 DAYS Performed at [facility], [address] [place],  [address]    Report Status 01/14/2019 FINAL  Final  SARS Coronavirus 2 (CEPHEID - Performed in [place] hospital lab), Hosp Order     Status: None   Collection Time: 01/09/19  2:58 PM  Result Value Ref Range Status   SARS Coronavirus 2 NEGATIVE NEGATIVE Final    Comment: (NOTE) If result is NEGATIVE SARS-CoV-2 target nucleic acids are NOT DETECTED. The SARS-CoV-2 RNA is generally detectable in upper and lower  respiratory specimens during the acute phase of infection. The lowest  concentration of SARS-CoV-2 viral copies this assay can detect is 250  copies / mL. A negative result does not preclude SARS-CoV-2 infection  and should not be used as the sole basis for treatment or other  patient management decisions.  A negative result may occur with  improper specimen collection / handling, submission of specimen other  than nasopharyngeal swab, presence of viral mutation(s) within the  areas targeted by this assay, and inadequate number of viral copies  (<250 copies / mL). A negative result must be combined with clinical  observations, patient history, and epidemiological information. If result is POSITIVE SARS-CoV-2 target nucleic acids are DETECTED. The SARS-CoV-2 RNA is generally detectable in upper and lower  respiratory specimens dur ing the acute phase of infection.  Positive  results are indicative of active infection with SARS-CoV-2.  Clinical   correlation with patient history and other diagnostic information is  necessary to determine patient infection status.  Positive results do  not rule out bacterial infection or co-infection with other viruses. If result is PRESUMPTIVE POSTIVE SARS-CoV-2 nucleic acids MAY BE PRESENT.   A presumptive positive result was  obtained on the submitted specimen  and confirmed on repeat testing.  While 2019 novel coronavirus  (SARS-CoV-2) nucleic acids may be present in the submitted sample  additional confirmatory testing may be necessary for epidemiological  and / or clinical management purposes  to differentiate between  SARS-CoV-2 and other Sarbecovirus currently known to infect humans.  If clinically indicated additional testing with an alternate test  methodology [phone]) is advised. The SARS-CoV-2 RNA is generally  detectable in upper and lower respiratory sp ecimens during the acute  phase of infection. The expected result is Negative. Fact Sheet for Patients:  StrictlyIdeas.no Fact Sheet for Healthcare Providers: BankingDealers.co.za This test is not yet approved or cleared by the [place] FDA and has been authorized for detection and/or diagnosis of SARS-CoV-2 by FDA under an Emergency Use Authorization (EUA).  This EUA will remain in effect (meaning this test can be used) for the duration of the COVID-19 declaration under Section 564(b)(1) of the Act, 21 U.S.C. section 360bbb-3(b)(1), unless the authorization is terminated or revoked sooner. Performed at [facility], [address] [place], [place] [address]   MRSA PCR Screening     Status: Abnormal   Collection Time: 01/09/19  8:47 PM  Result Value Ref Range Status   MRSA by PCR POSITIVE (A) NEGATIVE Final    Comment:        The GeneXpert MRSA Assay (FDA approved for NASAL specimens only), is one component of a comprehensive MRSA colonization surveillance program. It is  not intended to diagnose MRSA infection nor to guide or monitor treatment for MRSA infections. RESULT CALLED TO, READ BACK BY AND VERIFIED WITH: TETREAULT,H @ 0007 ON 01/10/19 BY JUW Performed at [facility], [address] [place], [place] [address]   Culture, respiratory     Status: None   Collection Time: 01/09/19 11:39 PM  Result Value Ref Range Status   Specimen Description   Final    TRACHEAL ASPIRATE Performed at [facility], [address] [place], [place] [address]    Special Requests   Final    NONE Performed at [facility], [address], [place], [place] [address]    Gram Stain   Final    MODERATE WBC PRESENT, PREDOMINANTLY PMN RARE GRAM POSITIVE COCCI RARE YEAST Performed at [facility], [place] [address], [place], [place] [address]    Culture   Final    MODERATE METHICILLIN RESISTANT STAPHYLOCOCCUS AUREUS   Report Status 01/12/2019 FINAL  Final   Organism ID, Bacteria METHICILLIN RESISTANT STAPHYLOCOCCUS AUREUS  Final      Susceptibility   Methicillin resistant staphylococcus aureus - MIC*    CIPROFLOXACIN >=8 RESISTANT Resistant     ERYTHROMYCIN >=8 RESISTANT Resistant     GENTAMICIN <=0.5 SENSITIVE Sensitive     OXACILLIN >=4 RESISTANT Resistant     TETRACYCLINE <=1 SENSITIVE Sensitive     VANCOMYCIN 1 SENSITIVE Sensitive     TRIMETH/SULFA <=10 SENSITIVE Sensitive     CLINDAMYCIN <=0.25 SENSITIVE Sensitive     RIFAMPIN <=0.5 SENSITIVE Sensitive     Inducible Clindamycin NEGATIVE Sensitive     * MODERATE METHICILLIN RESISTANT STAPHYLOCOCCUS AUREUS   Studies/Results: No results found.  Medications:  Prior to Admission:  Medications Prior to Admission  Medication Sig Dispense Refill Last Dose  . acetaminophen (TYLENOL) 500 MG tablet Take 1,000 mg by mouth every 6 (six) hours as needed. Pain   Past Week at Unknown  . allopurinol (ZYLOPRIM) 300 MG tablet Take 300 mg  by mouth daily.   01/08/2019 at Unknown time  . aspirin 81 MG chewable tablet Chew  81 mg by mouth daily.   01/08/2019 at 0930  . busPIRone (BUSPAR) 15 MG tablet Take 15 mg by mouth 2 (two) times daily.   01/08/2019 at Unknown time  . calcium gluconate 500 MG tablet Take 1 tablet by mouth daily.   01/08/2019 at Unknown time  . citalopram (CELEXA) 20 MG tablet Take 20 mg by mouth daily.   01/08/2019 at Unknown time  . folic acid (FOLVITE) 1 MG tablet Take 1 mg by mouth daily.   01/08/2019 at Unknown time  . gabapentin (NEURONTIN) 300 MG capsule Take 300 mg by mouth 3 (three) times daily.   01/08/2019 at Unknown time  . Melatonin 5 MG TABS Take 1 tablet by mouth at bedtime.   01/08/2019 at Unknown time  . midodrine (PROAMATINE) 2.5 MG tablet Take 2.5 mg by mouth 2 (two) times a day.   01/08/2019 at Unknown time  . Omega-3 Fatty Acids (FISH OIL) 1000 MG CAPS Take 1 capsule by mouth 2 (two) times a day.   01/08/2019 at Unknown time  . omeprazole (PRILOSEC) 20 MG capsule Take 20 mg by mouth daily.   01/08/2019 at Unknown time  . Tamsulosin HCl (FLOMAX) 0.4 MG CAPS Take 0.4 mg by mouth daily.    01/08/2019 at Unknown time  . traMADol (ULTRAM) 50 MG tablet Take 50 mg by mouth 3 (three) times daily.   01/08/2019 at Unknown time  . vitamin B-12 (CYANOCOBALAMIN) 1000 MCG tablet Take 1,000 mcg by mouth daily.   01/08/2019 at Unknown time  . ciprofloxacin (CIPRO) 500 MG tablet Take 500 mg by mouth 2 (two) times daily.   Completed Course at Unknown time   Scheduled: . acetylcysteine  3 mL Nebulization Q4H  . albuterol  2.5 mg Nebulization Q4H  . Chlorhexidine Gluconate Cloth  6 each Topical Q0600  . collagenase   Topical Daily  . potassium chloride  40 mEq Oral Q4H  . sodium chloride flush  10-40 mL Intracatheter Q12H   Continuous: . sodium chloride Stopped (01/12/19 1717)  . vancomycin 1,750 mg (01/15/19 1251)   YQI:HKVQQV chloride, acetaminophen, hydrALAZINE, ipratropium-albuterol, ondansetron (ZOFRAN) IV, sodium chloride flush  Assesment: He was admitted with sepsis and required pressor support briefly.   He has resolved sepsis pathophysiology  He has healthcare associated pneumonia in the left lower lobe and he is growing methicillin-resistant staph on sputum culture.  He had acute hypoxic and hypercapnic respiratory failure requiring intubation and mechanical ventilation he has been off the ventilator now for about 48 hours and has done pretty well  He had heart failure which seems to have resolved and he is mostly euvolemic now  He has had hypokalemia and is still somewhat hypokalemic and that is being replaced  He has thrombocytopenia presumably secondary to sepsis and that was still present 2 days ago  He had a pressure injury to his heel and had cellulitis prior to this admission but the heel looks okay  He had significant metabolic encephalopathy but is responsive and able to talk to me now Principal Problem:   HCAP (healthcare-associated pneumonia) Active Problems:   Sepsis with acute hypoxic respiratory failure ([place])   Hypotension   Abnormal liver function   Pressure injury of skin   Sepsis due to undetermined organism ([place])   Acute respiratory failure with hypoxia ([facility])   Thrombocytopenia ([place])   Acute CHF (congestive heart failure) ([place])  Altered mental status   Acute metabolic encephalopathy   Goals of care, counseling/discussion   Palliative care encounter   Pancytopenia ([place])   Pneumonia of left lower lobe due to infectious organism [facility])    Plan: Continue current treatments.    LOS: 7 days   Alonza Bogus 01/16/2019, 8:15 AM

## 2019-01-16 NOTE — Progress Notes (Signed)
PROGRESS NOTE    John Munoz   WLS:[id number]  DOB: 1933-01-09  DOA: 01/09/2019 PCP: Neale Burly, MD   Brief Narrative:  John Munoz is a 85 y.o. male from home with great neice with medical history significant for COPD, depression, BPH, recent right heel pressure ulcer with cellulitis and necrosis, recent UTI, brought to the ED with reports of altered mental status, lethargic of 2 days duration.  In ED, temp 91.1, tachypnea, CT chest> left > right lower lobe infiltrates. Started on  Vanc and Cefepime for HCAP and intubated in ED. COVID neg. MRSA screen +.  Admitted for HCAP, acute diastolic CHF, acute encephalopathy. Made DNR the following day. Venous duplex 01/10/19- no DVT   Subjective: He has a cough. No other complaints.   Assessment & Plan:   Principal Problem:     Sepsis- hypothermia, hypotension   Acute respiratory failure with hypoxia due to HCAP and acute diastolic CHF - remains intubated and on Vanc, Flagyl and Cefepime- respiratory culture reveals moderate MRSA - would continue all three antibiotics for now - he has been weaned off of Levophed - d/c stress dose steroids - 2 D ECHO reveals normal EF- ? If he has diastolic dysfunction- I am trying to keep him on the dry side - he was successfully extubated  - today is day 7 of antibiotics- he still has quite a bit of sputum therefore, I will continue Vancomycin   Active Problems:  FEN - has passed swallow eval and has been started on a diet  Anemia - Hb 7.2 on 5/1 for which he was transfused 2 U PRBC- anemia panel was consistent with AOCD and no GI bleed was noted  Electrolyte abnormalities - low K, Mag and phos- will continue to replace and follow     Abnormal liver function/ thrombocytopenia - suspected to be due to sepsis- Hepatitis panel negative - noted to be improving    Pressure injury of skin- stage 3 left heel - appreciate wound care eval - recommendations> Enzymatic debridement and  saline moist gauze. Cover with dry dressing. Change daily.    Time spent in minutes: 35 min DVT prophylaxis: SCDs Code Status: DNR-  Family Communication:  Disposition Plan: - start search for SNF Consultants:   pulmonary Procedures:  2 D ECHO 1. The left ventricle has normal systolic function, with an ejection fraction of 55-60%. The cavity size was normal. There is mild concentric left ventricular hypertrophy. Left ventricular diastolic Doppler parameters are indeterminate. No evidence of  left ventricular regional wall motion abnormalities.  2. The right ventricle has normal systolic function. The cavity was normal. There is no increase in right ventricular wall thickness.  3. Left atrial size was mildly dilated.  4. The mitral valve is grossly normal. Mild thickening of the mitral valve leaflet. There is mild to moderate mitral annular calcification present.  5. The tricuspid valve is grossly normal.  6. The aortic valve is tricuspid. Mild thickening of the aortic valve. Mild calcification of the aortic valve. Aortic valve regurgitation is trivial by color flow Doppler.  7. Moderate aortic annular calcification.  8. The aortic root is normal in size and structure.  9. The inferior vena cava was dilated in size with <50% respiratory variability.  Antimicrobials:  Anti-infectives (From admission, onward)   Start     Dose/Rate Route Frequency Ordered Stop   01/13/19 1300  vancomycin (VANCOCIN) 1,750 mg in sodium chloride 0.9 % 500 mL IVPB  1,750 mg 250 mL/hr over 120 Minutes Intravenous Every 24 hours 01/12/19 1306     01/10/19 1230  vancomycin (VANCOCIN) 1,500 mg in sodium chloride 0.9 % 500 mL IVPB  Status:  Discontinued     1,500 mg 250 mL/hr over 120 Minutes Intravenous Every 24 hours 01/09/19 1230 01/10/19 0759   01/10/19 1230  vancomycin (VANCOCIN) 1,500 mg in sodium chloride 0.9 % 500 mL IVPB     1,500 mg 250 mL/hr over 120 Minutes Intravenous Every 24 hours 01/10/19  0800 01/12/19 1622   01/09/19 2000  ceFEPIme (MAXIPIME) 2 g in sodium chloride 0.9 % 100 mL IVPB     2 g 200 mL/hr over 30 Minutes Intravenous Every 8 hours 01/09/19 1219 01/11/19 2211   01/09/19 1330  vancomycin (VANCOCIN) 500 mg in sodium chloride 0.9 % 100 mL IVPB     500 mg 100 mL/hr over 60 Minutes Intravenous  Once 01/09/19 1221 01/09/19 1502   01/09/19 1115  ceFEPIme (MAXIPIME) 2 g in sodium chloride 0.9 % 100 mL IVPB     2 g 200 mL/hr over 30 Minutes Intravenous  Once 01/09/19 1112 01/09/19 1214   01/09/19 1115  metroNIDAZOLE (FLAGYL) IVPB 500 mg     500 mg 100 mL/hr over 60 Minutes Intravenous  Once 01/09/19 1112 01/09/19 1239   01/09/19 1115  vancomycin (VANCOCIN) IVPB 1000 mg/200 mL premix     1,000 mg 200 mL/hr over 60 Minutes Intravenous  Once 01/09/19 1112 01/09/19 1336       Objective: Vitals:   01/16/19 0800 01/16/19 0825 01/16/19 0900 01/16/19 1255  BP: (!) 141/79  (!) 150/81   Pulse: 84  85   Resp: (!) 27  (!) 25   Temp:      TempSrc:      SpO2: 98% 99% 98% 96%  Weight:      Height:        Intake/Output Summary (Last 24 hours) at 01/16/2019 1335 Last data filed at 01/16/2019 8891 Gross per 24 hour  Intake 2071.5 ml  Output 1525 ml  Net 546.5 ml   Filed Weights   01/14/19 0500 01/15/19 0500 01/16/19 0450  Weight: 75.2 kg 75.5 kg 74.8 kg    Examination: General exam: Appears comfortable  HEENT: PERRLA, oral mucosa moist, no sclera icterus or thrush Respiratory system: Clear to auscultation. Respiratory effort normal. Cardiovascular system: S1 & S2 heard,  No murmurs  Gastrointestinal system: Abdomen soft, non-tender, nondistended. Normal bowel sounds   Central nervous system: Alert and oriented. No focal neurological deficits. Extremities: No cyanosis, clubbing or edema Skin: No rashes or ulcers Psychiatry:  Mood & affect appropriate.       Data Reviewed: I have personally reviewed following labs and imaging studies  CBC: Recent Labs  Lab  01/11/19 0419 01/12/19 0525 01/13/19 0458 01/14/19 0424 01/14/19 0812  WBC 3.6* 10.1 7.1 5.2 5.0  HGB 9.9* 11.0* 10.3* 9.6* 9.1*  HCT 29.9* 32.7* 30.2* 28.6* 28.0*  MCV 94.9 94.2 93.2 95.3 96.9  PLT 65* 63* 66* 71* 72*   Basic Metabolic Panel: Recent Labs  Lab 01/12/19 0525  01/13/19 0458 01/13/19 0859 01/13/19 1752 01/14/19 0424 01/14/19 0812 01/14/19 1130 01/15/19 0448 01/16/19 0416  NA 140   < >  --  138 134* 137 138  --  139 137  K 3.0*   < >  --  2.7* 3.3* 3.2* 3.4*  --  3.0* 3.2*  CL 105   < >  --  97*  97* 99 100  --  103 103  CO2 25   < >  --  29 29 30 30   --  28 26  GLUCOSE 186*   < >  --  120* 148* 125* 132*  --  81 77  BUN 22   < >  --  17 19 23 23   --  21 16  CREATININE 0.59*   < >  --  0.60* 0.52* 0.49*  0.55* 0.51*  --  0.48* 0.42*  CALCIUM 9.0   < >  --  8.5* 8.0* 8.3* 8.3*  --  8.2* 8.1*  MG 1.6*  --  1.6* 1.6*  --   --   --  1.9 1.6* 1.9  PHOS 1.7*  --  3.2 3.4  --   --   --   --   --   --    < > = values in this interval not displayed.   GFR: Estimated Creatinine Clearance: 67.7 mL/min (A) (by C-G formula based on SCr of 0.42 mg/dL (L)). Liver Function Tests: Recent Labs  Lab 01/10/19 0413 01/11/19 0419 01/12/19 0525 01/12/19 1708  AST 72* 87* 56* 44*  ALT 47* 53* 49* 44  ALKPHOS 95 99 92 90  BILITOT 0.5 1.0 0.7 1.0  PROT 5.4* 5.9* 5.9* 5.7*  ALBUMIN 2.7* 2.9* 2.7* 2.6*   No results for input(s): LIPASE, AMYLASE in the last 168 hours. No results for input(s): AMMONIA in the last 168 hours. Coagulation Profile: Recent Labs  Lab 01/10/19 0935  INR 1.1   Cardiac Enzymes: Recent Labs  Lab 01/09/19 1516 01/10/19 0413 01/10/19 0546 01/10/19 0935 01/10/19 1516 01/10/19 2003  CKTOTAL 551*  --   --   --   --   --   TROPONINI <0.03 0.46* 0.54* 0.78* 0.89* 0.93*   BNP (last 3 results) No results for input(s): PROBNP in the last 8760 hours. HbA1C: No results for input(s): HGBA1C in the last 72 hours. CBG: Recent Labs  Lab 01/13/19  2110 01/13/19 2352 01/14/19 0431 01/14/19 0744 01/14/19 1107  GLUCAP 132* 129* 132* 132* 116*   Lipid Profile: No results for input(s): CHOL, HDL, LDLCALC, TRIG, CHOLHDL, LDLDIRECT in the last 72 hours. Thyroid Function Tests: No results for input(s): TSH, T4TOTAL, FREET4, T3FREE, THYROIDAB in the last 72 hours. Anemia Panel: No results for input(s): VITAMINB12, FOLATE, FERRITIN, TIBC, IRON, RETICCTPCT in the last 72 hours. Urine analysis:    Component Value Date/Time   COLORURINE YELLOW 01/09/2019 1114   APPEARANCEUR CLEAR 01/09/2019 1114   LABSPEC 1.017 01/09/2019 1114   PHURINE 5.0 01/09/2019 1114   GLUCOSEU NEGATIVE 01/09/2019 1114   [place] 01/09/2019 1114   [place] 01/09/2019 [place] 01/09/2019 [place] 01/09/2019 1114   NITRITE NEGATIVE 01/09/2019 1114   LEUKOCYTESUR TRACE (A) 01/09/2019 1114   Sepsis Labs: @LABRCNTIP (procalcitonin:4,lacticidven:4) ) Recent Results (from the past 240 hour(s))  Blood Culture (routine x 2)     Status: None   Collection Time: 01/09/19 11:13 AM  Result Value Ref Range Status   Specimen Description BLOOD LEFT ARM BOTTLES DRAWN AEROBIC AND ANAEROBIC  Final   Special Requests Blood Culture adequate volume  Final   Culture   Final    NO GROWTH 5 DAYS Performed at [facility], [address], [place], [place] [address]    Report Status 01/14/2019 FINAL  Final  Blood Culture (routine x 2)     Status: None   Collection  Time: 01/09/19 11:35 AM  Result Value Ref Range Status   Specimen Description   Final    LEFT ANTECUBITAL BOTTLES DRAWN AEROBIC AND ANAEROBIC   Special Requests Blood Culture adequate volume  Final   Culture   Final    NO GROWTH 5 DAYS Performed at [facility], [place], [address], [place], [place] [address]    Report Status 01/14/2019 FINAL  Final  SARS Coronavirus 2 (CEPHEID - Performed in [place] hospital lab), Hosp Order     Status: None   Collection Time:  01/09/19  2:58 PM  Result Value Ref Range Status   SARS Coronavirus 2 NEGATIVE NEGATIVE Final    Comment: (NOTE) If result is NEGATIVE SARS-CoV-2 target nucleic acids are NOT DETECTED. The SARS-CoV-2 RNA is generally detectable in upper and lower  respiratory specimens during the acute phase of infection. The lowest  concentration of SARS-CoV-2 viral copies this assay can detect is 250  copies / mL. A negative result does not preclude SARS-CoV-2 infection  and should not be used as the sole basis for treatment or other  patient management decisions.  A negative result may occur with  improper specimen collection / handling, submission of specimen other  than nasopharyngeal swab, presence of viral mutation(s) within the  areas targeted by this assay, and inadequate number of viral copies  (<250 copies / mL). A negative result must be combined with clinical  observations, patient history, and epidemiological information. If result is POSITIVE SARS-CoV-2 target nucleic acids are DETECTED. The SARS-CoV-2 RNA is generally detectable in upper and lower  respiratory specimens dur ing the acute phase of infection.  Positive  results are indicative of active infection with SARS-CoV-2.  Clinical  correlation with patient history and other diagnostic information is  necessary to determine patient infection status.  Positive results do  not rule out bacterial infection or co-infection with other viruses. If result is PRESUMPTIVE POSTIVE SARS-CoV-2 nucleic acids MAY BE PRESENT.   A presumptive positive result was obtained on the submitted specimen  and confirmed on repeat testing.  While 2019 novel coronavirus  (SARS-CoV-2) nucleic acids may be present in the submitted sample  additional confirmatory testing may be necessary for epidemiological  and / or clinical management purposes  to differentiate between  SARS-CoV-2 and other Sarbecovirus currently known to infect humans.  If clinically  indicated additional testing with an alternate test  methodology [phone]) is advised. The SARS-CoV-2 RNA is generally  detectable in upper and lower respiratory sp ecimens during the acute  phase of infection. The expected result is Negative. Fact Sheet for Patients:  StrictlyIdeas.no Fact Sheet for Healthcare Providers: BankingDealers.co.za This test is not yet approved or cleared by the [place] FDA and has been authorized for detection and/or diagnosis of SARS-CoV-2 by FDA under an Emergency Use Authorization (EUA).  This EUA will remain in effect (meaning this test can be used) for the duration of the COVID-19 declaration under Section 564(b)(1) of the Act, 21 U.S.C. section 360bbb-3(b)(1), unless the authorization is terminated or revoked sooner. Performed at [facility], [address] [place], [place] [address]   MRSA PCR Screening     Status: Abnormal   Collection Time: 01/09/19  8:47 PM  Result Value Ref Range Status   MRSA by PCR POSITIVE (A) NEGATIVE Final    Comment:        The GeneXpert MRSA Assay (FDA approved for NASAL specimens only), is one component of a comprehensive MRSA colonization surveillance program.  It is not intended to diagnose MRSA infection nor to guide or monitor treatment for MRSA infections. RESULT CALLED TO, READ BACK BY AND VERIFIED WITH: TETREAULT,H @ 0007 ON 01/10/19 BY JUW Performed at [facility], [address] [place], [place] [address]   Culture, respiratory     Status: None   Collection Time: 01/09/19 11:39 PM  Result Value Ref Range Status   Specimen Description   Final    TRACHEAL ASPIRATE Performed at [facility], [address], [place], [place] [address]    Special Requests   Final    NONE Performed at [facility], [address] [place], [place] [address]    Gram Stain   Final    MODERATE WBC PRESENT, PREDOMINANTLY PMN RARE GRAM POSITIVE COCCI RARE YEAST Performed  at [facility], [place] [address] [place], [place] [address]    Culture   Final    MODERATE METHICILLIN RESISTANT STAPHYLOCOCCUS AUREUS   Report Status 01/12/2019 FINAL  Final   Organism ID, Bacteria METHICILLIN RESISTANT STAPHYLOCOCCUS AUREUS  Final      Susceptibility   Methicillin resistant staphylococcus aureus - MIC*    CIPROFLOXACIN >=8 RESISTANT Resistant     ERYTHROMYCIN >=8 RESISTANT Resistant     GENTAMICIN <=0.5 SENSITIVE Sensitive     OXACILLIN >=4 RESISTANT Resistant     TETRACYCLINE <=1 SENSITIVE Sensitive     VANCOMYCIN 1 SENSITIVE Sensitive     TRIMETH/SULFA <=10 SENSITIVE Sensitive     CLINDAMYCIN <=0.25 SENSITIVE Sensitive     RIFAMPIN <=0.5 SENSITIVE Sensitive     Inducible Clindamycin NEGATIVE Sensitive     * MODERATE METHICILLIN RESISTANT STAPHYLOCOCCUS AUREUS         Radiology Studies: No results found.    Scheduled Meds: . acetylcysteine  3 mL Nebulization Q4H  . albuterol  2.5 mg Nebulization Q4H  . Chlorhexidine Gluconate Cloth  6 each Topical Q0600  . collagenase   Topical Daily  . potassium chloride  40 mEq Oral Q4H  . sodium chloride flush  10-40 mL Intracatheter Q12H   Continuous Infusions: . sodium chloride Stopped (01/12/19 1717)  . vancomycin 1,750 mg (01/15/19 1251)     LOS: 7 days      Debbe Odea, MD [facility] Pager: [email or web address] Password [email or web address] 01/16/2019, 1:35 PM

## 2019-01-16 NOTE — Progress Notes (Signed)
Foley catheter pulled. Condom catheter placed. PT tolerated procedure well. Continue to monitor.

## 2019-01-17 DIAGNOSIS — Z7189 Other specified counseling: Secondary | ICD-10-CM

## 2019-01-17 DIAGNOSIS — Z515 Encounter for palliative care: Secondary | ICD-10-CM

## 2019-01-17 LAB — BASIC METABOLIC PANEL
Anion gap: 9 (ref 5–15)
BUN: 14 mg/dL (ref 8–23)
CO2: 27 mmol/L (ref 22–32)
Calcium: 8.4 mg/dL — ABNORMAL LOW (ref 8.9–10.3)
Chloride: 101 mmol/L (ref 98–111)
Creatinine, Ser: 0.43 mg/dL — ABNORMAL LOW (ref 0.61–1.24)
GFR calc Af Amer: >60 mL/min (ref >60)
GFR calc non Af Amer: >60 mL/min (ref >60)
Glucose, Bld: 99 mg/dL (ref 70–99)
Potassium: 3.5 mmol/L (ref 3.5–5.1)
Sodium: 137 mmol/L (ref 135–145)

## 2019-01-17 MED ORDER — TRAMADOL HCL 50 MG PO TABS
50.0000 mg | ORAL_TABLET | Freq: Once | ORAL | Status: AC
  Administered 2019-01-17: 50 mg via ORAL
  Filled 2019-01-17: qty 1

## 2019-01-17 MED ORDER — SULFAMETHOXAZOLE-TRIMETHOPRIM 400-80 MG PO TABS: 1.0000 | ORAL_TABLET | Freq: Two times a day (BID) | ORAL | 0 refills | Status: AC

## 2019-01-17 MED ORDER — SULFAMETHOXAZOLE-TRIMETHOPRIM 800-160 MG PO TABS
1.0000 | ORAL_TABLET | Freq: Two times a day (BID) | ORAL | Status: AC
  Administered 2019-01-17 – 2019-01-19 (×6): 1 via ORAL
  Filled 2019-01-17 (×6): qty 1

## 2019-01-17 NOTE — Progress Notes (Signed)
  Speech Language Pathology Treatment: Dysphagia  Patient Details Name: John Munoz MRN: [id number] DOB: 1933/03/10 Today's Date: 01/17/2019 Time: 7116-5790 SLP Time Calculation (min) (ACUTE ONLY): 21 min  Assessment / Plan / Recommendation Clinical Impression  Pt seen in room for ongoing diagnostic dysphagia intervention following clinical swallow evaluation completed on Saturday. Pt with poor trunk support and leans left in his recliner. He also continues to have difficulty supporting the weight of his head, however with tactile and verbal cues he can elevate briefly. Pt able to generate a cough with mod/max cues from SLP, which he was unable to do on Saturday. CNA already fed Pt lunch meal of puree and thin (~65% consumed), but he was agreeable for mech soft trials with SLP. Pt with prolonged oral transit and mild residuals post swallow (left) and consumed straw sips of tea and coffee without overt signs/symptoms of reduced airway protection. Pt is at risk for aspiration given reduced respiratory support, poor trunk and head support, and current debilitation. Recommend continuing D1/puree and thin liquids with 100% feeder assist. SLP will follow.   HPI HPI: John Munoz is a 85 y.o.male from home with great neicewith medical history significant forCOPD, depression, BPH, recent right heel pressure ulcer with cellulitis and necrosis, recent UTI, brought to the ED with reports of altered mental status, lethargic of 2 days duration.  In ED, temp 91.1, tachypnea, CT chest> left > right lower lobe infiltrates. Started on  Vanc and Cefepime for HCAP and intubated in ED. COVID neg. MRSA screen +. Admitted for HCAP, acute diastolic CHF, acute encephalopathy. Made DNR the following day. Venous duplex 01/10/19- no DVT. Pt was vomiting tube feeds while intubated, he was successfully extubated 01/14/2019. Chest x-ray shows: Stable ventilation with continued dense left lower lobe collapse or consolidation and  patchy right lung base opacity. Suspect pulmonary vascular congestion superimposed on chronic interstitial lung disease. Palliative team following. BSE requested.       SLP Plan  Continue with current plan of care       Recommendations  Diet recommendations: Dysphagia 1 (puree);Thin liquid Liquids provided via: Straw Medication Administration: Crushed with puree Supervision: Full supervision/cueing for compensatory strategies;Staff to assist with self feeding Compensations: Minimize environmental distractions;Slow rate;Small sips/bites;Lingual sweep for clearance of pocketing Postural Changes and/or Swallow Maneuvers: Seated upright 90 degrees;Upright 30-60 min after meal                Oral Care Recommendations: Oral care BID;Staff/trained caregiver to provide oral care Follow up Recommendations: Skilled Nursing facility SLP Visit Diagnosis: Dysphagia, unspecified (R13.10) Plan: Continue with current plan of care       Thank you,  Genene Churn, [place]                 [place] 01/17/2019, 12:42 PM

## 2019-01-17 NOTE — Plan of Care (Signed)
  Problem: Acute Rehab OT Goals (only OT should resolve) Goal: Pt. Will Perform Eating Flowsheets (Taken 01/17/2019 0927) Pt Will Perform Eating: with max assist; sitting; with adaptive utensils Goal: Pt. Will Perform Grooming Flowsheets (Taken 01/17/2019 0927) Pt Will Perform Grooming: with max assist; sitting Goal: Pt. Will Perform Upper Body Bathing Flowsheets (Taken 01/17/2019 0927) Pt Will Perform Upper Body Bathing: with max assist; sitting Goal: Pt. Will Perform Upper Body Dressing Flowsheets (Taken 01/17/2019 0927) Pt Will Perform Upper Body Dressing: with max assist; sitting Goal: Pt. Will Transfer To Toilet Flowsheets (Taken 01/17/2019 [phone]) Pt Will Transfer to Toilet: with mod assist; stand pivot transfer; bedside commode Goal: Pt. Will Perform Toileting-Clothing Manipulation Flowsheets (Taken 01/17/2019 0927) Pt Will Perform Toileting - Clothing Manipulation and hygiene: with max assist; sitting/lateral leans Goal: OT Additional ADL Goal #1 Flowsheets (Taken 01/17/2019 0927) Additional ADL Goal #1: patient will increase activity tolerance by completing a self care task seated at EOB for 8 minutes with supervision for balance.

## 2019-01-17 NOTE — Progress Notes (Signed)
Palliative: John Munoz is sitting up in the [place] chair in his room.  He will somewhat make but not keep eye contact.  He appears acutely/chronically ill and very frail.  He is able to make his basic needs known.  It seems that he has difficulty holding his head upright.  He is able to take several sips of coffee without overt signs and symptoms of aspiration.  There is no family at bedside at this time due to visitor restrictions.  John Munoz tells me that he would like to call his uncle who lives near him.    Call to [facility] at [phone].  John Munoz tells me that John Munoz lives in her home. She has had to work with him from "WC to walker", and he is motivated at times to do PT, but not always.  She is his CNA through [facility].    We talk about the treatment plan in detail.  We talk about his improved respiratory status and his R heel wound. She shares that they have had wound care come in the past. [facility] already in place. John Munoz has her mother to help with decision making.   Assessment: No acute distress, appears chronically ill and frail, no respiratory distress, multiple areas of dry rough skin some bruising.  Goals/Plan: John Munoz would clearly benefit from some time in short-term rehab, but placement can sometimes worsen outcomes in the elderly with memory loss.  He lives with his great niece who tells me that she is experienced in caring for John Munoz.  She plans to come to the hospital between 2 and 2:30 to assess her ability to care for him at home with home health services ([facility] already in place).  72 minutes  John Axe, NP  Palliative Medicine Team  Team Phone # [phone]  Greater than 50% of this time was spent counseling and coordinating care related to the above assessment and plan.

## 2019-01-17 NOTE — Progress Notes (Signed)
Physical Therapy Treatment Patient Details Name: John Munoz MRN: [id number] DOB: September 07, 1932 Today's Date: 01/17/2019    History of Present Illness He was admitted with acute metabolic encephalopathy.  This appears to be related to healthcare associated pneumonia with sepsis.  He briefly required pressor support.  He also had acute heart failure at that time.  He was discovered to have left lower lobe pneumonia.  He had acute hypoxic and hypercapnic respiratory failure requiring intubation and mechanical ventilation.  He was able to be extubated approximately 72 hours ago.    PT Comments    Patient presents more alert and easier to understand when talking, demonstrates slightly increased strength when attempting to sit up at bedside and mostly limited due to BUE weakness/stiffness.  Patient had difficulty holding onto RW and unable to stand due to feet sliding forward and BLE weakness.  Patient required partial stand pivot to transfer to chair and left with mechanical lift harness in place - nursing staff notified.  Patient will benefit from continued physical therapy in hospital and recommended venue below to increase strength, balance, endurance for safe ADLs and gait.   Follow Up Recommendations  SNF     Equipment Recommendations  None recommended by PT    Recommendations for Other Services       Precautions / Restrictions Precautions Precautions: Fall Restrictions Weight Bearing Restrictions: No    Mobility  Bed Mobility Overal bed mobility: Needs Assistance Bed Mobility: Supine to Sit     Supine to sit: Max assist     General bed mobility comments: slow labored movement with difficulty using BUE due to stiffness  Transfers Overall transfer level: Needs assistance Equipment used: 1 person hand held assist Transfers: Stand Pivot Transfers;Sit to/from Stand Sit to Stand: Max assist;Total assist Stand pivot transfers: Max assist;Total assist       General transfer  comment: patient unable to standing using RW due to weakness, required partial stand pivot to transfer to chair  Ambulation/Gait                 Stairs             Wheelchair Mobility    Modified Rankin (Stroke Patients Only)       Balance Overall balance assessment: Needs assistance Sitting-balance support: Feet supported;Bilateral upper extremity supported Sitting balance-Leahy Scale: Poor Sitting balance - Comments: frequent falling backwards, able maintain sitting balance for up to 10-20 seconds when holding knees using BUE Postural control: Posterior lean;Left lateral lean   Standing balance-Leahy Scale: Poor Standing balance comment: hand held assist                            Cognition Arousal/Alertness: Awake/alert Behavior During Therapy: WFL for tasks assessed/performed Overall Cognitive Status: Within Functional Limits for tasks assessed                                 General Comments: Patient more alert easier to understand      Exercises General Exercises - Lower Extremity Ankle Circles/Pumps: Seated;AAROM;Both;10 reps;Strengthening Long Arc Quad: Seated;AAROM;Strengthening;Both;10 reps Hip Flexion/Marching: Seated;Strengthening;AAROM;Both;10 reps    General Comments        Pertinent Vitals/Pain Pain Assessment: 0-10 Pain Score: 8  Pain Location: low back and right hip Pain Descriptors / Indicators: Aching;Discomfort Pain Intervention(s): Limited activity within patient's tolerance;Monitored during session    Home Living  Prior Function            PT Goals (current goals can now be found in the care plan section) Acute Rehab PT Goals Patient Stated Goal: return home after rehab PT Goal Formulation: With patient Time For Goal Achievement: 01/29/19 Potential to Achieve Goals: Good Progress towards PT goals: Progressing toward goals    Frequency    Min 3X/week       PT Plan Current plan remains appropriate    Co-evaluation PT/OT/SLP Co-Evaluation/Treatment: Yes Reason for Co-Treatment: Complexity of the patient's impairments (multi-system involvement) PT goals addressed during session: Mobility/safety with mobility;Proper use of DME;Strengthening/ROM;Balance        AM-PAC PT "6 Clicks" Mobility   Outcome Measure  Help needed turning from your back to your side while in a flat bed without using bedrails?: A Lot Help needed moving from lying on your back to sitting on the side of a flat bed without using bedrails?: A Lot Help needed moving to and from a bed to a chair (including a wheelchair)?: A Lot Help needed standing up from a chair using your arms (e.g., wheelchair or bedside chair)?: A Lot Help needed to walk in hospital room?: Total Help needed climbing 3-5 steps with a railing? : Total 6 Click Score: 10    End of Session   Activity Tolerance: Patient tolerated treatment well;Patient limited by fatigue Patient left: in chair;with call bell/phone within reach Nurse Communication: Mobility status PT Visit Diagnosis: Unsteadiness on feet (R26.81);Other abnormalities of gait and mobility (R26.89);Muscle weakness (generalized) (M62.81)     Time: 4825-0037 PT Time Calculation (min) (ACUTE ONLY): 41 min  Charges:  $Therapeutic Exercise: 8-22 mins $Therapeutic Activity: 8-22 mins                     1:31 PM, 01/17/19 John Munoz, MPT Physical Therapist with [facility] [phone] office [phone] mobile phone

## 2019-01-17 NOTE — Progress Notes (Signed)
Patient's family visited patient to assess her ability to care for him at home. She decided that it would be best for the patient to go to SNF at discharge. She requests that patient go to [facility] or [facility]. CSW and MD made aware.  Celestia Khat, RN

## 2019-01-17 NOTE — Progress Notes (Signed)
Subjective: He says he feels better.  No new complaints.  He is still coughing up sputum.  Objective: Vital signs in last 24 hours: Temp:  [97.5 F (36.4 C)-98 F (36.7 C)] 97.5 F (36.4 C) (05/18 0720) Pulse Rate:  [66-90] 68 (05/18 0720) Resp:  [13-27] 16 (05/18 0720) BP: (118-158)/(57-81) 126/63 (05/18 0600) SpO2:  [90 %-100 %] 100 % (05/18 0720) Weight:  [74.9 kg] 74.9 kg (05/18 0500) Weight change: 0.1 kg Last BM Date: 01/15/01  Intake/Output from previous day: 05/17 0701 - 05/18 0700 In: 1140 [P.O.:1130; I.V.:10] Out: 500 [Urine:500]  PHYSICAL EXAM General appearance: cooperative and no distress Resp: rhonchi bilaterally Cardio: regular rate and rhythm, S1, S2 normal, no murmur, click, rub or gallop GI: soft, non-tender; bowel sounds normal; no masses,  no organomegaly Extremities: extremities normal, atraumatic, no cyanosis or edema  Lab Results:  Results for orders placed or performed during the hospital encounter of 01/09/19 (from the past 48 hour(s))  Basic metabolic panel     Status: Abnormal   Collection Time: 01/16/19  4:16 AM  Result Value Ref Range   Sodium 137 135 - 145 mmol/L   Potassium 3.2 (L) 3.5 - 5.1 mmol/L   Chloride 103 98 - 111 mmol/L   CO2 26 22 - 32 mmol/L   Glucose, Bld 77 70 - 99 mg/dL   BUN 16 8 - 23 mg/dL   Creatinine, Ser 0.42 (L) 0.61 - 1.24 mg/dL   Calcium 8.1 (L) 8.9 - 10.3 mg/dL   GFR calc non Af Amer >60 >60 mL/min   GFR calc Af Amer >60 >60 mL/min   Anion gap 8 5 - 15    Comment: Performed at [facility], [address] [place], [place] [address]  Magnesium     Status: None   Collection Time: 01/16/19  4:16 AM  Result Value Ref Range   Magnesium 1.9 1.7 - 2.4 mg/dL    Comment: Performed at [facility], [address], [place], [place] [address]  Basic metabolic panel     Status: Abnormal   Collection Time: 01/17/19  3:54 AM  Result Value Ref Range   Sodium 137 135 - 145 mmol/L   Potassium 3.5 3.5 - 5.1 mmol/L   Chloride  101 98 - 111 mmol/L   CO2 27 22 - 32 mmol/L   Glucose, Bld 99 70 - 99 mg/dL   BUN 14 8 - 23 mg/dL   Creatinine, Ser 0.43 (L) 0.61 - 1.24 mg/dL   Calcium 8.4 (L) 8.9 - 10.3 mg/dL   GFR calc non Af Amer >60 >60 mL/min   GFR calc Af Amer >60 >60 mL/min   Anion gap 9 5 - 15    Comment: Performed at [facility], [address], [place], [place] [address]    ABGS Recent Labs    01/14/19 1403  PHART 7.527*  PO2ART 81.4*  HCO3 32.4*   CULTURES Recent Results (from the past 240 hour(s))  Blood Culture (routine x 2)     Status: None   Collection Time: 01/09/19 11:13 AM  Result Value Ref Range Status   Specimen Description BLOOD LEFT ARM BOTTLES DRAWN AEROBIC AND ANAEROBIC  Final   Special Requests Blood Culture adequate volume  Final   Culture   Final    NO GROWTH 5 DAYS Performed at [facility], [address], [place], [place] [address]    Report Status 01/14/2019 FINAL  Final  Blood Culture (routine x 2)  Status: None   Collection Time: 01/09/19 11:35 AM  Result Value Ref Range Status   Specimen Description   Final    LEFT ANTECUBITAL BOTTLES DRAWN AEROBIC AND ANAEROBIC   Special Requests Blood Culture adequate volume  Final   Culture   Final    NO GROWTH 5 DAYS Performed at [facility], [address] [place], [place] [address]    Report Status 01/14/2019 FINAL  Final  SARS Coronavirus 2 (CEPHEID - Performed in [place] hospital lab), Hosp Order     Status: None   Collection Time: 01/09/19  2:58 PM  Result Value Ref Range Status   SARS Coronavirus 2 NEGATIVE NEGATIVE Final    Comment: (NOTE) If result is NEGATIVE SARS-CoV-2 target nucleic acids are NOT DETECTED. The SARS-CoV-2 RNA is generally detectable in upper and lower  respiratory specimens during the acute phase of infection. The lowest  concentration of SARS-CoV-2 viral copies this assay can detect is 250  copies / mL. A negative result does not preclude SARS-CoV-2 infection  and should not be used as the  sole basis for treatment or other  patient management decisions.  A negative result may occur with  improper specimen collection / handling, submission of specimen other  than nasopharyngeal swab, presence of viral mutation(s) within the  areas targeted by this assay, and inadequate number of viral copies  (<250 copies / mL). A negative result must be combined with clinical  observations, patient history, and epidemiological information. If result is POSITIVE SARS-CoV-2 target nucleic acids are DETECTED. The SARS-CoV-2 RNA is generally detectable in upper and lower  respiratory specimens dur ing the acute phase of infection.  Positive  results are indicative of active infection with SARS-CoV-2.  Clinical  correlation with patient history and other diagnostic information is  necessary to determine patient infection status.  Positive results do  not rule out bacterial infection or co-infection with other viruses. If result is PRESUMPTIVE POSTIVE SARS-CoV-2 nucleic acids MAY BE PRESENT.   A presumptive positive result was obtained on the submitted specimen  and confirmed on repeat testing.  While 2019 novel coronavirus  (SARS-CoV-2) nucleic acids may be present in the submitted sample  additional confirmatory testing may be necessary for epidemiological  and / or clinical management purposes  to differentiate between  SARS-CoV-2 and other Sarbecovirus currently known to infect humans.  If clinically indicated additional testing with an alternate test  methodology [phone]) is advised. The SARS-CoV-2 RNA is generally  detectable in upper and lower respiratory sp ecimens during the acute  phase of infection. The expected result is Negative. Fact Sheet for Patients:  StrictlyIdeas.no Fact Sheet for Healthcare Providers: BankingDealers.co.za This test is not yet approved or cleared by the [place] FDA and has been authorized for detection  and/or diagnosis of SARS-CoV-2 by FDA under an Emergency Use Authorization (EUA).  This EUA will remain in effect (meaning this test can be used) for the duration of the COVID-19 declaration under Section 564(b)(1) of the Act, 21 U.S.C. section 360bbb-3(b)(1), unless the authorization is terminated or revoked sooner. Performed at [facility], [address] [place], [place] [address]   MRSA PCR Screening     Status: Abnormal   Collection Time: 01/09/19  8:47 PM  Result Value Ref Range Status   MRSA by PCR POSITIVE (A) NEGATIVE Final    Comment:        The GeneXpert MRSA Assay (FDA approved for NASAL specimens only), is one component of a  comprehensive MRSA colonization surveillance program. It is not intended to diagnose MRSA infection nor to guide or monitor treatment for MRSA infections. RESULT CALLED TO, READ BACK BY AND VERIFIED WITH: TETREAULT,H @ 0007 ON 01/10/19 BY JUW Performed at [facility], [address] [place], [place] [address]   Culture, respiratory     Status: None   Collection Time: 01/09/19 11:39 PM  Result Value Ref Range Status   Specimen Description   Final    TRACHEAL ASPIRATE Performed at [facility], [address] [place], [place] [address]    Special Requests   Final    NONE Performed at [facility], [address] [place], [place] [address]    Gram Stain   Final    MODERATE WBC PRESENT, PREDOMINANTLY PMN RARE GRAM POSITIVE COCCI RARE YEAST Performed at [facility], [address] [place], [place] [address]    Culture   Final    MODERATE METHICILLIN RESISTANT STAPHYLOCOCCUS AUREUS   Report Status 01/12/2019 FINAL  Final   Organism ID, Bacteria METHICILLIN RESISTANT STAPHYLOCOCCUS AUREUS  Final      Susceptibility   Methicillin resistant staphylococcus aureus - MIC*    CIPROFLOXACIN >=8 RESISTANT Resistant     ERYTHROMYCIN >=8 RESISTANT Resistant     GENTAMICIN <=0.5 SENSITIVE Sensitive     OXACILLIN >=4 RESISTANT Resistant      TETRACYCLINE <=1 SENSITIVE Sensitive     VANCOMYCIN 1 SENSITIVE Sensitive     TRIMETH/SULFA <=10 SENSITIVE Sensitive     CLINDAMYCIN <=0.25 SENSITIVE Sensitive     RIFAMPIN <=0.5 SENSITIVE Sensitive     Inducible Clindamycin NEGATIVE Sensitive     * MODERATE METHICILLIN RESISTANT STAPHYLOCOCCUS AUREUS   Studies/Results: No results found.  Medications:  Prior to Admission:  Medications Prior to Admission  Medication Sig Dispense Refill Last Dose  . acetaminophen (TYLENOL) 500 MG tablet Take 1,000 mg by mouth every 6 (six) hours as needed. Pain   Past Week at Unknown  . allopurinol (ZYLOPRIM) 300 MG tablet Take 300 mg by mouth daily.   01/08/2019 at Unknown time  . aspirin 81 MG chewable tablet Chew 81 mg by mouth daily.   01/08/2019 at 0930  . busPIRone (BUSPAR) 15 MG tablet Take 15 mg by mouth 2 (two) times daily.   01/08/2019 at Unknown time  . calcium gluconate 500 MG tablet Take 1 tablet by mouth daily.   01/08/2019 at Unknown time  . citalopram (CELEXA) 20 MG tablet Take 20 mg by mouth daily.   01/08/2019 at Unknown time  . folic acid (FOLVITE) 1 MG tablet Take 1 mg by mouth daily.   01/08/2019 at Unknown time  . gabapentin (NEURONTIN) 300 MG capsule Take 300 mg by mouth 3 (three) times daily.   01/08/2019 at Unknown time  . Melatonin 5 MG TABS Take 1 tablet by mouth at bedtime.   01/08/2019 at Unknown time  . midodrine (PROAMATINE) 2.5 MG tablet Take 2.5 mg by mouth 2 (two) times a day.   01/08/2019 at Unknown time  . Omega-3 Fatty Acids (FISH OIL) 1000 MG CAPS Take 1 capsule by mouth 2 (two) times a day.   01/08/2019 at Unknown time  . omeprazole (PRILOSEC) 20 MG capsule Take 20 mg by mouth daily.   01/08/2019 at Unknown time  . Tamsulosin HCl (FLOMAX) 0.4 MG CAPS Take 0.4 mg by mouth daily.    01/08/2019 at Unknown time  . traMADol (ULTRAM) 50 MG tablet Take 50 mg by mouth 3 (  three) times daily.   01/08/2019 at Unknown time  . vitamin B-12 (CYANOCOBALAMIN) 1000 MCG tablet Take 1,000 mcg by mouth daily.    01/08/2019 at Unknown time  . ciprofloxacin (CIPRO) 500 MG tablet Take 500 mg by mouth 2 (two) times daily.   Completed Course at Unknown time   Scheduled: . acetylcysteine  3 mL Nebulization Q4H  . albuterol  2.5 mg Nebulization Q4H  . Chlorhexidine Gluconate Cloth  6 each Topical Q0600  . collagenase   Topical Daily  . sodium chloride flush  10-40 mL Intracatheter Q12H   Continuous: . sodium chloride Stopped (01/12/19 1717)  . vancomycin 1,750 mg (01/16/19 1514)   NHA:FBXUXY chloride, acetaminophen, hydrALAZINE, ipratropium-albuterol, ondansetron (ZOFRAN) IV, sodium chloride flush  Assesment: He was admitted with acute metabolic encephalopathy.  This appears to be related to healthcare associated pneumonia with sepsis.  He briefly required pressor support.  He also had acute heart failure at that time.  He was discovered to have left lower lobe pneumonia.  He had acute hypoxic and hypercapnic respiratory failure requiring intubation and mechanical ventilation.  He was able to be extubated approximately 72 hours ago.  He has had abnormal liver function testing and thrombocytopenia probably related to sepsis.  Sepsis pathophysiology has resolved  He has COPD at baseline which is stable  His encephalopathy is better  He had pressure injury on his right heel and had been in the hospital previously with cellulitis and that does seem to be better  He is generally improved and plans are for him to go to skilled care facility at discharge Principal Problem:   HCAP (healthcare-associated pneumonia) Active Problems:   Sepsis with acute hypoxic respiratory failure (HCC)   Hypotension   Abnormal liver function   Pressure injury of skin   Sepsis due to undetermined organism ([place])   Acute respiratory failure with hypoxia (HCC)   Thrombocytopenia ([place])   Acute CHF (congestive heart failure) ([place])   Altered mental status   Acute metabolic encephalopathy   Goals of care,  counseling/discussion   Palliative care encounter   Pancytopenia ([place])   Pneumonia of left lower lobe due to infectious organism [facility])    Plan: Since he is substantially improved I will plan to sign off.  Thanks for allowing me to see him with you.  I am available of course if he has more trouble    LOS: 8 days   Alonza Bogus 01/17/2019, 7:38 AM

## 2019-01-17 NOTE — Discharge Summary (Addendum)
Physician Discharge Summary  John Munoz DXA:[id number] DOB: 17-Mar-1933 DOA: 01/09/2019  PCP: Neale Burly, MD  Admit date: 01/09/2019 Discharge date: 01/18/2019  Admitted From: home Disposition:  SNF   Recommendations for Outpatient Follow-up:  1. Stop Bactrim on 5/21 2. F/u platelets in 1 wk   Discharge Condition:  stable   CODE STATUS:  DNR   Diet recommendation: heart healthy Consultations:  pulmonary    Discharge Diagnoses:  Principal Problem:   HCAP (healthcare-associated pneumonia)- MRSA Pneumonia of left lower lobe due to infectious organism  Active Problems:   Sepsis with acute hypoxic respiratory failure ([facility])   Acute respiratory failure with hypoxia ([facility])   Thrombocytopenia ([facility])   Acute CHF (congestive heart failure) ([place])   Acute metabolic encephalopathy   Goals of care, counseling/discussion   Palliative care encounter       Brief Summary: John Munoz is a 85 y.o.male from home with great neicewith medical history significant forCOPD, depression, BPH, recent right heel pressure ulcer with cellulitis and necrosis, recent UTI, brought to the ED with reports of altered mental status, lethargic of 2 days duration.  In ED, temp 91.1, tachypnea, CT chest> left > right lower lobe infiltrates. Started on  Vanc and Cefepime for HCAP and intubated in ED. COVID neg. MRSA screen +.  Admitted for HCAP, acute diastolic CHF, acute encephalopathy. Made DNR the following day. Venous duplex 01/10/19- no DVT  Hospital Course:  Sepsis- hypothermia, hypotension   Acute respiratory failure with hypoxia due to MRSA HCAP and acute diastolic CHF - treated with on Vanc, Flagyl and Cefepime- respiratory culture reveals moderate MRSA -   - he has been weaned off of Levophed - d/c'd stress dose steroids- BP quite stable - 2 D ECHO reveals normal EF- ? If he has diastolic dysfunction- I am trying to keep him on the dry side - he was successfully extubated 5/15 and has been  steadily weaned off of O2 to room air - today is day 8 of antibiotics- he still has quite a bit of sputum therefore, I will continue antibiotics for total of 10 days -   switched to Bactrim to complete a 10 day course   Active Problems:  FEN - has passed swallow eval and has been started on a diet which he is tolerating well  Anemia - Hb 7.2 on 5/1 for which he was transfused 2 U PRBC- anemia panel was consistent with AOCD and no GI bleed was noted  Thrombocytopenia - acute- ? If secondary to acute infection- will need to repeat CBC in 1 wk  Electrolyte abnormalities - low K, Mag and phos- have been adequately replaced     Abnormal liver function/ thrombocytopenia - suspected to be due to sepsis- Hepatitis panel negative - noted to be improving    Pressure injury of skin- stage 3 left heel - appreciate wound care eval - recommendations> Enzymatic debridement and saline moist gauze. Cover with dry dressing. Change daily.      Discharge Exam: Vitals:   01/18/19 0735 01/18/19 0857  BP:    Pulse: 77   Resp: 17   Temp: (!) 97.4 F (36.3 C)   SpO2: 99% 97%   Vitals:   01/18/19 0400 01/18/19 0500 01/18/19 0735 01/18/19 0857  BP: 123/65 127/71    Pulse: 81 80 77   Resp: 17 16 17    Temp:  (!) 97.3 F (36.3 C) (!) 97.4 F (36.3 C)   TempSrc:  Oral Oral  SpO2: 99% 99% 99% 97%  Weight:  72.1 kg    Height:        General: Pt is alert, awake, not in acute distress Cardiovascular: RRR, S1/S2 +, no rubs, no gallops Respiratory: CTA bilaterally, no wheezing, no rhonchi Abdominal: Soft, NT, ND, bowel sounds + Extremities: no edema, no cyanosis   Discharge Instructions  Discharge Instructions    Diet - low sodium heart healthy   Complete by:  As directed    Increase activity slowly   Complete by:  As directed      Allergies as of 01/18/2019   No Known Allergies     Medication List    STOP taking these medications   busPIRone 15 MG tablet Commonly known  as:  BUSPAR   ciprofloxacin 500 MG tablet Commonly known as:  CIPRO   gabapentin 300 MG capsule Commonly known as:  NEURONTIN   Melatonin 5 MG Tabs   traMADol 50 MG tablet Commonly known as:  ULTRAM     TAKE these medications   acetaminophen 500 MG tablet Commonly known as:  TYLENOL Take 1,000 mg by mouth every 6 (six) hours as needed. Pain   allopurinol 300 MG tablet Commonly known as:  ZYLOPRIM Take 300 mg by mouth daily.   aspirin 81 MG chewable tablet Chew 81 mg by mouth daily.   calcium gluconate 500 MG tablet Take 1 tablet by mouth daily.   citalopram 20 MG tablet Commonly known as:  CELEXA Take 20 mg by mouth daily.   Fish Oil 1000 MG Caps Take 1 capsule by mouth 2 (two) times a day.   folic acid 1 MG tablet Commonly known as:  FOLVITE Take 1 mg by mouth daily.   midodrine 2.5 MG tablet Commonly known as:  PROAMATINE Take 2.5 mg by mouth 2 (two) times a day.   omeprazole 20 MG capsule Commonly known as:  PRILOSEC Take 20 mg by mouth daily.   sulfamethoxazole-trimethoprim 400-80 MG tablet Commonly known as:  Bactrim Take 1 tablet by mouth 2 (two) times daily.   tamsulosin 0.4 MG Caps capsule Commonly known as:  FLOMAX Take 0.4 mg by mouth daily.   vitamin B-12 1000 MCG tablet Commonly known as:  CYANOCOBALAMIN Take 1,000 mcg by mouth daily.       No Known Allergies   Procedures/Studies: 2 D ECHO 1. The left ventricle has normal systolic function, with an ejection fraction of 55-60%. The cavity size was normal. There is mild concentric left ventricular hypertrophy. Left ventricular diastolic Doppler parameters are indeterminate. No evidence of  left ventricular regional wall motion abnormalities. 2. The right ventricle has normal systolic function. The cavity was normal. There is no increase in right ventricular wall thickness. 3. Left atrial size was mildly dilated. 4. The mitral valve is grossly normal. Mild thickening of the mitral  valve leaflet. There is mild to moderate mitral annular calcification present. 5. The tricuspid valve is grossly normal. 6. The aortic valve is tricuspid. Mild thickening of the aortic valve. Mild calcification of the aortic valve. Aortic valve regurgitation is trivial by color flow Doppler. 7. Moderate aortic annular calcification. 8. The aortic root is normal in size and structure. 9. The inferior vena cava was dilated in size with <50% respiratory variability.  Ct Head Wo Contrast  Result Date: 01/09/2019 CLINICAL DATA:  85 year old male with altered mental status. EXAM: CT HEAD WITHOUT CONTRAST TECHNIQUE: Contiguous axial images were obtained from the base of the skull through the vertex  without intravenous contrast. COMPARISON:  02/28/2017 CT and prior studies FINDINGS: Brain: No evidence of acute infarction, hemorrhage, hydrocephalus, extra-axial collection or mass lesion/mass effect. Atrophy and chronic small-vessel white matter ischemic changes again noted. Vascular: Atherosclerotic calcifications again noted Skull: Normal. Negative for fracture or focal lesion. Sinuses/Orbits: No acute finding. Other: None. IMPRESSION: 1. No evidence of acute intracranial abnormality. 2. Atrophy and chronic small-vessel white matter ischemic changes. Electronically Signed   By: Margarette Canada M.D.   On: 01/09/2019 15:20   Ct Chest W Contrast  Result Date: 01/09/2019 CLINICAL DATA:  Cough and pleural effusion EXAM: CT CHEST WITH CONTRAST TECHNIQUE: Multidetector CT imaging of the chest was performed during intravenous contrast administration. CONTRAST:  29mL OMNIPAQUE 300 COMPARISON:  Plain film from earlier in the same day. FINDINGS: Cardiovascular: Thoracic aorta and its branches demonstrate atherosclerotic calcifications without aneurysmal dilatation or dissection. Diffuse coronary calcifications are seen. The heart is at the upper limits of normal in size. Pulmonary artery is well visualized. No large  central pulmonary embolus is seen. Timing was not performed for embolus evaluation. Mediastinum/Nodes: The esophagus as visualized is within normal limits. Thoracic inlet is within normal limits. Scattered small mediastinal lymph nodes are seen likely reactive in nature. Lungs/Pleura: Mild right lower lobe atelectatic changes are noted with small right pleural effusion. A larger left-sided pleural effusion is noted with associated lower lobe infiltrate with air bronchograms consistent with pneumonia. These changes are similar to that seen on recent plain film. No parenchymal nodules are seen. Upper Abdomen: Prior cholecystectomy is noted. No acute abnormality is noted within the abdomen. Musculoskeletal: Degenerative changes of the thoracic spine are noted. IMPRESSION: Bilateral pleural effusions left greater than right. Left lower lobe infiltrate consistent with acute pneumonia. Mild right basilar atelectasis is seen. Aortic Atherosclerosis (ICD10-I70.0). Electronically Signed   By: Inez Catalina M.D.   On: 01/09/2019 15:17   US Venous Img Lower Bilateral  Result Date: 01/10/2019 CLINICAL DATA:  Bilateral lower extremity pain and edema. Evaluate for DVT. EXAM: BILATERAL LOWER EXTREMITY VENOUS DOPPLER ULTRASOUND TECHNIQUE: Gray-scale sonography with graded compression, as well as color Doppler and duplex ultrasound were performed to evaluate the lower extremity deep venous systems from the level of the common femoral vein and including the common femoral, femoral, profunda femoral, popliteal and calf veins including the posterior tibial, peroneal and gastrocnemius veins when visible. The superficial great saphenous vein was also interrogated. Spectral Doppler was utilized to evaluate flow at rest and with distal augmentation maneuvers in the common femoral, femoral and popliteal veins. COMPARISON:  None. FINDINGS: RIGHT LOWER EXTREMITY The right common femoral vein, saphenofemoral junction and deep femoral vein  were unable to be visualized secondary to central venous catheter bandages. Femoral Vein: No evidence of thrombus. Normal compressibility, respiratory phasicity and response to augmentation. Popliteal Vein: No evidence of thrombus. Normal compressibility, respiratory phasicity and response to augmentation. Calf Veins: No evidence of thrombus. Normal compressibility and flow on color Doppler imaging. Superficial Great Saphenous Vein: No evidence of thrombus. Normal compressibility. Venous Reflux:  None. Other Findings: Moderate amount of subcutaneous edema is noted at the level of the right thigh and lower leg. LEFT LOWER EXTREMITY Common Femoral Vein: No evidence of thrombus. Normal compressibility, respiratory phasicity and response to augmentation. Saphenofemoral Junction: No evidence of thrombus. Normal compressibility and flow on color Doppler imaging. Profunda Femoral Vein: No evidence of thrombus. Normal compressibility and flow on color Doppler imaging. Femoral Vein: No evidence of thrombus. Normal compressibility, respiratory phasicity and response  to augmentation. Popliteal Vein: No evidence of thrombus. Normal compressibility, respiratory phasicity and response to augmentation. Calf Veins: No evidence of thrombus. Normal compressibility and flow on color Doppler imaging. Superficial Great Saphenous Vein: No evidence of thrombus. Normal compressibility. Venous Reflux:  None. Other Findings: Moderate amount of subcutaneous edema is noted at the level of the left lower leg. IMPRESSION: Degraded examination without evidence of DVT within either lower extremity. Electronically Signed   By: Sandi Mariscal M.D.   On: 01/10/2019 13:58   Dg Chest Port 1 View  Result Date: 01/13/2019 CLINICAL DATA:  85 year old male with respiratory failure, smoker. Negative for COVID-19 on 01/09/2019. EXAM: PORTABLE CHEST 1 VIEW COMPARISON:  01/12/2019 and earlier. FINDINGS: Portable AP semi upright view at at 0525 hours.  Endotracheal tube tip between the level the clavicles and carina. Stable visible enteric tube and right PICC line. Stable lung volumes and ventilation with continued dense left lung base opacity obscuring the diaphragm and patchy/confluent multifocal right lower lung opacity. Stable increased interstitial markings in both lungs which might reflect vascular congestion on chronic lung disease. Stable cardiac size and mediastinal contours. Paucity of bowel gas in the upper abdomen. IMPRESSION: 1. Stable lines and tubes. 2. Stable ventilation with continued dense left lower lobe collapse or consolidation and patchy right lung base opacity. 3. Suspect pulmonary vascular congestion superimposed on chronic interstitial lung disease. Electronically Signed   By: Genevie Ann M.D.   On: 01/13/2019 08:39   Dg Chest Port 1 View  Result Date: 01/12/2019 CLINICAL DATA:  85 year old male with a history PICC placement EXAM: PORTABLE CHEST 1 VIEW COMPARISON:  01/12/2019 FINDINGS: Interval placement of right upper extremity PICC which appears to terminate near the superior cavoatrial junction. Cardiomediastinal silhouette likely unchanged in size and contour. Heart borders partially obscured by lung/pleural disease. Persistently low lung volumes with mixed interstitial and airspace opacities most prominent at the lung bases. Obscuration left hemidiaphragm in the left heart border persists. No change in aeration. No pneumothorax. Endotracheal tube terminates 4.8 cm above the carina. Gastric tube terminates in the left upper quadrant. IMPRESSION: Interval placement of right upper extremity PICC which appears to terminate superior cavoatrial junction. Similar appearance of mixed interstitial and airspace disease with likely left greater than right pleural effusion and associated atelectasis/consolidation. Unchanged endotracheal tube and gastric tube. Electronically Signed   By: Corrie Mckusick D.O.   On: 01/12/2019 17:01   Dg Chest Port  1 View  Result Date: 01/12/2019 CLINICAL DATA:  85 year old male NG tube placement. EXAM: PORTABLE CHEST 1 VIEW COMPARISON:  0046 hours today and earlier. FINDINGS: Portable AP semi upright view at 0810 hours. Enteric tube in place and courses to the abdomen. Tip is not included, but the side hole might be visible near the GE junction. Stable lung volumes and ventilation. Stable cardiac size and mediastinal contours. Endotracheal tube tip is not as well visualized but likely stable at the level the clavicles. IMPRESSION: 1. Enteric tube courses to the abdomen. Tip is not included, but the side hole might be visible near the GE junction. Consider advancing 5 cm to ensure side hole placement within the stomach. 2. Otherwise stable chest. Electronically Signed   By: Genevie Ann M.D.   On: 01/12/2019 08:26   Dg Chest Port 1 View  Result Date: 01/12/2019 CLINICAL DATA:  85 year old male with respiratory failure. EXAM: PORTABLE CHEST 1 VIEW COMPARISON:  Chest radiograph dated 01/11/2019 FINDINGS: Evaluation is limited due to patient's positioning and superimposition of the  mandible over the upper mediastinum. The tip of the endotracheal tube is poorly visualized but appears in similar position as before. An enteric tube extends into the left upper abdomen with side-port in the region of the gastroesophageal junction. Recommend further advancing the tube into the stomach by at least additional 5 7 cm. Cardiomegaly with vascular congestion and edema. Left lung base density. Overall slight interval improvement in the aeration of the left lung compared to the prior radiograph. No pneumothorax. No acute osseous pathology. IMPRESSION: 1. Slight interval improvement in the aeration of the left lung compared to the prior radiograph. 2. Cardiomegaly with vascular congestion and edema. 3. Left lung base density, not significantly changed. 4. Lines and tubes as described above. The enteric tube can be advanced for optimal  positioning. Electronically Signed   By: Anner Crete M.D.   On: 01/12/2019 01:24   Dg Chest Port 1 View  Result Date: 01/11/2019 CLINICAL DATA:  Shortness of breath.  Respiratory failure. EXAM: PORTABLE CHEST 1 VIEW COMPARISON:  01/10/2019 FINDINGS: The endotracheal tube terminates above the carina by approximately 4.5 cm. The enteric tube it peers to extend below the left hemidiaphragm. The cardiac silhouette is enlarged. Aortic calcifications are noted. There is a left basilar/retrocardiac opacity, similar to prior study. No pneumothorax. Prominent interstitial lung markings are again noted IMPRESSION: 1. Lines and tubes as above. 2. Persistent left basilar airspace opacity, not significantly improved from prior study. Electronically Signed   By: Constance Holster M.D.   On: 01/11/2019 13:51   Dg Chest Port 1 View  Result Date: 01/10/2019 CLINICAL DATA:  Altered mental status. Pneumonia. EXAM: PORTABLE CHEST 1 VIEW COMPARISON:  01/10/2019. FINDINGS: Rotated radiograph. Endotracheal tube appears approximately 2 cm above carina. Enteric tube last side hole is at GE junction. The heart is enlarged. There is considerable opacity in the LEFT hemithorax, probably stable, likely atelectasis, consolidation, and effusion. Moderate vascular congestion appears worse throughout the visible RIGHT lung fields. IMPRESSION: Worsening aeration. Support tubes as described. Electronically Signed   By: Staci Righter M.D.   On: 01/10/2019 10:29   Dg Chest Port 1 View  Result Date: 01/10/2019 CLINICAL DATA:  85 year old male with enteric tube placement. EXAM: PORTABLE CHEST 1 VIEW COMPARISON:  Chest radiograph dated 01/09/2019 FINDINGS: Evaluation is limited due to patient's positioning and portable technique. An enteric tube is noted with side-port just distal to the region of the gastroesophageal junction and tip in the proximal stomach. An endotracheal tube appears above the carina. No significant change in the  appearance of the lungs or heart since the earlier radiograph. IMPRESSION: Enteric tube with side-port just distal to the GE junction and tip in the proximal stomach. The tube can be further advanced by an additional 5-7 cm for optimal positioning. Electronically Signed   By: Anner Crete M.D.   On: 01/10/2019 02:49   Dg Chest Port 1 View  Result Date: 01/09/2019 CLINICAL DATA:  85 year old male status post enteric tube placement. EXAM: PORTABLE CHEST 1 VIEW COMPARISON:  Earlier radiograph dated 01/09/2019 FINDINGS: Endotracheal tube with tip above the carina in similar position. An enteric tube extends into the upper abdomen with tip likely in the proximal stomach. The side port of the enteric tube is in the region of the GE junction. Recommend further advancing of the tube by approximately 5 cm. Left-sided pleural effusion and associated atelectasis or infiltrate similar or slightly progressed since the prior radiograph. No other interval change. IMPRESSION: Enteric tube with side-port in the  region of the GE junction and tip in the proximal stomach. Recommend further advancing the tube by approximately 5 cm. Electronically Signed   By: Anner Crete M.D.   On: 01/09/2019 23:47   Dg Chest Portable 1 View  Result Date: 01/09/2019 CLINICAL DATA:  85 year old male intubated. Left lower lobe consolidation, bilateral pleural effusions. EXAM: PORTABLE CHEST 1 VIEW COMPARISON:  Chest CT and radiographs earlier today FINDINGS: Portable AP supine view at 1759 hours. Endotracheal tube tip in good position between the level the clavicles and carina. Enteric tube courses to the left abdomen, tip not included. The side hole might be visible at the level of the distal esophagus (arrow). Dense left lung base opacification. Stable cardiac size and mediastinal contours. No pneumothorax. Stable pulmonary vascularity. No confluent right lung opacity. IMPRESSION: 1. ET tube in good position. 2. Enteric tube courses to  the left abdomen but the side hole might still be in the distal esophagus. Recommend advancing 5-6 more centimeters. 3. Left lower consolidation and effusion as seen by CT earlier today. Electronically Signed   By: Genevie Ann M.D.   On: 01/09/2019 18:22   Dg Chest Port 1 View  Result Date: 01/09/2019 CLINICAL DATA:  Altered mental status. EXAM: PORTABLE CHEST 1 VIEW COMPARISON:  Radiographs of December 01, 2018. FINDINGS: Stable cardiomegaly. Atherosclerosis of thoracic aorta is noted. No pneumothorax is noted. Bilateral interstitial densities are noted concerning for pulmonary edema. Mild left pleural effusion is noted with probable associated atelectasis or infiltrate. Bony thorax is unremarkable. IMPRESSION: Stable cardiomegaly with bilateral interstitial densities concerning for edema. Mild left pleural effusion is noted with associated atelectasis or infiltrate. Aortic Atherosclerosis (ICD10-I70.0). Electronically Signed   By: Marijo Conception M.D.   On: 01/09/2019 11:50   [place] Ekg Site Rite  Result Date: 01/12/2019 If Site Rite image not attached, placement could not be confirmed due to current cardiac rhythm.  US Abdomen Limited Ruq  Result Date: 01/10/2019 CLINICAL DATA:  Abnormal LFTs on admission. Previous cholecystectomy. EXAM: ULTRASOUND ABDOMEN LIMITED RIGHT UPPER QUADRANT COMPARISON:  None. FINDINGS: Gallbladder: Previous cholecystectomy. Common bile duct: Diameter: 8.1 mm. Liver: No focal lesion identified. Within normal limits in parenchymal echogenicity. Small amount of perihepatic fluid. Portal vein is patent on color Doppler imaging with normal direction of blood flow towards the liver. IMPRESSION: Small amount of perihepatic fluid, otherwise no acute findings. Previous cholecystectomy. Electronically Signed   By: Marin Olp M.D.   On: 01/10/2019 13:27     The results of significant diagnostics from this hospitalization (including imaging, microbiology, ancillary and laboratory) are listed  below for reference.     Microbiology: Recent Results (from the past 240 hour(s))  Blood Culture (routine x 2)     Status: None   Collection Time: 01/09/19 11:13 AM  Result Value Ref Range Status   Specimen Description BLOOD LEFT ARM BOTTLES DRAWN AEROBIC AND ANAEROBIC  Final   Special Requests Blood Culture adequate volume  Final   Culture   Final    NO GROWTH 5 DAYS Performed at [facility], [address], [place], [place] [address]    Report Status 01/14/2019 FINAL  Final  Blood Culture (routine x 2)     Status: None   Collection Time: 01/09/19 11:35 AM  Result Value Ref Range Status   Specimen Description   Final    LEFT ANTECUBITAL BOTTLES DRAWN AEROBIC AND ANAEROBIC   Special Requests Blood Culture adequate volume  Final   Culture   Final  NO GROWTH 5 DAYS Performed at [facility], [address] [place], [place] [address]    Report Status 01/14/2019 FINAL  Final  SARS Coronavirus 2 (CEPHEID - Performed in [place] hospital lab), Hosp Order     Status: None   Collection Time: 01/09/19  2:58 PM  Result Value Ref Range Status   SARS Coronavirus 2 NEGATIVE NEGATIVE Final    Comment: (NOTE) If result is NEGATIVE SARS-CoV-2 target nucleic acids are NOT DETECTED. The SARS-CoV-2 RNA is generally detectable in upper and lower  respiratory specimens during the acute phase of infection. The lowest  concentration of SARS-CoV-2 viral copies this assay can detect is 250  copies / mL. A negative result does not preclude SARS-CoV-2 infection  and should not be used as the sole basis for treatment or other  patient management decisions.  A negative result may occur with  improper specimen collection / handling, submission of specimen other  than nasopharyngeal swab, presence of viral mutation(s) within the  areas targeted by this assay, and inadequate number of viral copies  (<250 copies / mL). A negative result must be combined with clinical  observations, patient history,  and epidemiological information. If result is POSITIVE SARS-CoV-2 target nucleic acids are DETECTED. The SARS-CoV-2 RNA is generally detectable in upper and lower  respiratory specimens dur ing the acute phase of infection.  Positive  results are indicative of active infection with SARS-CoV-2.  Clinical  correlation with patient history and other diagnostic information is  necessary to determine patient infection status.  Positive results do  not rule out bacterial infection or co-infection with other viruses. If result is PRESUMPTIVE POSTIVE SARS-CoV-2 nucleic acids MAY BE PRESENT.   A presumptive positive result was obtained on the submitted specimen  and confirmed on repeat testing.  While 2019 novel coronavirus  (SARS-CoV-2) nucleic acids may be present in the submitted sample  additional confirmatory testing may be necessary for epidemiological  and / or clinical management purposes  to differentiate between  SARS-CoV-2 and other Sarbecovirus currently known to infect humans.  If clinically indicated additional testing with an alternate test  methodology [phone]) is advised. The SARS-CoV-2 RNA is generally  detectable in upper and lower respiratory sp ecimens during the acute  phase of infection. The expected result is Negative. Fact Sheet for Patients:  StrictlyIdeas.no Fact Sheet for Healthcare Providers: BankingDealers.co.za This test is not yet approved or cleared by the [place] FDA and has been authorized for detection and/or diagnosis of SARS-CoV-2 by FDA under an Emergency Use Authorization (EUA).  This EUA will remain in effect (meaning this test can be used) for the duration of the COVID-19 declaration under Section 564(b)(1) of the Act, 21 U.S.C. section 360bbb-3(b)(1), unless the authorization is terminated or revoked sooner. Performed at [facility], [address], [place], [place] [address]   MRSA PCR  Screening     Status: Abnormal   Collection Time: 01/09/19  8:47 PM  Result Value Ref Range Status   MRSA by PCR POSITIVE (A) NEGATIVE Final    Comment:        The GeneXpert MRSA Assay (FDA approved for NASAL specimens only), is one component of a comprehensive MRSA colonization surveillance program. It is not intended to diagnose MRSA infection nor to guide or monitor treatment for MRSA infections. RESULT CALLED TO, READ BACK BY AND VERIFIED WITH: TETREAULT,H @ 0007 ON 01/10/19 BY JUW Performed at [facility], [address] [place], [place] [address]  Culture, respiratory     Status: None   Collection Time: 01/09/19 11:39 PM  Result Value Ref Range Status   Specimen Description   Final    TRACHEAL ASPIRATE Performed at [facility], [address], [place], [place] [address]    Special Requests   Final    NONE Performed at [facility], [address], [place], [place] [address]    Gram Stain   Final    MODERATE WBC PRESENT, PREDOMINANTLY PMN RARE GRAM POSITIVE COCCI RARE YEAST Performed at [facility], [place] [address], [place], [place] [address]    Culture   Final    MODERATE METHICILLIN RESISTANT STAPHYLOCOCCUS AUREUS   Report Status 01/12/2019 FINAL  Final   Organism ID, Bacteria METHICILLIN RESISTANT STAPHYLOCOCCUS AUREUS  Final      Susceptibility   Methicillin resistant staphylococcus aureus - MIC*    CIPROFLOXACIN >=8 RESISTANT Resistant     ERYTHROMYCIN >=8 RESISTANT Resistant     GENTAMICIN <=0.5 SENSITIVE Sensitive     OXACILLIN >=4 RESISTANT Resistant     TETRACYCLINE <=1 SENSITIVE Sensitive     VANCOMYCIN 1 SENSITIVE Sensitive     TRIMETH/SULFA <=10 SENSITIVE Sensitive     CLINDAMYCIN <=0.25 SENSITIVE Sensitive     RIFAMPIN <=0.5 SENSITIVE Sensitive     Inducible Clindamycin NEGATIVE Sensitive     * MODERATE METHICILLIN RESISTANT STAPHYLOCOCCUS AUREUS     Labs: BNP (last 3 results) Recent Labs    01/09/19 1113  BNP 1,175.0*   Basic  Metabolic Panel: Recent Labs  Lab 01/12/19 0525  01/13/19 0458 01/13/19 0859  01/14/19 0424 01/14/19 0812 01/14/19 1130 01/15/19 0448 01/16/19 0416 01/17/19 0354  NA 140   < >  --  138   < > 137 138  --  139 137 137  K 3.0*   < >  --  2.7*   < > 3.2* 3.4*  --  3.0* 3.2* 3.5  CL 105   < >  --  97*   < > 99 100  --  103 103 101  CO2 25   < >  --  29   < > 30 30  --  28 26 27   GLUCOSE 186*   < >  --  120*   < > 125* 132*  --  81 77 99  BUN 22   < >  --  17   < > 23 23  --  21 16 14   CREATININE 0.59*   < >  --  0.60*   < > 0.49*  0.55* 0.51*  --  0.48* 0.42* 0.43*  CALCIUM 9.0   < >  --  8.5*   < > 8.3* 8.3*  --  8.2* 8.1* 8.4*  MG 1.6*  --  1.6* 1.6*  --   --   --  1.9 1.6* 1.9  --   PHOS 1.7*  --  3.2 3.4  --   --   --   --   --   --   --    < > = values in this interval not displayed.   Liver Function Tests: Recent Labs  Lab 01/12/19 0525 01/12/19 1708  AST 56* 44*  ALT 49* 44  ALKPHOS 92 90  BILITOT 0.7 1.0  PROT 5.9* 5.7*  ALBUMIN 2.7* 2.6*   No results for input(s): LIPASE, AMYLASE in the last 168 hours. No results for input(s): AMMONIA in the last 168 hours. CBC: Recent Labs  Lab 01/12/19 0525 01/13/19 0458 01/14/19 0424 01/14/19 0812  WBC 10.1 7.1 5.2 5.0  HGB 11.0* 10.3* 9.6* 9.1*  HCT 32.7* 30.2* 28.6* 28.0*  MCV 94.2 93.2 95.3 96.9  PLT 63* 66* 71* 72*   Cardiac Enzymes: No results for input(s): CKTOTAL, CKMB, CKMBINDEX, TROPONINI in the last 168 hours. BNP: Invalid input(s): POCBNP CBG: Recent Labs  Lab 01/13/19 2110 01/13/19 2352 01/14/19 0431 01/14/19 0744 01/14/19 1107  GLUCAP 132* 129* 132* 132* 116*   D-Dimer No results for input(s): DDIMER in the last 72 hours. Hgb A1c No results for input(s): HGBA1C in the last 72 hours. Lipid Profile No results for input(s): CHOL, HDL, LDLCALC, TRIG, CHOLHDL, LDLDIRECT in the last 72 hours. Thyroid function studies No results for input(s): TSH, T4TOTAL, T3FREE, THYROIDAB in the last 72  hours.  Invalid input(s): FREET3 Anemia work up No results for input(s): VITAMINB12, FOLATE, FERRITIN, TIBC, IRON, RETICCTPCT in the last 72 hours. Urinalysis    Component Value Date/Time   COLORURINE YELLOW 01/09/2019 1114   APPEARANCEUR CLEAR 01/09/2019 1114   LABSPEC 1.017 01/09/2019 1114   PHURINE 5.0 01/09/2019 1114   GLUCOSEU NEGATIVE 01/09/2019 1114   [place] 01/09/2019 [place] 01/09/2019 [place] 01/09/2019 [place] 01/09/2019 1114   NITRITE NEGATIVE 01/09/2019 [place] (A) 01/09/2019 1114   Sepsis Labs Invalid input(s): PROCALCITONIN,  WBC,  LACTICIDVEN Microbiology Recent Results (from the past 240 hour(s))  Blood Culture (routine x 2)     Status: None   Collection Time: 01/09/19 11:13 AM  Result Value Ref Range Status   Specimen Description BLOOD LEFT ARM BOTTLES DRAWN AEROBIC AND ANAEROBIC  Final   Special Requests Blood Culture adequate volume  Final   Culture   Final    NO GROWTH 5 DAYS Performed at [facility], [address] [place], [place] [address]    Report Status 01/14/2019 FINAL  Final  Blood Culture (routine x 2)     Status: None   Collection Time: 01/09/19 11:35 AM  Result Value Ref Range Status   Specimen Description   Final    LEFT ANTECUBITAL BOTTLES DRAWN AEROBIC AND ANAEROBIC   Special Requests Blood Culture adequate volume  Final   Culture   Final    NO GROWTH 5 DAYS Performed at [facility], [address], [place], [place] [address]    Report Status 01/14/2019 FINAL  Final  SARS Coronavirus 2 (CEPHEID - Performed in [place] hospital lab), Hosp Order     Status: None   Collection Time: 01/09/19  2:58 PM  Result Value Ref Range Status   SARS Coronavirus 2 NEGATIVE NEGATIVE Final    Comment: (NOTE) If result is NEGATIVE SARS-CoV-2 target nucleic acids are NOT DETECTED. The SARS-CoV-2 RNA is generally detectable in upper and lower  respiratory specimens  during the acute phase of infection. The lowest  concentration of SARS-CoV-2 viral copies this assay can detect is 250  copies / mL. A negative result does not preclude SARS-CoV-2 infection  and should not be used as the sole basis for treatment or other  patient management decisions.  A negative result may occur with  improper specimen collection / handling, submission of specimen other  than nasopharyngeal swab, presence of viral mutation(s) within the  areas targeted by this assay, and inadequate number of viral copies  (<250 copies / mL). A negative result must be combined with clinical  observations, patient  history, and epidemiological information. If result is POSITIVE SARS-CoV-2 target nucleic acids are DETECTED. The SARS-CoV-2 RNA is generally detectable in upper and lower  respiratory specimens dur ing the acute phase of infection.  Positive  results are indicative of active infection with SARS-CoV-2.  Clinical  correlation with patient history and other diagnostic information is  necessary to determine patient infection status.  Positive results do  not rule out bacterial infection or co-infection with other viruses. If result is PRESUMPTIVE POSTIVE SARS-CoV-2 nucleic acids MAY BE PRESENT.   A presumptive positive result was obtained on the submitted specimen  and confirmed on repeat testing.  While 2019 novel coronavirus  (SARS-CoV-2) nucleic acids may be present in the submitted sample  additional confirmatory testing may be necessary for epidemiological  and / or clinical management purposes  to differentiate between  SARS-CoV-2 and other Sarbecovirus currently known to infect humans.  If clinically indicated additional testing with an alternate test  methodology [phone]) is advised. The SARS-CoV-2 RNA is generally  detectable in upper and lower respiratory sp ecimens during the acute  phase of infection. The expected result is Negative. Fact Sheet for Patients:   StrictlyIdeas.no Fact Sheet for Healthcare Providers: BankingDealers.co.za This test is not yet approved or cleared by the [place] FDA and has been authorized for detection and/or diagnosis of SARS-CoV-2 by FDA under an Emergency Use Authorization (EUA).  This EUA will remain in effect (meaning this test can be used) for the duration of the COVID-19 declaration under Section 564(b)(1) of the Act, 21 U.S.C. section 360bbb-3(b)(1), unless the authorization is terminated or revoked sooner. Performed at [facility], [address] [place], [place] [address]   MRSA PCR Screening     Status: Abnormal   Collection Time: 01/09/19  8:47 PM  Result Value Ref Range Status   MRSA by PCR POSITIVE (A) NEGATIVE Final    Comment:        The GeneXpert MRSA Assay (FDA approved for NASAL specimens only), is one component of a comprehensive MRSA colonization surveillance program. It is not intended to diagnose MRSA infection nor to guide or monitor treatment for MRSA infections. RESULT CALLED TO, READ BACK BY AND VERIFIED WITH: TETREAULT,H @ 0007 ON 01/10/19 BY JUW Performed at [facility], [address] [place], [place] [address]   Culture, respiratory     Status: None   Collection Time: 01/09/19 11:39 PM  Result Value Ref Range Status   Specimen Description   Final    TRACHEAL ASPIRATE Performed at [facility], [address], [place], [place] [address]    Special Requests   Final    NONE Performed at [facility], [address] [place], [place] [address]    Gram Stain   Final    MODERATE WBC PRESENT, PREDOMINANTLY PMN RARE GRAM POSITIVE COCCI RARE YEAST Performed at [facility], [place] [address], [place], [place] [address]    Culture   Final    MODERATE METHICILLIN RESISTANT STAPHYLOCOCCUS AUREUS   Report Status 01/12/2019 FINAL  Final   Organism ID, Bacteria METHICILLIN RESISTANT STAPHYLOCOCCUS AUREUS  Final       Susceptibility   Methicillin resistant staphylococcus aureus - MIC*    CIPROFLOXACIN >=8 RESISTANT Resistant     ERYTHROMYCIN >=8 RESISTANT Resistant     GENTAMICIN <=0.5 SENSITIVE Sensitive     OXACILLIN >=4 RESISTANT Resistant     TETRACYCLINE <=1 SENSITIVE Sensitive     VANCOMYCIN 1 SENSITIVE Sensitive  TRIMETH/SULFA <=10 SENSITIVE Sensitive     CLINDAMYCIN <=0.25 SENSITIVE Sensitive     RIFAMPIN <=0.5 SENSITIVE Sensitive     Inducible Clindamycin NEGATIVE Sensitive     * MODERATE METHICILLIN RESISTANT STAPHYLOCOCCUS AUREUS     Time coordinating discharge in minutes: 65  SIGNED:   Debbe Odea, MD  [facility] 01/18/2019, 9:45 AM Pager   If 7PM-7AM, please contact night-coverage [email or web address] Password [email or web address]

## 2019-01-17 NOTE — Care Management Important Message (Signed)
Important Message  Patient Details  Name: John Munoz MRN: [id number] Date of Birth: 10-23-1932   Medicare Important Message Given:  Yes    [name] 01/17/2019, 4:29 PM

## 2019-01-17 NOTE — Evaluation (Signed)
Occupational Therapy Evaluation Patient Details Name: John Munoz MRN: [id number] DOB: [birth year] Today's Date: 01/17/2019    History of Present Illness He was admitted with acute metabolic encephalopathy.  This appears to be related to healthcare associated pneumonia with sepsis.  He briefly required pressor support.  He also had acute heart failure at that time.  He was discovered to have left lower lobe pneumonia.  He had acute hypoxic and hypercapnic respiratory failure requiring intubation and mechanical ventilation.  He was able to be extubated approximately 72 hours ago.   Clinical Impression   Patient in bed upon therapy arrival and agreeable to participate in OT evaluation. Co-treat completed with PT due to patient's medical complexity and limited ability to participate. Patient is overall presented with decrease UB strength and endurance, decreased ROM both active and passively, decreased coordination (fine and gross), and decreased activity tolerance resulting in difficulty completing basic ADL tasks and functional transfers. Pt will benefit from skilled OT services to focus on mentioned deficits in order to increase ability to complete ADL tasks and transfers with less physical assistance need. Recommend SNF at discharge prior to returning home. OT will keep patient on caseload during hospitalization.     Follow Up Recommendations  SNF    Equipment Recommendations  Other (comment)(Defer to next venue)       Precautions / Restrictions Precautions Precautions: Fall Restrictions Weight Bearing Restrictions: No      Mobility Bed Mobility Overal bed mobility: Needs Assistance Bed Mobility: Supine to Sit     Supine to sit: Max assist        Transfers Overall transfer level: Needs assistance Equipment used: None Transfers: Squat Pivot Transfers     Squat pivot transfers: Total assist          Balance Overall balance assessment: Needs assistance Sitting-balance  support: Feet supported Sitting balance-Leahy Scale: Zero Sitting balance - Comments: Pt required physical assist to position body in seated and to gain balance while seated on EOB. After approximately 10 minutes, patient demonstrated a heavy lateral lean to his left. Unable to right himself requiring Total assist from therapist.  Postural control: Left lateral lean        ADL either performed or assessed with clinical judgement   ADL Overall ADL's : Needs assistance/impaired Eating/Feeding: Total assistance;Sitting Eating/Feeding Details (indicate cue type and reason): Patient unable to feed himself. Grooming: Wash/dry face;Oral care;Brushing hair;Total assistance;Sitting   Upper Body Bathing: Total assistance;Bed level   Lower Body Bathing: Total assistance;Bed level       Lower Body Dressing: Total assistance;Bed level   Toilet Transfer: Total assistance;Squat-pivot Toilet Transfer Details (indicate cue type and reason): simulated from bed to recliner with PT performing.  Toileting- Clothing Manipulation and Hygiene: Total assistance;Bed level       Functional mobility during ADLs: Total assistance;+2 for safety/equipment;+2 for physical assistance       Vision Baseline Vision/History: (unknown) Patient Visual Report: (unknown)              Pertinent Vitals/Pain Pain Assessment: No/denies pain     Hand Dominance Right   Extremity/Trunk Assessment Upper Extremity Assessment Upper Extremity Assessment: Generalized weakness(patient with increased joint stiffness making him sit in a recliner shape. Unable to hold neck into neutral or extension. Remains flexed to chest) Decreased fine and gross motor coordination of BUE. Patient able to complete 25% range of shoulder flexion seated; BUE. Unable to achieve more than 50% shoulder flexion passively while seated on EOB. Joint stiffness  and decreased mobility noted in all joints (shoulder, elbow, wrist, and hand). Unable to  complete any basic self care task without extensive physical assistance.    Lower Extremity Assessment Lower Extremity Assessment: Defer to PT evaluation   Cervical / Trunk Assessment Cervical / Trunk Assessment: Kyphotic   Communication Communication Communication: Expressive difficulties(soft spoken, Difficult to hear.)   Cognition Arousal/Alertness: Awake/alert Behavior During Therapy: WFL for tasks assessed/performed Overall Cognitive Status: No family/caregiver present to determine baseline cognitive functioning     General Comments  Patient with edema noted in bilateral hands. Poor skin integrity noted overall.             Home Living Family/patient expects to be discharged to:: Skilled nursing facility Living Arrangements: Other relatives(Niece) Available Help at Discharge: Family Type of Home: House Home Access: Stairs to enter [employer] of Steps: 3-4 Entrance Stairs-Rails: Right Home Layout: One level           Bathroom Accessibility: Yes   Home Equipment: Walker - 2 wheels;Wheelchair - manual;Cane - single point   Additional Comments: Patient is a poor historian. Information gathered from patient's chart      Prior Functioning/Environment Level of Independence: Needs assistance  Gait / Transfers Assistance Needed: household ambulator with RW ADL's / Homemaking Assistance Needed: Niece provided assistance with basic ADL tasks.            OT Problem List: Decreased strength;Impaired vision/perception;Decreased knowledge of use of DME or AE;Impaired tone;Increased edema;Decreased knowledge of precautions;Decreased coordination;Decreased range of motion;Decreased activity tolerance;Impaired UE functional use;Decreased safety awareness;Impaired balance (sitting and/or standing)      OT Treatment/Interventions: Self-care/ADL training;Modalities;Balance training;Therapeutic exercise;Neuromuscular education;Therapeutic activities;Energy  conservation;Cognitive remediation/compensation;DME and/or AE instruction;Visual/perceptual remediation/compensation;Manual therapy;Patient/family education    OT Goals(Current goals can be found in the care plan section) Acute Rehab OT Goals Patient Stated Goal: return home after rehab OT Goal Formulation: Patient unable to participate in goal setting Time For Goal Achievement: 01/31/19 Potential to Achieve Goals: Fair  OT Frequency: Min 3X/week   Barriers to D/C: Decreased caregiver support  Pt is requiring total assistance for basic ADL tasks at this time.        Co-evaluation PT/OT/SLP Co-Evaluation/Treatment: Yes Reason for Co-Treatment: Complexity of the patient's impairments (multi-system involvement);For patient/therapist safety;To address functional/ADL transfers   OT goals addressed during session: ADL's and self-care;Strengthening/ROM;Proper use of Adaptive equipment and DME      AM-PAC OT "6 Clicks" Daily Activity     Outcome Measure Help from another person eating meals?: Total Help from another person taking care of personal grooming?: Total Help from another person toileting, which includes using toliet, bedpan, or urinal?: Total Help from another person bathing (including washing, rinsing, drying)?: Total Help from another person to put on and taking off regular upper body clothing?: Total Help from another person to put on and taking off regular lower body clothing?: Total 6 Click Score: 6   End of Session Equipment Utilized During Treatment: Gait belt Nurse Communication: Mobility status  Activity Tolerance: Patient limited by fatigue;Patient tolerated treatment well Patient left: in chair;with call bell/phone within reach  OT Visit Diagnosis: Muscle weakness (generalized) (M62.81);Repeated falls (R29.6)                Time: 8127-5170 OT Time Calculation (min): 40 min Charges:  OT General Charges $OT Visit: 1 Visit OT Evaluation $OT Eval High Complexity:  1 High OT Treatments $Self Care/Home Management : 23-37 mins  Ailene Ravel, OTR/L,CBIS  [phone]   Tarrin Menn,  Clarene Duke 01/17/2019, 9:22 AM

## 2019-01-17 NOTE — Clinical Social Work Note (Addendum)
This patient will need signed 30 day note by Dr is he ends up going to SNF.  PASSR states he has history of alcoholism and MDD.

## 2019-01-17 NOTE — TOC Progression Note (Signed)
Transition of Care [facility]) - Progression Note    Patient Details  Name: John Munoz MRN: [id number] Date of Birth: 01-Jan-1933  Transition of Care [facility]) CM/SW [place], [place] Phone Number: 01/17/2019, 11:18 AM  Clinical Narrative:   Niece is concerned about patient going to SNF due to increased risk of COVID 19 contraction.  States she will come in today to work with patient and staff to see what he is capable of so she knows if it is realistic for her to take him home.  Plans to be her between 2 and 2:30PM    Expected Discharge Plan: [place] Services Barriers to Discharge: Other (comment)(niece to come in today to assess ability to care for pt in the home)  Expected Discharge Plan and Services Expected Discharge Plan: [place] In-house Referral: Clinical Social Work Discharge Planning Services: CM Consult   Living arrangements for the past 2 months: Single Family Home                                       Social Determinants of Health (SDOH) Interventions    Readmission Risk Interventions No flowsheet data found.

## 2019-01-18 MED ORDER — HYDROCODONE-ACETAMINOPHEN 5-325 MG PO TABS
1.0000 | ORAL_TABLET | Freq: Once | ORAL | Status: AC
  Administered 2019-01-18: 1 via ORAL
  Filled 2019-01-18: qty 1

## 2019-01-18 MED ORDER — TRAMADOL HCL 50 MG PO TABS: 50.0000 mg | ORAL_TABLET | Freq: Once | ORAL | Status: DC

## 2019-01-18 NOTE — TOC Progression Note (Signed)
Transition of Care [facility]) - Progression Note    Patient Details  Name: John Munoz MRN: [id number] Date of Birth: 13-Jun-1933  Transition of Care [facility]) CM/SW [place], [place] Phone Number: 01/18/2019, 1:03 PM  Clinical Narrative:   Level II PASSR was triggered.  I called to find out if PASSR is waived in these situations, and it is not.  Must wait for screening to be complete prior to d/c of patient.  They could not tell me when patient would be reviewed.  I alerted Dr and updated [facility], where pt has been accepted.  Will also need second COVID swab prior to d/c as [facility] Ed[facility] requires one within 24 hours of d/c.    Expected Discharge Plan: [place] Barriers to Discharge: SNF Pending bed offer  Expected Discharge Plan and Services Expected Discharge Plan: [place] In-house Referral: Clinical Social Work Discharge Planning Services: CM Consult   Living arrangements for the past 2 months: Single Family Home Expected Discharge Date: 01/17/19                                     Social Determinants of Health (SDOH) Interventions    Readmission Risk Interventions No flowsheet data found.

## 2019-01-18 NOTE — TOC Progression Note (Signed)
Transition of Care [facility]) - Progression Note    Patient Details  Name: John Munoz MRN: [id number] Date of Birth: 09-Oct-1932  Transition of Care [facility]) CM/SW [place], [place] Phone Number: 01/18/2019, 9:46 AM  Clinical Narrative:   Niece came to visit last night.  Agrees he needs rehab.  Asked for referral to [facility] facilities.  Aaron Edelman has offered bed with COVID test in last 24 hours; [facility] is reviewing and will let us know this AM.  [facility] is first choice.  Have sent in Level II information to PASSR-waiting for number.    Expected Discharge Plan: [place] Barriers to Discharge: SNF Pending bed offer  Expected Discharge Plan and Services Expected Discharge Plan: [place] In-house Referral: Clinical Social Work Discharge Planning Services: CM Consult   Living arrangements for the past 2 months: Single Family Home Expected Discharge Date: 01/17/19                                     Social Determinants of Health (SDOH) Interventions    Readmission Risk Interventions No flowsheet data found.

## 2019-01-18 NOTE — Progress Notes (Signed)
[facility]  I have evaluated John Munoz today. He is remains stable and is ready for d/c. I did the d/c summary on 5/18 and he has been awaiting a SNF bed.   Debbe Odea, MD

## 2019-01-18 NOTE — Progress Notes (Signed)
This RN spoke with CSW and MD regarding the need for a negative COVID result prior to admission to SNF. Pt to be reswabbed for COVID once PASSR number is received by CSW.   Celestia Khat, RN

## 2019-01-18 NOTE — NC FL2 (Signed)
[place] LEVEL OF CARE SCREENING TOOL     IDENTIFICATION  Patient Name: John Munoz Birthdate: [birth year] Sex: male Admission Date (Current Location): 01/09/2019  [facility] and [place] Number:  [employer] and Address:  [facility] [address] [place]      Provider Number: [id number]  Attending Physician Name and Address:  Debbe Odea, MD  Relative Name and Phone Number:  Vevelyn Francois care [phone]    Current Level of Care: Hospital Recommended Level of Care: [place] Prior Approval Number:    Date Approved/Denied:   PASRR Number:    Discharge Plan: SNF    Current Diagnoses: Patient Active Problem List   Diagnosis Date Noted  . Pancytopenia ([place])   . Pneumonia of left lower lobe due to infectious organism ([place])   . Hypotension 01/10/2019  . Abnormal liver function 01/10/2019  . Pressure injury of skin 01/10/2019  . Sepsis due to undetermined organism ([place]) 01/10/2019  . Acute respiratory failure with hypoxia ([place]) 01/10/2019  . Thrombocytopenia ([place]) 01/10/2019  . Acute CHF (congestive heart failure) ([place]) 01/10/2019  . Acute metabolic encephalopathy 62/70/3500  . Altered mental status   . Goals of care, counseling/discussion   . Palliative care encounter   . HCAP (healthcare-associated pneumonia) 01/09/2019  . Sepsis with acute hypoxic respiratory failure ([place]) 01/09/2019    Orientation RESPIRATION BLADDER Height & Weight     Self, Place  Normal Continent Weight: 72.1 kg Height:  5\' 8"  (172.7 cm)  BEHAVIORAL SYMPTOMS/MOOD NEUROLOGICAL BOWEL NUTRITION STATUS  (none) (none) Continent (DYS 1, HH low sodium)  AMBULATORY STATUS COMMUNICATION OF NEEDS Skin   Extensive Assist Verbally PU Stage and Appropriate Care, Other (Comment)(Unstageable, full thickness tissue loss of R heel,  non-pressure wound of both r andl buttocks)                       Personal Care Assistance  Level of Assistance  Bathing, Feeding, Dressing Bathing Assistance: Maximum assistance Feeding assistance: Limited assistance Dressing Assistance: Maximum assistance     Functional Limitations Info  Sight, Hearing, Speech Sight Info: Adequate Hearing Info: Adequate Speech Info: Adequate    SPECIAL CARE FACTORS FREQUENCY  PT (By licensed PT)     PT Frequency: 5X/W              Contractures Contractures Info: Not present    Additional Factors Info  Code Status, Allergies Code Status Info: DNR Allergies Info: NKA           Current Medications (01/18/2019):  This is the current hospital active medication list Current Facility-Administered Medications  Medication Dose Route Frequency Provider Last Rate Last Dose  . 0.9 %  sodium chloride infusion   Intravenous PRN Orson Eva, MD   Stopped at 01/12/19 1717  . acetaminophen (TYLENOL) tablet 650 mg  650 mg Oral Q6H PRN Orson Eva, MD   650 mg at 01/17/19 2056  . albuterol (PROVENTIL) (2.5 MG/3ML) 0.083% nebulizer solution 2.5 mg  2.5 mg Nebulization Q4H Emokpae, Ejiroghene E, MD   2.5 mg at 01/18/19 0301  . Chlorhexidine Gluconate Cloth 2 % PADS 6 each  6 each Topical Q0600 Emokpae, Ejiroghene E, MD   6 each at 01/18/19 0546  . collagenase (SANTYL) ointment   Topical Daily Tat, David, MD      . hydrALAZINE (APRESOLINE) injection 10 mg  10 mg Intravenous Q6H PRN Orson Eva, MD      .  ipratropium-albuterol (DUONEB) 0.5-2.5 (3) MG/3ML nebulizer solution 3 mL  3 mL Nebulization Q4H PRN Emokpae, Ejiroghene E, MD      . ondansetron (ZOFRAN) injection 4 mg  4 mg Intravenous Q6H PRN Tat, David, MD      . sodium chloride flush (NS) 0.9 % injection 10-40 mL  10-40 mL Intracatheter Q12H Aviva Signs, MD   10 mL at 01/17/19 2101  . sodium chloride flush (NS) 0.9 % injection 10-40 mL  10-40 mL Intracatheter PRN Aviva Signs, MD   10 mL at 01/10/19 0938  . sulfamethoxazole-trimethoprim (BACTRIM DS) 800-160 MG per tablet 1 tablet  1 tablet  Oral Q12H Debbe Odea, MD   1 tablet at 01/17/19 2100     Discharge Medications: Please see discharge summary for a list of discharge medications.  Relevant Imaging Results:  Relevant Lab Results:   Additional Information [place], [place]

## 2019-01-18 NOTE — Progress Notes (Signed)
  Speech Language Pathology Treatment: Dysphagia  Patient Details Name: John Munoz MRN: [id number] DOB: 1933-04-21 Today's Date: 01/18/2019 Time: 8264-1583 SLP Time Calculation (min) (ACUTE ONLY): 15 min  Assessment / Plan / Recommendation Clinical Impression  Pt seen in room for ongoing dysphagia intervention. Pt seated upright in bed and continues to lean to the left. Pt reportedly tolerated breakfast meal with nursing assist for feeding, however intake limited. Pt agreeable to drinking, but declined solids trials. Pt without overt signs or symptoms of aspiration during straw sips of coffee and apple juice. Pt able to produce cued cough again this date (congested) and was encouraged to swallow two times for each sip. Pt continues to be at risk for aspiration due to compromised respiratory status, poor endurance, and dependent feeder status. Continue diet as ordered and recommend f/u at SNF for upgrades as appropriate.    HPI HPI: John Munoz is a 85 y.o.male from home with great neicewith medical history significant forCOPD, depression, BPH, recent right heel pressure ulcer with cellulitis and necrosis, recent UTI, brought to the ED with reports of altered mental status, lethargic of 2 days duration.  In ED, temp 91.1, tachypnea, CT chest> left > right lower lobe infiltrates. Started on  Vanc and Cefepime for HCAP and intubated in ED. COVID neg. MRSA screen +. Admitted for HCAP, acute diastolic CHF, acute encephalopathy. Made DNR the following day. Venous duplex 01/10/19- no DVT. Pt was vomiting tube feeds while intubated, he was successfully extubated 01/14/2019. Chest x-ray shows: Stable ventilation with continued dense left lower lobe collapse or consolidation and patchy right lung base opacity. Suspect pulmonary vascular congestion superimposed on chronic interstitial lung disease. Palliative team following. BSE requested.       SLP Plan  Continue with current plan of care        Recommendations  Diet recommendations: Dysphagia 1 (puree);Thin liquid Liquids provided via: Cup;Straw Medication Administration: Crushed with puree Supervision: Full supervision/cueing for compensatory strategies;Staff to assist with self feeding Compensations: Minimize environmental distractions;Slow rate;Small sips/bites;Lingual sweep for clearance of pocketing Postural Changes and/or Swallow Maneuvers: Seated upright 90 degrees;Upright 30-60 min after meal                Oral Care Recommendations: Oral care BID;Staff/trained caregiver to provide oral care Follow up Recommendations: Skilled Nursing facility SLP Visit Diagnosis: Dysphagia, unspecified (R13.10) Plan: Continue with current plan of care       Thank you,  Genene Churn, [place]                 [place] 01/18/2019, 10:07 AM

## 2019-01-19 DIAGNOSIS — J181 Lobar pneumonia, unspecified organism: Secondary | ICD-10-CM

## 2019-01-19 DIAGNOSIS — I5031 Acute diastolic (congestive) heart failure: Secondary | ICD-10-CM

## 2019-01-19 LAB — COMPREHENSIVE METABOLIC PANEL
ALT: 32 U/L (ref 0–44)
AST: 39 U/L (ref 15–41)
Albumin: 2.8 g/dL — ABNORMAL LOW (ref 3.5–5.0)
Alkaline Phosphatase: 96 U/L (ref 38–126)
Anion gap: 8 (ref 5–15)
BUN: 11 mg/dL (ref 8–23)
CO2: 24 mmol/L (ref 22–32)
Calcium: 8.4 mg/dL — ABNORMAL LOW (ref 8.9–10.3)
Chloride: 103 mmol/L (ref 98–111)
Creatinine, Ser: 0.59 mg/dL — ABNORMAL LOW (ref 0.61–1.24)
GFR calc Af Amer: >60 mL/min (ref >60)
GFR calc non Af Amer: >60 mL/min (ref >60)
Glucose, Bld: 91 mg/dL (ref 70–99)
Potassium: 3.3 mmol/L — ABNORMAL LOW (ref 3.5–5.1)
Sodium: 135 mmol/L (ref 135–145)
Total Bilirubin: 0.4 mg/dL (ref 0.3–1.2)
Total Protein: 5.6 g/dL — ABNORMAL LOW (ref 6.5–8.1)

## 2019-01-19 LAB — CBC
HCT: 28.2 % — ABNORMAL LOW (ref 39.0–52.0)
Hemoglobin: 9 g/dL — ABNORMAL LOW (ref 13.0–17.0)
MCH: 32.3 pg (ref 26.0–34.0)
MCHC: 31.9 g/dL (ref 30.0–36.0)
MCV: 101.1 fL — ABNORMAL HIGH (ref 80.0–100.0)
Platelets: 248 10*3/uL (ref 150–400)
RBC: 2.79 MIL/uL — ABNORMAL LOW (ref 4.22–5.81)
RDW: 16.6 % — ABNORMAL HIGH (ref 11.5–15.5)
WBC: 3.8 10*3/uL — ABNORMAL LOW (ref 4.0–10.5)
nRBC: 0 % (ref 0.0–0.2)

## 2019-01-19 LAB — CREATININE, SERUM
Creatinine, Ser: 0.62 mg/dL (ref 0.61–1.24)
GFR calc Af Amer: >60 mL/min (ref >60)
GFR calc non Af Amer: >60 mL/min (ref >60)

## 2019-01-19 LAB — SARS CORONAVIRUS 2 BY RT PCR (HOSPITAL ORDER, PERFORMED IN ~~LOC~~ HOSPITAL LAB): SARS Coronavirus 2: NEGATIVE

## 2019-01-19 LAB — MAGNESIUM: Magnesium: 1.5 mg/dL — ABNORMAL LOW (ref 1.7–2.4)

## 2019-01-19 MED ORDER — ALBUTEROL SULFATE (2.5 MG/3ML) 0.083% IN NEBU
2.5000 mg | INHALATION_SOLUTION | Freq: Four times a day (QID) | RESPIRATORY_TRACT | Status: DC
  Administered 2019-01-19 (×2): 2.5 mg via RESPIRATORY_TRACT
  Filled 2019-01-19 (×2): qty 3

## 2019-01-19 MED ORDER — ALBUTEROL SULFATE (2.5 MG/3ML) 0.083% IN NEBU
2.5000 mg | INHALATION_SOLUTION | Freq: Four times a day (QID) | RESPIRATORY_TRACT | Status: DC
  Administered 2019-01-20: 2.5 mg via RESPIRATORY_TRACT
  Filled 2019-01-19: qty 3

## 2019-01-19 MED ORDER — MAGNESIUM SULFATE 2 GM/50ML IV SOLN
2.0000 g | Freq: Once | INTRAVENOUS | Status: AC
  Administered 2019-01-19: 2 g via INTRAVENOUS
  Filled 2019-01-19: qty 50

## 2019-01-19 MED ORDER — HYDROCODONE-ACETAMINOPHEN 5-325 MG PO TABS
1.0000 | ORAL_TABLET | ORAL | Status: AC | PRN
  Administered 2019-01-19 (×2): 1 via ORAL
  Filled 2019-01-19 (×2): qty 1

## 2019-01-19 MED ORDER — POTASSIUM CHLORIDE CRYS ER 20 MEQ PO TBCR
40.0000 meq | EXTENDED_RELEASE_TABLET | Freq: Once | ORAL | Status: AC
  Administered 2019-01-19: 40 meq via ORAL
  Filled 2019-01-19: qty 2

## 2019-01-19 NOTE — Care Management Important Message (Signed)
Important Message  Patient Details  Name: John Munoz MRN: [id number] Date of Birth: 1933/03/28   Medicare Important Message Given:  Yes    [name] 01/19/2019, 11:20 AM

## 2019-01-19 NOTE — Care Management (Addendum)
Verified Level II PASARR has been waived. MD and RN updated. Discussed with [facility]   Patient will need another negative covid test prior to DC to facility. Test has been ordered.   ADDENDUM: Facility able to take patient 01/20/19.

## 2019-01-19 NOTE — Progress Notes (Signed)
PROGRESS NOTE  John Munoz QQP:[id number] DOB: 04/15/1933 DOA: 01/09/2019 PCP: Neale Burly, MD  Brief History:  85 year old male with a history of COPD, BPH, GERD, and anxiety presenting with altered mental status. The patient is currently on a ventilator and is unable to provide any history. Attempt to contact family was unsuccessful. Apparently, the patient was noted to have somnolence and altered mental status by home health aide 2 days prior to this admission. At baseline, the patient is alert and oriented and usually ambulates with a walker. Apparently, the patient had a recent admission to [facility] 12/01/2018 through 12/06/2018 where he was treated for right heel ulcer and infection. In addition, the patient also recently finished antibiotics for presumptive UTI. He was subsequently discharged home. In the emergency department, the patient was noted to be somnolent minimally responsive to pain. He was initially placed on BiPAP, but was subsequently intubated secondary to his inability to protect his airway. The patient was initially hypothermic with a temperature 91.1 F and hypotensive.  Assessment/Plan: Sepsis -Present at time of admission -Secondary toHCAP -Check procalcitonin <0.10 -Continue vancomycin and cefepime pending culture data -Lactic acid peaked 0.9 -GI prophylaxis -Weaned off levophed evening 01/10/19 -5/11--started solucortef with improved CBGs (was hypoglycemic) and weaned off levophed--wean solucortef -has been weaned off steroids -sepsis physiology resolved  Acute respiratory failure with hypoxia -Secondary to pneumonia -Pulmonary consult appreciated -Wean to extubation as tolerated -personally reviewed cXR--increased interstital markings, LL opacity  Lobar pneumonia/HCAP--MRSA pneumonia -01/09/2019 CT chest--LLLinfiltrate with air bronchograms and left-sided effusion -Continued vancomycin  -d/c cefepime  -tracheal aspirate S.  aureus -MRSA screen positive -COVID-19--NEG -finished 10 days of abx during hospitalization  Elevated troponin -likely due to demand ischemia in the setting of sepsis -01/11/19 echo--EF 55- 60%, no WMA, mild to moderate MR -personally reviewed EKG--sinus, nonspecific T wave changes -overall trend flat  FEN -hypokalemia--replete -hypomagnesemia--replete -start enteral feeding  Lower extremity edema -Venous duplex rule out DVT--neg  Acute diastolic CHF -BNP 7124 -Echocardiogram--EF 55-60%, no WMA, mild to mod MR -fluid resuscitated initially -CVP 10-12 -now clinically euvolemic  Transaminasemia -Secondary to sepsis -Trend-->resolved -hep B/C neg  Right heel decubitus ulcer--unstageable -Present at the time of admission -Does not appear to be clinically infected on examination -Wound care consult appreciated--Enzymatic debridement and saline moist gauze. Cover with dry dressing. Change daily.   Thrombocytopenia -due to sepsis -P80---9983 -RBC folic JASN--0539 -check coags--elevated PTT -fibrinogen--435  -resolved  Anxiety/depression -restart BuSpar and Celexa when able to tolerate oral intake        Disposition Plan:   SNF 5/21 if stable Family Communication:  No Family at bedside  Consultants:  pulmonary  Code Status:  DNR  DVT Prophylaxis:  [place] Heparin / [place] Lovenox   Procedures: As Listed in Progress Note Above     Subjective: Patient denies fevers, chills, headache, chest pain, dyspnea, nausea, vomiting, diarrhea, abdominal pain, dysuria, hematuria, hematochezia, and melena.   Objective: Vitals:   01/19/19 1200 01/19/19 1211 01/19/19 1438 01/19/19 1617  BP: 138/70     Pulse: 81     Resp: (!) 23     Temp:  98.1 F (36.7 C)  98.2 F (36.8 C)  TempSrc:      SpO2: 96%  98%   Weight:      Height:        Intake/Output Summary (Last 24 hours) at 01/19/2019 1748 Last data filed at 01/19/2019 1740 Gross per 24 hour  Intake 960  ml  Output 1800 ml  Net -840 ml   Weight change: -1.3 kg Exam:   General:  Pt is alert, follows commands appropriately, not in acute distress  HEENT: No icterus, No thrush, No neck mass, [place]/AT  Cardiovascular: RRR, S1/S2, no rubs, no gallops  Respiratory: bibasilar rales. No wheeze  Abdomen: Soft/+BS, non tender, non distended, no guarding  Extremities: No edema, No lymphangitis, No petechiae, No rashes, no synovitis   Data Reviewed: I have personally reviewed following labs and imaging studies Basic Metabolic Panel: Recent Labs  Lab 01/13/19 0458 01/13/19 0859  01/14/19 0812 01/14/19 1130 01/15/19 0448 01/16/19 0416 01/17/19 0354 01/19/19 0422 01/19/19 1241  NA  --  138   < > 138  --  139 137 137  --  135  K  --  2.7*   < > 3.4*  --  3.0* 3.2* 3.5  --  3.3*  CL  --  97*   < > 100  --  103 103 101  --  103  CO2  --  29   < > 30  --  28 26 27   --  24  GLUCOSE  --  120*   < > 132*  --  81 77 99  --  91  BUN  --  17   < > 23  --  21 16 14   --  11  CREATININE  --  0.60*   < > 0.51*  --  0.48* 0.42* 0.43* 0.62 0.59*  CALCIUM  --  8.5*   < > 8.3*  --  8.2* 8.1* 8.4*  --  8.4*  MG 1.6* 1.6*  --   --  1.9 1.6* 1.9  --   --  1.5*  PHOS 3.2 3.4  --   --   --   --   --   --   --   --    < > = values in this interval not displayed.   Liver Function Tests: Recent Labs  Lab 01/19/19 1241  AST 39  ALT 32  ALKPHOS 96  BILITOT 0.4  PROT 5.6*  ALBUMIN 2.8*   No results for input(s): LIPASE, AMYLASE in the last 168 hours. No results for input(s): AMMONIA in the last 168 hours. Coagulation Profile: No results for input(s): INR, PROTIME in the last 168 hours. CBC: Recent Labs  Lab 01/13/19 0458 01/14/19 0424 01/14/19 0812 01/19/19 1241  WBC 7.1 5.2 5.0 3.8*  HGB 10.3* 9.6* 9.1* 9.0*  HCT 30.2* 28.6* 28.0* 28.2*  MCV 93.2 95.3 96.9 101.1*  PLT 66* 71* 72* 248   Cardiac Enzymes: No results for input(s): CKTOTAL, CKMB, CKMBINDEX, TROPONINI in the last 168  hours. BNP: Invalid input(s): POCBNP CBG: Recent Labs  Lab 01/13/19 2110 01/13/19 2352 01/14/19 0431 01/14/19 0744 01/14/19 1107  GLUCAP 132* 129* 132* 132* 116*   HbA1C: No results for input(s): HGBA1C in the last 72 hours. Urine analysis:    Component Value Date/Time   COLORURINE YELLOW 01/09/2019 1114   APPEARANCEUR CLEAR 01/09/2019 1114   LABSPEC 1.017 01/09/2019 1114   PHURINE 5.0 01/09/2019 1114   GLUCOSEU NEGATIVE 01/09/2019 1114   [place] 01/09/2019 1114   [place] 01/09/2019 [place] 01/09/2019 [place] 01/09/2019 1114   NITRITE NEGATIVE 01/09/2019 1114   LEUKOCYTESUR TRACE (A) 01/09/2019 1114   Sepsis Labs: @LABRCNTIP (procalcitonin:4,lacticidven:4) ) Recent Results (from the past 240 hour(s))  MRSA PCR Screening  Status: Abnormal   Collection Time: 01/09/19  8:47 PM  Result Value Ref Range Status   MRSA by PCR POSITIVE (A) NEGATIVE Final    Comment:        The GeneXpert MRSA Assay (FDA approved for NASAL specimens only), is one component of a comprehensive MRSA colonization surveillance program. It is not intended to diagnose MRSA infection nor to guide or monitor treatment for MRSA infections. RESULT CALLED TO, READ BACK BY AND VERIFIED WITH: TETREAULT,H @ 0007 ON 01/10/19 BY JUW Performed at [facility], [address], [place], [place] [address]   Culture, respiratory     Status: None   Collection Time: 01/09/19 11:39 PM  Result Value Ref Range Status   Specimen Description   Final    TRACHEAL ASPIRATE Performed at [facility], [address], [place], [place] [address]    Special Requests   Final    NONE Performed at [facility], [address] [place], [place] [address]    Gram Stain   Final    MODERATE WBC PRESENT, PREDOMINANTLY PMN RARE GRAM POSITIVE COCCI RARE YEAST Performed at [facility], [place] [address], [place], [place] [address]    Culture   Final    MODERATE  METHICILLIN RESISTANT STAPHYLOCOCCUS AUREUS   Report Status 01/12/2019 FINAL  Final   Organism ID, Bacteria METHICILLIN RESISTANT STAPHYLOCOCCUS AUREUS  Final      Susceptibility   Methicillin resistant staphylococcus aureus - MIC*    CIPROFLOXACIN >=8 RESISTANT Resistant     ERYTHROMYCIN >=8 RESISTANT Resistant     GENTAMICIN <=0.5 SENSITIVE Sensitive     OXACILLIN >=4 RESISTANT Resistant     TETRACYCLINE <=1 SENSITIVE Sensitive     VANCOMYCIN 1 SENSITIVE Sensitive     TRIMETH/SULFA <=10 SENSITIVE Sensitive     CLINDAMYCIN <=0.25 SENSITIVE Sensitive     RIFAMPIN <=0.5 SENSITIVE Sensitive     Inducible Clindamycin NEGATIVE Sensitive     * MODERATE METHICILLIN RESISTANT STAPHYLOCOCCUS AUREUS  SARS Coronavirus 2 (CEPHEID - Performed in [place] hospital lab), Hosp Order     Status: None   Collection Time: 01/19/19 11:23 AM  Result Value Ref Range Status   SARS Coronavirus 2 NEGATIVE NEGATIVE Final    Comment: (NOTE) If result is NEGATIVE SARS-CoV-2 target nucleic acids are NOT DETECTED. The SARS-CoV-2 RNA is generally detectable in upper and lower  respiratory specimens during the acute phase of infection. The lowest  concentration of SARS-CoV-2 viral copies this assay can detect is 250  copies / mL. A negative result does not preclude SARS-CoV-2 infection  and should not be used as the sole basis for treatment or other  patient management decisions.  A negative result may occur with  improper specimen collection / handling, submission of specimen other  than nasopharyngeal swab, presence of viral mutation(s) within the  areas targeted by this assay, and inadequate number of viral copies  (<250 copies / mL). A negative result must be combined with clinical  observations, patient history, and epidemiological information. If result is POSITIVE SARS-CoV-2 target nucleic acids are DETECTED. The SARS-CoV-2 RNA is generally detectable in upper and lower  respiratory specimens dur ing  the acute phase of infection.  Positive  results are indicative of active infection with SARS-CoV-2.  Clinical  correlation with patient history and other diagnostic information is  necessary to determine patient infection status.  Positive results do  not rule out bacterial infection or co-infection with other viruses. If result is  PRESUMPTIVE POSTIVE SARS-CoV-2 nucleic acids MAY BE PRESENT.   A presumptive positive result was obtained on the submitted specimen  and confirmed on repeat testing.  While 2019 novel coronavirus  (SARS-CoV-2) nucleic acids may be present in the submitted sample  additional confirmatory testing may be necessary for epidemiological  and / or clinical management purposes  to differentiate between  SARS-CoV-2 and other Sarbecovirus currently known to infect humans.  If clinically indicated additional testing with an alternate test  methodology [phone]) is advised. The SARS-CoV-2 RNA is generally  detectable in upper and lower respiratory sp ecimens during the acute  phase of infection. The expected result is Negative. Fact Sheet for Patients:  StrictlyIdeas.no Fact Sheet for Healthcare Providers: BankingDealers.co.za This test is not yet approved or cleared by the [place] FDA and has been authorized for detection and/or diagnosis of SARS-CoV-2 by FDA under an Emergency Use Authorization (EUA).  This EUA will remain in effect (meaning this test can be used) for the duration of the COVID-19 declaration under Section 564(b)(1) of the Act, 21 U.S.C. section 360bbb-3(b)(1), unless the authorization is terminated or revoked sooner. Performed at [facility], [address], [place], [place] [address]      Scheduled Meds:  albuterol  2.5 mg Nebulization Q6H   Chlorhexidine Gluconate Cloth  6 each Topical Q0600   sodium chloride flush  10-40 mL Intracatheter Q12H   sulfamethoxazole-trimethoprim  1 tablet  Oral Q12H   Continuous Infusions:  sodium chloride Stopped (01/12/19 1717)    Procedures/Studies: Ct Head Wo Contrast  Result Date: 01/09/2019 CLINICAL DATA:  85 year old male with altered mental status. EXAM: CT HEAD WITHOUT CONTRAST TECHNIQUE: Contiguous axial images were obtained from the base of the skull through the vertex without intravenous contrast. COMPARISON:  02/28/2017 CT and prior studies FINDINGS: Brain: No evidence of acute infarction, hemorrhage, hydrocephalus, extra-axial collection or mass lesion/mass effect. Atrophy and chronic small-vessel white matter ischemic changes again noted. Vascular: Atherosclerotic calcifications again noted Skull: Normal. Negative for fracture or focal lesion. Sinuses/Orbits: No acute finding. Other: None. IMPRESSION: 1. No evidence of acute intracranial abnormality. 2. Atrophy and chronic small-vessel white matter ischemic changes. Electronically Signed   By: Margarette Canada M.D.   On: 01/09/2019 15:20   Ct Chest W Contrast  Result Date: 01/09/2019 CLINICAL DATA:  Cough and pleural effusion EXAM: CT CHEST WITH CONTRAST TECHNIQUE: Multidetector CT imaging of the chest was performed during intravenous contrast administration. CONTRAST:  57mL OMNIPAQUE 300 COMPARISON:  Plain film from earlier in the same day. FINDINGS: Cardiovascular: Thoracic aorta and its branches demonstrate atherosclerotic calcifications without aneurysmal dilatation or dissection. Diffuse coronary calcifications are seen. The heart is at the upper limits of normal in size. Pulmonary artery is well visualized. No large central pulmonary embolus is seen. Timing was not performed for embolus evaluation. Mediastinum/Nodes: The esophagus as visualized is within normal limits. Thoracic inlet is within normal limits. Scattered small mediastinal lymph nodes are seen likely reactive in nature. Lungs/Pleura: Mild right lower lobe atelectatic changes are noted with small right pleural effusion. A  larger left-sided pleural effusion is noted with associated lower lobe infiltrate with air bronchograms consistent with pneumonia. These changes are similar to that seen on recent plain film. No parenchymal nodules are seen. Upper Abdomen: Prior cholecystectomy is noted. No acute abnormality is noted within the abdomen. Musculoskeletal: Degenerative changes of the thoracic spine are noted. IMPRESSION: Bilateral pleural effusions left greater than right. Left lower lobe infiltrate consistent with acute pneumonia. Mild right  basilar atelectasis is seen. Aortic Atherosclerosis (ICD10-I70.0). Electronically Signed   By: Inez Catalina M.D.   On: 01/09/2019 15:17   US Venous Img Lower Bilateral  Result Date: 01/10/2019 CLINICAL DATA:  Bilateral lower extremity pain and edema. Evaluate for DVT. EXAM: BILATERAL LOWER EXTREMITY VENOUS DOPPLER ULTRASOUND TECHNIQUE: Gray-scale sonography with graded compression, as well as color Doppler and duplex ultrasound were performed to evaluate the lower extremity deep venous systems from the level of the common femoral vein and including the common femoral, femoral, profunda femoral, popliteal and calf veins including the posterior tibial, peroneal and gastrocnemius veins when visible. The superficial great saphenous vein was also interrogated. Spectral Doppler was utilized to evaluate flow at rest and with distal augmentation maneuvers in the common femoral, femoral and popliteal veins. COMPARISON:  None. FINDINGS: RIGHT LOWER EXTREMITY The right common femoral vein, saphenofemoral junction and deep femoral vein were unable to be visualized secondary to central venous catheter bandages. Femoral Vein: No evidence of thrombus. Normal compressibility, respiratory phasicity and response to augmentation. Popliteal Vein: No evidence of thrombus. Normal compressibility, respiratory phasicity and response to augmentation. Calf Veins: No evidence of thrombus. Normal compressibility and flow  on color Doppler imaging. Superficial Great Saphenous Vein: No evidence of thrombus. Normal compressibility. Venous Reflux:  None. Other Findings: Moderate amount of subcutaneous edema is noted at the level of the right thigh and lower leg. LEFT LOWER EXTREMITY Common Femoral Vein: No evidence of thrombus. Normal compressibility, respiratory phasicity and response to augmentation. Saphenofemoral Junction: No evidence of thrombus. Normal compressibility and flow on color Doppler imaging. Profunda Femoral Vein: No evidence of thrombus. Normal compressibility and flow on color Doppler imaging. Femoral Vein: No evidence of thrombus. Normal compressibility, respiratory phasicity and response to augmentation. Popliteal Vein: No evidence of thrombus. Normal compressibility, respiratory phasicity and response to augmentation. Calf Veins: No evidence of thrombus. Normal compressibility and flow on color Doppler imaging. Superficial Great Saphenous Vein: No evidence of thrombus. Normal compressibility. Venous Reflux:  None. Other Findings: Moderate amount of subcutaneous edema is noted at the level of the left lower leg. IMPRESSION: Degraded examination without evidence of DVT within either lower extremity. Electronically Signed   By: Sandi Mariscal M.D.   On: 01/10/2019 13:58   Dg Chest Port 1 View  Result Date: 01/13/2019 CLINICAL DATA:  85 year old male with respiratory failure, smoker. Negative for COVID-19 on 01/09/2019. EXAM: PORTABLE CHEST 1 VIEW COMPARISON:  01/12/2019 and earlier. FINDINGS: Portable AP semi upright view at at 0525 hours. Endotracheal tube tip between the level the clavicles and carina. Stable visible enteric tube and right PICC line. Stable lung volumes and ventilation with continued dense left lung base opacity obscuring the diaphragm and patchy/confluent multifocal right lower lung opacity. Stable increased interstitial markings in both lungs which might reflect vascular congestion on chronic lung  disease. Stable cardiac size and mediastinal contours. Paucity of bowel gas in the upper abdomen. IMPRESSION: 1. Stable lines and tubes. 2. Stable ventilation with continued dense left lower lobe collapse or consolidation and patchy right lung base opacity. 3. Suspect pulmonary vascular congestion superimposed on chronic interstitial lung disease. Electronically Signed   By: Genevie Ann M.D.   On: 01/13/2019 08:39   Dg Chest Port 1 View  Result Date: 01/12/2019 CLINICAL DATA:  85 year old male with a history PICC placement EXAM: PORTABLE CHEST 1 VIEW COMPARISON:  01/12/2019 FINDINGS: Interval placement of right upper extremity PICC which appears to terminate near the superior cavoatrial junction. Cardiomediastinal silhouette likely unchanged  in size and contour. Heart borders partially obscured by lung/pleural disease. Persistently low lung volumes with mixed interstitial and airspace opacities most prominent at the lung bases. Obscuration left hemidiaphragm in the left heart border persists. No change in aeration. No pneumothorax. Endotracheal tube terminates 4.8 cm above the carina. Gastric tube terminates in the left upper quadrant. IMPRESSION: Interval placement of right upper extremity PICC which appears to terminate superior cavoatrial junction. Similar appearance of mixed interstitial and airspace disease with likely left greater than right pleural effusion and associated atelectasis/consolidation. Unchanged endotracheal tube and gastric tube. Electronically Signed   By: Corrie Mckusick D.O.   On: 01/12/2019 17:01   Dg Chest Port 1 View  Result Date: 01/12/2019 CLINICAL DATA:  85 year old male NG tube placement. EXAM: PORTABLE CHEST 1 VIEW COMPARISON:  0046 hours today and earlier. FINDINGS: Portable AP semi upright view at 0810 hours. Enteric tube in place and courses to the abdomen. Tip is not included, but the side hole might be visible near the GE junction. Stable lung volumes and ventilation. Stable  cardiac size and mediastinal contours. Endotracheal tube tip is not as well visualized but likely stable at the level the clavicles. IMPRESSION: 1. Enteric tube courses to the abdomen. Tip is not included, but the side hole might be visible near the GE junction. Consider advancing 5 cm to ensure side hole placement within the stomach. 2. Otherwise stable chest. Electronically Signed   By: Genevie Ann M.D.   On: 01/12/2019 08:26   Dg Chest Port 1 View  Result Date: 01/12/2019 CLINICAL DATA:  85 year old male with respiratory failure. EXAM: PORTABLE CHEST 1 VIEW COMPARISON:  Chest radiograph dated 01/11/2019 FINDINGS: Evaluation is limited due to patient's positioning and superimposition of the mandible over the upper mediastinum. The tip of the endotracheal tube is poorly visualized but appears in similar position as before. An enteric tube extends into the left upper abdomen with side-port in the region of the gastroesophageal junction. Recommend further advancing the tube into the stomach by at least additional 5 7 cm. Cardiomegaly with vascular congestion and edema. Left lung base density. Overall slight interval improvement in the aeration of the left lung compared to the prior radiograph. No pneumothorax. No acute osseous pathology. IMPRESSION: 1. Slight interval improvement in the aeration of the left lung compared to the prior radiograph. 2. Cardiomegaly with vascular congestion and edema. 3. Left lung base density, not significantly changed. 4. Lines and tubes as described above. The enteric tube can be advanced for optimal positioning. Electronically Signed   By: Anner Crete M.D.   On: 01/12/2019 01:24   Dg Chest Port 1 View  Result Date: 01/11/2019 CLINICAL DATA:  Shortness of breath.  Respiratory failure. EXAM: PORTABLE CHEST 1 VIEW COMPARISON:  01/10/2019 FINDINGS: The endotracheal tube terminates above the carina by approximately 4.5 cm. The enteric tube it peers to extend below the left  hemidiaphragm. The cardiac silhouette is enlarged. Aortic calcifications are noted. There is a left basilar/retrocardiac opacity, similar to prior study. No pneumothorax. Prominent interstitial lung markings are again noted IMPRESSION: 1. Lines and tubes as above. 2. Persistent left basilar airspace opacity, not significantly improved from prior study. Electronically Signed   By: Constance Holster M.D.   On: 01/11/2019 13:51   Dg Chest Port 1 View  Result Date: 01/10/2019 CLINICAL DATA:  Altered mental status. Pneumonia. EXAM: PORTABLE CHEST 1 VIEW COMPARISON:  01/10/2019. FINDINGS: Rotated radiograph. Endotracheal tube appears approximately 2 cm above carina. Enteric tube last  side hole is at GE junction. The heart is enlarged. There is considerable opacity in the LEFT hemithorax, probably stable, likely atelectasis, consolidation, and effusion. Moderate vascular congestion appears worse throughout the visible RIGHT lung fields. IMPRESSION: Worsening aeration. Support tubes as described. Electronically Signed   By: Staci Righter M.D.   On: 01/10/2019 10:29   Dg Chest Port 1 View  Result Date: 01/10/2019 CLINICAL DATA:  85 year old male with enteric tube placement. EXAM: PORTABLE CHEST 1 VIEW COMPARISON:  Chest radiograph dated 01/09/2019 FINDINGS: Evaluation is limited due to patient's positioning and portable technique. An enteric tube is noted with side-port just distal to the region of the gastroesophageal junction and tip in the proximal stomach. An endotracheal tube appears above the carina. No significant change in the appearance of the lungs or heart since the earlier radiograph. IMPRESSION: Enteric tube with side-port just distal to the GE junction and tip in the proximal stomach. The tube can be further advanced by an additional 5-7 cm for optimal positioning. Electronically Signed   By: Anner Crete M.D.   On: 01/10/2019 02:49   Dg Chest Port 1 View  Result Date: 01/09/2019 CLINICAL DATA:   85 year old male status post enteric tube placement. EXAM: PORTABLE CHEST 1 VIEW COMPARISON:  Earlier radiograph dated 01/09/2019 FINDINGS: Endotracheal tube with tip above the carina in similar position. An enteric tube extends into the upper abdomen with tip likely in the proximal stomach. The side port of the enteric tube is in the region of the GE junction. Recommend further advancing of the tube by approximately 5 cm. Left-sided pleural effusion and associated atelectasis or infiltrate similar or slightly progressed since the prior radiograph. No other interval change. IMPRESSION: Enteric tube with side-port in the region of the GE junction and tip in the proximal stomach. Recommend further advancing the tube by approximately 5 cm. Electronically Signed   By: Anner Crete M.D.   On: 01/09/2019 23:47   Dg Chest Portable 1 View  Result Date: 01/09/2019 CLINICAL DATA:  85 year old male intubated. Left lower lobe consolidation, bilateral pleural effusions. EXAM: PORTABLE CHEST 1 VIEW COMPARISON:  Chest CT and radiographs earlier today FINDINGS: Portable AP supine view at 1759 hours. Endotracheal tube tip in good position between the level the clavicles and carina. Enteric tube courses to the left abdomen, tip not included. The side hole might be visible at the level of the distal esophagus (arrow). Dense left lung base opacification. Stable cardiac size and mediastinal contours. No pneumothorax. Stable pulmonary vascularity. No confluent right lung opacity. IMPRESSION: 1. ET tube in good position. 2. Enteric tube courses to the left abdomen but the side hole might still be in the distal esophagus. Recommend advancing 5-6 more centimeters. 3. Left lower consolidation and effusion as seen by CT earlier today. Electronically Signed   By: Genevie Ann M.D.   On: 01/09/2019 18:22   Dg Chest Port 1 View  Result Date: 01/09/2019 CLINICAL DATA:  Altered mental status. EXAM: PORTABLE CHEST 1 VIEW COMPARISON:   Radiographs of December 01, 2018. FINDINGS: Stable cardiomegaly. Atherosclerosis of thoracic aorta is noted. No pneumothorax is noted. Bilateral interstitial densities are noted concerning for pulmonary edema. Mild left pleural effusion is noted with probable associated atelectasis or infiltrate. Bony thorax is unremarkable. IMPRESSION: Stable cardiomegaly with bilateral interstitial densities concerning for edema. Mild left pleural effusion is noted with associated atelectasis or infiltrate. Aortic Atherosclerosis (ICD10-I70.0). Electronically Signed   By: Marijo Conception M.D.   On: 01/09/2019 11:50  [place] Ekg Site Rite  Result Date: 01/12/2019 If Site Rite image not attached, placement could not be confirmed due to current cardiac rhythm.  US Abdomen Limited Ruq  Result Date: 01/10/2019 CLINICAL DATA:  Abnormal LFTs on admission. Previous cholecystectomy. EXAM: ULTRASOUND ABDOMEN LIMITED RIGHT UPPER QUADRANT COMPARISON:  None. FINDINGS: Gallbladder: Previous cholecystectomy. Common bile duct: Diameter: 8.1 mm. Liver: No focal lesion identified. Within normal limits in parenchymal echogenicity. Small amount of perihepatic fluid. Portal vein is patent on color Doppler imaging with normal direction of blood flow towards the liver. IMPRESSION: Small amount of perihepatic fluid, otherwise no acute findings. Previous cholecystectomy. Electronically Signed   By: Marin Olp M.D.   On: 01/10/2019 13:27    Orson Eva, DO  [facility] Pager [phone]  If 7PM-7AM, please contact night-coverage [email or web address] Password [email or web address] 01/19/2019, 5:48 PM   LOS: 10 days

## 2019-01-20 DIAGNOSIS — J15212 Pneumonia due to Methicillin resistant Staphylococcus aureus: Secondary | ICD-10-CM | POA: Diagnosis not present

## 2019-01-20 DIAGNOSIS — R5381 Other malaise: Secondary | ICD-10-CM | POA: Diagnosis not present

## 2019-01-20 DIAGNOSIS — K219 Gastro-esophageal reflux disease without esophagitis: Secondary | ICD-10-CM | POA: Diagnosis not present

## 2019-01-20 DIAGNOSIS — L89610 Pressure ulcer of right heel, unstageable: Secondary | ICD-10-CM | POA: Diagnosis not present

## 2019-01-20 DIAGNOSIS — R531 Weakness: Secondary | ICD-10-CM | POA: Diagnosis not present

## 2019-01-20 DIAGNOSIS — D696 Thrombocytopenia, unspecified: Secondary | ICD-10-CM | POA: Diagnosis not present

## 2019-01-20 DIAGNOSIS — N402 Nodular prostate without lower urinary tract symptoms: Secondary | ICD-10-CM | POA: Diagnosis not present

## 2019-01-20 DIAGNOSIS — M1A9XX0 Chronic gout, unspecified, without tophus (tophi): Secondary | ICD-10-CM | POA: Diagnosis not present

## 2019-01-20 DIAGNOSIS — I959 Hypotension, unspecified: Secondary | ICD-10-CM | POA: Diagnosis not present

## 2019-01-20 DIAGNOSIS — I5031 Acute diastolic (congestive) heart failure: Secondary | ICD-10-CM | POA: Diagnosis not present

## 2019-01-20 DIAGNOSIS — R300 Dysuria: Secondary | ICD-10-CM | POA: Diagnosis not present

## 2019-01-20 DIAGNOSIS — R609 Edema, unspecified: Secondary | ICD-10-CM | POA: Diagnosis not present

## 2019-01-20 DIAGNOSIS — J189 Pneumonia, unspecified organism: Secondary | ICD-10-CM | POA: Diagnosis not present

## 2019-01-20 DIAGNOSIS — J449 Chronic obstructive pulmonary disease, unspecified: Secondary | ICD-10-CM | POA: Diagnosis not present

## 2019-01-20 DIAGNOSIS — D61818 Other pancytopenia: Secondary | ICD-10-CM | POA: Diagnosis not present

## 2019-01-20 DIAGNOSIS — R41841 Cognitive communication deficit: Secondary | ICD-10-CM | POA: Diagnosis not present

## 2019-01-20 DIAGNOSIS — M1990 Unspecified osteoarthritis, unspecified site: Secondary | ICD-10-CM | POA: Diagnosis not present

## 2019-01-20 DIAGNOSIS — R1310 Dysphagia, unspecified: Secondary | ICD-10-CM | POA: Diagnosis not present

## 2019-01-20 DIAGNOSIS — R1312 Dysphagia, oropharyngeal phase: Secondary | ICD-10-CM | POA: Diagnosis not present

## 2019-01-20 DIAGNOSIS — D649 Anemia, unspecified: Secondary | ICD-10-CM | POA: Diagnosis not present

## 2019-01-20 DIAGNOSIS — Z7401 Bed confinement status: Secondary | ICD-10-CM | POA: Diagnosis not present

## 2019-01-20 DIAGNOSIS — F339 Major depressive disorder, recurrent, unspecified: Secondary | ICD-10-CM | POA: Diagnosis not present

## 2019-01-20 DIAGNOSIS — R262 Difficulty in walking, not elsewhere classified: Secondary | ICD-10-CM | POA: Diagnosis not present

## 2019-01-20 DIAGNOSIS — J9601 Acute respiratory failure with hypoxia: Secondary | ICD-10-CM | POA: Diagnosis not present

## 2019-01-20 DIAGNOSIS — A4102 Sepsis due to Methicillin resistant Staphylococcus aureus: Secondary | ICD-10-CM | POA: Diagnosis not present

## 2019-01-20 DIAGNOSIS — A419 Sepsis, unspecified organism: Secondary | ICD-10-CM | POA: Diagnosis not present

## 2019-01-20 DIAGNOSIS — F99 Mental disorder, not otherwise specified: Secondary | ICD-10-CM | POA: Diagnosis not present

## 2019-01-20 DIAGNOSIS — J181 Lobar pneumonia, unspecified organism: Secondary | ICD-10-CM | POA: Diagnosis not present

## 2019-01-20 DIAGNOSIS — F419 Anxiety disorder, unspecified: Secondary | ICD-10-CM | POA: Diagnosis not present

## 2019-01-20 DIAGNOSIS — R4182 Altered mental status, unspecified: Secondary | ICD-10-CM | POA: Diagnosis not present

## 2019-01-20 DIAGNOSIS — M6281 Muscle weakness (generalized): Secondary | ICD-10-CM | POA: Diagnosis not present

## 2019-01-20 LAB — BASIC METABOLIC PANEL
Anion gap: 10 (ref 5–15)
BUN: 11 mg/dL (ref 8–23)
CO2: 23 mmol/L (ref 22–32)
Calcium: 8.2 mg/dL — ABNORMAL LOW (ref 8.9–10.3)
Chloride: 101 mmol/L (ref 98–111)
Creatinine, Ser: 0.59 mg/dL — ABNORMAL LOW (ref 0.61–1.24)
GFR calc Af Amer: >60 mL/min (ref >60)
GFR calc non Af Amer: >60 mL/min (ref >60)
Glucose, Bld: 83 mg/dL (ref 70–99)
Potassium: 3.5 mmol/L (ref 3.5–5.1)
Sodium: 134 mmol/L — ABNORMAL LOW (ref 135–145)

## 2019-01-20 LAB — MAGNESIUM: Magnesium: 1.8 mg/dL (ref 1.7–2.4)

## 2019-01-20 NOTE — TOC Transition Note (Addendum)
Transition of Care [facility]) - CM/SW Discharge Note   Patient Details  Name: John Munoz MRN: [id number] Date of Birth: 1933/01/29  Transition of Care [facility]) CM/SW Contact:  Gailene Youkhana, Chauncey Reading, RN Phone Number: 01/20/2019, 11:52 AM   Clinical Narrative:   Patient discharging to [name] today. DC clinicals faxed along with negative Covid result from yesterday.  Bedside RN calling report and will arrange for EMS transport.  Niece, Julianne Rice updated.   Final next level of care: Skilled Nursing Facility Barriers to Discharge: SNF Pending bed offer   Patient Goals and CMS Choice Patient states their goals for this hospitalization and ongoing recovery are:: Get strong CMS Medicare.gov Compare Post Acute Care list provided to:: Patient Choice offered to / list presented to : Patient(Other family)  Discharge Placement              Patient chooses bed at: [facility] Patient to be transferred to facility by: [facility] Name of family member notified: Julianne Rice Patient and family notified of of transfer: 01/20/19  Discharge Plan and Services In-house Referral: Clinical Social Work Discharge Planning Services: CM Consult                         Readmission Risk Interventions No flowsheet data found.

## 2019-01-20 NOTE — Progress Notes (Signed)
Physical Therapy Treatment Patient Details Name: John Munoz MRN: [id number] DOB: [birth year] Today's Date: 01/20/2019    History of Present Illness He was admitted with acute metabolic encephalopathy.  This appears to be related to healthcare associated pneumonia with sepsis.  He briefly required pressor support.  He also had acute heart failure at that time.  He was discovered to have left lower lobe pneumonia.  He had acute hypoxic and hypercapnic respiratory failure requiring intubation and mechanical ventilation.  He was able to be extubated approximately 72 hours ago.    PT Comments    Patient demonstrates increased BLE for completing sit to stands, transfers and able to use BUE to hold on to RW with less stiffness.  Patient maintained sitting balance while completing BLE ROM/strengthening exercises while seated at bedside and limited to a few steps using RW to transfer to chair due to weakness and poor standing balance.  Patient tolerated sitting up in chair with mechanical lift harness in place - nursing staff notified.  Patient will benefit from continued physical therapy in hospital and recommended venue below to increase strength, balance, endurance for safe ADLs and gait.    Follow Up Recommendations  SNF     Equipment Recommendations  None recommended by PT    Recommendations for Other Services       Precautions / Restrictions Precautions Precautions: Fall Restrictions Weight Bearing Restrictions: No    Mobility  Bed Mobility Overal bed mobility: Needs Assistance Bed Mobility: Supine to Sit     Supine to sit: Mod assist;Max assist     General bed mobility comments: slow labored movement with difficulty using BUE due to stiffness  Transfers Overall transfer level: Needs assistance Equipment used: Rolling walker (2 wheeled) Transfers: Sit to/from [facility] Sit to Stand: Mod assist;Max assist Stand pivot transfers: Max assist        General transfer comment: Patient demonstrates increased BLE strength for completing sit to stands and able to use RW to transfer to chair  Ambulation/Gait Ambulation/Gait assistance: Max assist Gait Distance (Feet): 3 Feet Assistive device: Rolling walker (2 wheeled) Gait Pattern/deviations: Decreased step length - right;Decreased step length - left;Decreased stride length Gait velocity: slow   General Gait Details: Patient limited to 3-4 unsteady slow labored steps to transfer to chair due to BLE weakness and poor standing balance   Stairs             Wheelchair Mobility    Modified Rankin (Stroke Patients Only)       Balance Overall balance assessment: Needs assistance Sitting-balance support: Feet supported;Bilateral upper extremity supported Sitting balance-Leahy Scale: Fair Sitting balance - Comments: demonstrates improvement for keeping trunk in midline supporting with BUE   Standing balance support: Bilateral upper extremity supported Standing balance-Leahy Scale: Poor Standing balance comment: using RW                            Cognition Arousal/Alertness: Awake/alert Behavior During Therapy: WFL for tasks assessed/performed Overall Cognitive Status: Within Functional Limits for tasks assessed                                        Exercises General Exercises - Lower Extremity Ankle Circles/Pumps: Seated;AAROM;Both;10 reps;Strengthening Long Arc Quad: Seated;AAROM;Strengthening;Both;10 reps Hip Flexion/Marching: Seated;Strengthening;AAROM;Both;10 reps    General Comments        Pertinent  Vitals/Pain Pain Assessment: No/denies pain    Home Living                      Prior Function            PT Goals (current goals can now be found in the care plan section) Acute Rehab PT Goals Patient Stated Goal: return home after rehab PT Goal Formulation: With patient Time For Goal Achievement: 01/29/19 Potential  to Achieve Goals: Good Progress towards PT goals: Progressing toward goals    Frequency    Min 3X/week      PT Plan Current plan remains appropriate    Co-evaluation              AM-PAC PT "6 Clicks" Mobility   Outcome Measure  Help needed turning from your back to your side while in a flat bed without using bedrails?: A Lot Help needed moving from lying on your back to sitting on the side of a flat bed without using bedrails?: A Lot Help needed moving to and from a bed to a chair (including a wheelchair)?: A Lot Help needed standing up from a chair using your arms (e.g., wheelchair or bedside chair)?: A Lot Help needed to walk in hospital room?: Total Help needed climbing 3-5 steps with a railing? : Total 6 Click Score: 10    End of Session   Activity Tolerance: Patient tolerated treatment well;Patient limited by fatigue Patient left: in chair;with call bell/phone within reach Nurse Communication: Mobility status PT Visit Diagnosis: Unsteadiness on feet (R26.81);Other abnormalities of gait and mobility (R26.89);Muscle weakness (generalized) (M62.81)     Time: 3335-4562 PT Time Calculation (min) (ACUTE ONLY): 33 min  Charges:  $Therapeutic Exercise: 8-22 mins $Therapeutic Activity: 8-22 mins                     12:44 PM, 01/20/19 Lonell Grandchild, MPT Physical Therapist with [facility] 336 [phone] office [phone] mobile phone

## 2019-01-20 NOTE — Progress Notes (Signed)
AVS paperwork provided to EMS. Pt d/c via stretcher to [facility] in [place]. Called report to [place] at [facility].

## 2019-01-20 NOTE — Discharge Summary (Signed)
Physician Discharge Summary  John Munoz HTD:[id number] DOB: August 10, 1933 DOA: 01/09/2019  PCP: Neale Burly, MD  Admit date: 01/09/2019 Discharge date: 01/20/2019  Admitted From: Home Disposition:  SNF  Recommendations for Outpatient Follow-up:  1. Follow up with PCP in 1-2 weeks 2. Please obtain BMP/CBC in one week   Discharge Condition: Stable CODE STATUS: DNR Diet recommendation: dysphagia 1 with thin   Brief/Interim Summary: 85 year old male with a history of COPD, BPH, GERD, and anxiety presenting with altered mental status. The patient is currently on a ventilator and is unable to provide any history. Attempt to contact family was unsuccessful. Apparently, the patient was noted to have somnolence and altered mental status by home health aide 2 days prior to this admission. At baseline, the patient is alert and oriented and usually ambulates with a walker. Apparently, the patient had a recent admission to [facility] 12/01/2018 through 12/06/2018 where he was treated for right heel ulcer and infection. In addition, the patient also recently finished antibiotics for presumptive UTI. He was subsequently discharged home. In the emergency department, the patient was noted to be somnolent minimally responsive to pain. He was initially placed on BiPAP, but was subsequently intubated secondary to his inability to protect his airway. The patient was initially hypothermic with a temperature 91.1 F and hypotensive.  Discharge Diagnoses:  Sepsis -Present at time of admission -Secondary toHCAP -Check procalcitonin<0.10 -Continue vancomycin and cefepime pending culture data -Lactic acid peaked 0.9 -GI prophylaxis -Weaned off levophed evening 01/10/19 -5/11--started solucortef with improved CBGs (was hypoglycemic) and weaned off levophed--wean solucortef -has been weaned off steroids -sepsis physiology resolved  Acute respiratory failure with hypoxia -Secondary to  pneumonia -Pulmonary consultappreciated -Wean to extubation as tolerated -personally reviewed cXR--increased interstital markings, LL opacity  Lobar pneumonia/HCAP--MRSA pneumonia -01/09/2019 CT chest--LLLinfiltrate with air bronchograms and left-sided effusion -Continued vancomycin -d/c cefepime  -tracheal aspirate S. aureus -MRSA screen positive -COVID-19--NEG -finished 10 days of abx during hospitalization  Elevated troponin -likely due to demand ischemia in the setting of sepsis -01/11/19 echo--EF 55- 60%, no WMA, mild to moderate MR -personally reviewed EKG--sinus, nonspecific T wave changes -overall trend flat  FEN -hypokalemia--replete -hypomagnesemia--replete -start enteral feeding  Lower extremity edema -Venous duplex rule out DVT--neg  Acutediastolic CHF -BNP 7262 -Echocardiogram--EF 55-60%, no WMA, mild to mod MR -fluid resuscitated initially -CVP 10-12 -now clinically euvolemic  Transaminasemia -Secondary to sepsis -Trend-->resolved -hep B/C neg  Right heel decubitus ulcer--unstageable -Present at the time of admission -Does not appear to be clinically infected on examination -Wound care consultappreciated--Enzymatic debridement and saline moist gauze. Cover with dry dressing. Change daily.   Thrombocytopenia -due to sepsis -M35---5974 -RBCfolic BULA--4536 -check coags--elevated PTT -fibrinogen--435  -resolved  Anxiety/depression -restart Celexa     Discharge Instructions  Discharge Instructions    Diet - low sodium heart healthy   Complete by:  As directed    Increase activity slowly   Complete by:  As directed      Allergies as of 01/20/2019   No Known Allergies     Medication List    STOP taking these medications   busPIRone 15 MG tablet Commonly known as:  BUSPAR   ciprofloxacin 500 MG tablet Commonly known as:  CIPRO   gabapentin 300 MG capsule Commonly known as:  NEURONTIN   Melatonin 5 MG Tabs    traMADol 50 MG tablet Commonly known as:  ULTRAM     TAKE these medications   acetaminophen 500 MG tablet Commonly known as:  TYLENOL Take 1,000 mg by mouth every 6 (six) hours as needed. Pain   allopurinol 300 MG tablet Commonly known as:  ZYLOPRIM Take 300 mg by mouth daily.   aspirin 81 MG chewable tablet Chew 81 mg by mouth daily.   calcium gluconate 500 MG tablet Take 1 tablet by mouth daily.   citalopram 20 MG tablet Commonly known as:  CELEXA Take 20 mg by mouth daily.   Fish Oil 1000 MG Caps Take 1 capsule by mouth 2 (two) times a day.   folic acid 1 MG tablet Commonly known as:  FOLVITE Take 1 mg by mouth daily.   midodrine 2.5 MG tablet Commonly known as:  PROAMATINE Take 2.5 mg by mouth 2 (two) times a day.   omeprazole 20 MG capsule Commonly known as:  PRILOSEC Take 20 mg by mouth daily.   sulfamethoxazole-trimethoprim 400-80 MG tablet Commonly known as:  Bactrim Take 1 tablet by mouth 2 (two) times daily.   tamsulosin 0.4 MG Caps capsule Commonly known as:  FLOMAX Take 0.4 mg by mouth daily.   vitamin B-12 1000 MCG tablet Commonly known as:  CYANOCOBALAMIN Take 1,000 mcg by mouth daily.      Contact information for after-discharge care    [facility] .   Service:  Skilled Nursing Contact information: [address] [place] [phone]             No Known Allergies  Consultations:  pulmonary   Procedures/Studies: Ct Head Wo Contrast  Result Date: 01/09/2019 CLINICAL DATA:  85 year old male with altered mental status. EXAM: CT HEAD WITHOUT CONTRAST TECHNIQUE: Contiguous axial images were obtained from the base of the skull through the vertex without intravenous contrast. COMPARISON:  02/28/2017 CT and prior studies FINDINGS: Brain: No evidence of acute infarction, hemorrhage, hydrocephalus, extra-axial collection or mass lesion/mass effect. Atrophy and chronic  small-vessel white matter ischemic changes again noted. Vascular: Atherosclerotic calcifications again noted Skull: Normal. Negative for fracture or focal lesion. Sinuses/Orbits: No acute finding. Other: None. IMPRESSION: 1. No evidence of acute intracranial abnormality. 2. Atrophy and chronic small-vessel white matter ischemic changes. Electronically Signed   By: Margarette Canada M.D.   On: 01/09/2019 15:20   Ct Chest W Contrast  Result Date: 01/09/2019 CLINICAL DATA:  Cough and pleural effusion EXAM: CT CHEST WITH CONTRAST TECHNIQUE: Multidetector CT imaging of the chest was performed during intravenous contrast administration. CONTRAST:  54mL OMNIPAQUE 300 COMPARISON:  Plain film from earlier in the same day. FINDINGS: Cardiovascular: Thoracic aorta and its branches demonstrate atherosclerotic calcifications without aneurysmal dilatation or dissection. Diffuse coronary calcifications are seen. The heart is at the upper limits of normal in size. Pulmonary artery is well visualized. No large central pulmonary embolus is seen. Timing was not performed for embolus evaluation. Mediastinum/Nodes: The esophagus as visualized is within normal limits. Thoracic inlet is within normal limits. Scattered small mediastinal lymph nodes are seen likely reactive in nature. Lungs/Pleura: Mild right lower lobe atelectatic changes are noted with small right pleural effusion. A larger left-sided pleural effusion is noted with associated lower lobe infiltrate with air bronchograms consistent with pneumonia. These changes are similar to that seen on recent plain film. No parenchymal nodules are seen. Upper Abdomen: Prior cholecystectomy is noted. No acute abnormality is noted within the abdomen. Musculoskeletal: Degenerative changes of the thoracic spine are noted. IMPRESSION: Bilateral pleural effusions left greater than right. Left lower lobe infiltrate consistent with acute pneumonia.  Mild right basilar atelectasis is seen. Aortic  Atherosclerosis (ICD10-I70.0). Electronically Signed   By: Inez Catalina M.D.   On: 01/09/2019 15:17   US Venous Img Lower Bilateral  Result Date: 01/10/2019 CLINICAL DATA:  Bilateral lower extremity pain and edema. Evaluate for DVT. EXAM: BILATERAL LOWER EXTREMITY VENOUS DOPPLER ULTRASOUND TECHNIQUE: Gray-scale sonography with graded compression, as well as color Doppler and duplex ultrasound were performed to evaluate the lower extremity deep venous systems from the level of the common femoral vein and including the common femoral, femoral, profunda femoral, popliteal and calf veins including the posterior tibial, peroneal and gastrocnemius veins when visible. The superficial great saphenous vein was also interrogated. Spectral Doppler was utilized to evaluate flow at rest and with distal augmentation maneuvers in the common femoral, femoral and popliteal veins. COMPARISON:  None. FINDINGS: RIGHT LOWER EXTREMITY The right common femoral vein, saphenofemoral junction and deep femoral vein were unable to be visualized secondary to central venous catheter bandages. Femoral Vein: No evidence of thrombus. Normal compressibility, respiratory phasicity and response to augmentation. Popliteal Vein: No evidence of thrombus. Normal compressibility, respiratory phasicity and response to augmentation. Calf Veins: No evidence of thrombus. Normal compressibility and flow on color Doppler imaging. Superficial Great Saphenous Vein: No evidence of thrombus. Normal compressibility. Venous Reflux:  None. Other Findings: Moderate amount of subcutaneous edema is noted at the level of the right thigh and lower leg. LEFT LOWER EXTREMITY Common Femoral Vein: No evidence of thrombus. Normal compressibility, respiratory phasicity and response to augmentation. Saphenofemoral Junction: No evidence of thrombus. Normal compressibility and flow on color Doppler imaging. Profunda Femoral Vein: No evidence of thrombus. Normal compressibility  and flow on color Doppler imaging. Femoral Vein: No evidence of thrombus. Normal compressibility, respiratory phasicity and response to augmentation. Popliteal Vein: No evidence of thrombus. Normal compressibility, respiratory phasicity and response to augmentation. Calf Veins: No evidence of thrombus. Normal compressibility and flow on color Doppler imaging. Superficial Great Saphenous Vein: No evidence of thrombus. Normal compressibility. Venous Reflux:  None. Other Findings: Moderate amount of subcutaneous edema is noted at the level of the left lower leg. IMPRESSION: Degraded examination without evidence of DVT within either lower extremity. Electronically Signed   By: Sandi Mariscal M.D.   On: 01/10/2019 13:58   Dg Chest Port 1 View  Result Date: 01/13/2019 CLINICAL DATA:  85 year old male with respiratory failure, smoker. Negative for COVID-19 on 01/09/2019. EXAM: PORTABLE CHEST 1 VIEW COMPARISON:  01/12/2019 and earlier. FINDINGS: Portable AP semi upright view at at 0525 hours. Endotracheal tube tip between the level the clavicles and carina. Stable visible enteric tube and right PICC line. Stable lung volumes and ventilation with continued dense left lung base opacity obscuring the diaphragm and patchy/confluent multifocal right lower lung opacity. Stable increased interstitial markings in both lungs which might reflect vascular congestion on chronic lung disease. Stable cardiac size and mediastinal contours. Paucity of bowel gas in the upper abdomen. IMPRESSION: 1. Stable lines and tubes. 2. Stable ventilation with continued dense left lower lobe collapse or consolidation and patchy right lung base opacity. 3. Suspect pulmonary vascular congestion superimposed on chronic interstitial lung disease. Electronically Signed   By: Genevie Ann M.D.   On: 01/13/2019 08:39   Dg Chest Port 1 View  Result Date: 01/12/2019 CLINICAL DATA:  85 year old male with a history PICC placement EXAM: PORTABLE CHEST 1 VIEW  COMPARISON:  01/12/2019 FINDINGS: Interval placement of right upper extremity PICC which appears to terminate near the superior cavoatrial junction. Cardiomediastinal  silhouette likely unchanged in size and contour. Heart borders partially obscured by lung/pleural disease. Persistently low lung volumes with mixed interstitial and airspace opacities most prominent at the lung bases. Obscuration left hemidiaphragm in the left heart border persists. No change in aeration. No pneumothorax. Endotracheal tube terminates 4.8 cm above the carina. Gastric tube terminates in the left upper quadrant. IMPRESSION: Interval placement of right upper extremity PICC which appears to terminate superior cavoatrial junction. Similar appearance of mixed interstitial and airspace disease with likely left greater than right pleural effusion and associated atelectasis/consolidation. Unchanged endotracheal tube and gastric tube. Electronically Signed   By: Corrie Mckusick D.O.   On: 01/12/2019 17:01   Dg Chest Port 1 View  Result Date: 01/12/2019 CLINICAL DATA:  85 year old male NG tube placement. EXAM: PORTABLE CHEST 1 VIEW COMPARISON:  0046 hours today and earlier. FINDINGS: Portable AP semi upright view at 0810 hours. Enteric tube in place and courses to the abdomen. Tip is not included, but the side hole might be visible near the GE junction. Stable lung volumes and ventilation. Stable cardiac size and mediastinal contours. Endotracheal tube tip is not as well visualized but likely stable at the level the clavicles. IMPRESSION: 1. Enteric tube courses to the abdomen. Tip is not included, but the side hole might be visible near the GE junction. Consider advancing 5 cm to ensure side hole placement within the stomach. 2. Otherwise stable chest. Electronically Signed   By: Genevie Ann M.D.   On: 01/12/2019 08:26   Dg Chest Port 1 View  Result Date: 01/12/2019 CLINICAL DATA:  85 year old male with respiratory failure. EXAM: PORTABLE CHEST  1 VIEW COMPARISON:  Chest radiograph dated 01/11/2019 FINDINGS: Evaluation is limited due to patient's positioning and superimposition of the mandible over the upper mediastinum. The tip of the endotracheal tube is poorly visualized but appears in similar position as before. An enteric tube extends into the left upper abdomen with side-port in the region of the gastroesophageal junction. Recommend further advancing the tube into the stomach by at least additional 5 7 cm. Cardiomegaly with vascular congestion and edema. Left lung base density. Overall slight interval improvement in the aeration of the left lung compared to the prior radiograph. No pneumothorax. No acute osseous pathology. IMPRESSION: 1. Slight interval improvement in the aeration of the left lung compared to the prior radiograph. 2. Cardiomegaly with vascular congestion and edema. 3. Left lung base density, not significantly changed. 4. Lines and tubes as described above. The enteric tube can be advanced for optimal positioning. Electronically Signed   By: Anner Crete M.D.   On: 01/12/2019 01:24   Dg Chest Port 1 View  Result Date: 01/11/2019 CLINICAL DATA:  Shortness of breath.  Respiratory failure. EXAM: PORTABLE CHEST 1 VIEW COMPARISON:  01/10/2019 FINDINGS: The endotracheal tube terminates above the carina by approximately 4.5 cm. The enteric tube it peers to extend below the left hemidiaphragm. The cardiac silhouette is enlarged. Aortic calcifications are noted. There is a left basilar/retrocardiac opacity, similar to prior study. No pneumothorax. Prominent interstitial lung markings are again noted IMPRESSION: 1. Lines and tubes as above. 2. Persistent left basilar airspace opacity, not significantly improved from prior study. Electronically Signed   By: Constance Holster M.D.   On: 01/11/2019 13:51   Dg Chest Port 1 View  Result Date: 01/10/2019 CLINICAL DATA:  Altered mental status. Pneumonia. EXAM: PORTABLE CHEST 1 VIEW  COMPARISON:  01/10/2019. FINDINGS: Rotated radiograph. Endotracheal tube appears approximately 2 cm above carina.  Enteric tube last side hole is at GE junction. The heart is enlarged. There is considerable opacity in the LEFT hemithorax, probably stable, likely atelectasis, consolidation, and effusion. Moderate vascular congestion appears worse throughout the visible RIGHT lung fields. IMPRESSION: Worsening aeration. Support tubes as described. Electronically Signed   By: Staci Righter M.D.   On: 01/10/2019 10:29   Dg Chest Port 1 View  Result Date: 01/10/2019 CLINICAL DATA:  85 year old male with enteric tube placement. EXAM: PORTABLE CHEST 1 VIEW COMPARISON:  Chest radiograph dated 01/09/2019 FINDINGS: Evaluation is limited due to patient's positioning and portable technique. An enteric tube is noted with side-port just distal to the region of the gastroesophageal junction and tip in the proximal stomach. An endotracheal tube appears above the carina. No significant change in the appearance of the lungs or heart since the earlier radiograph. IMPRESSION: Enteric tube with side-port just distal to the GE junction and tip in the proximal stomach. The tube can be further advanced by an additional 5-7 cm for optimal positioning. Electronically Signed   By: Anner Crete M.D.   On: 01/10/2019 02:49   Dg Chest Port 1 View  Result Date: 01/09/2019 CLINICAL DATA:  85 year old male status post enteric tube placement. EXAM: PORTABLE CHEST 1 VIEW COMPARISON:  Earlier radiograph dated 01/09/2019 FINDINGS: Endotracheal tube with tip above the carina in similar position. An enteric tube extends into the upper abdomen with tip likely in the proximal stomach. The side port of the enteric tube is in the region of the GE junction. Recommend further advancing of the tube by approximately 5 cm. Left-sided pleural effusion and associated atelectasis or infiltrate similar or slightly progressed since the prior radiograph. No  other interval change. IMPRESSION: Enteric tube with side-port in the region of the GE junction and tip in the proximal stomach. Recommend further advancing the tube by approximately 5 cm. Electronically Signed   By: Anner Crete M.D.   On: 01/09/2019 23:47   Dg Chest Portable 1 View  Result Date: 01/09/2019 CLINICAL DATA:  85 year old male intubated. Left lower lobe consolidation, bilateral pleural effusions. EXAM: PORTABLE CHEST 1 VIEW COMPARISON:  Chest CT and radiographs earlier today FINDINGS: Portable AP supine view at 1759 hours. Endotracheal tube tip in good position between the level the clavicles and carina. Enteric tube courses to the left abdomen, tip not included. The side hole might be visible at the level of the distal esophagus (arrow). Dense left lung base opacification. Stable cardiac size and mediastinal contours. No pneumothorax. Stable pulmonary vascularity. No confluent right lung opacity. IMPRESSION: 1. ET tube in good position. 2. Enteric tube courses to the left abdomen but the side hole might still be in the distal esophagus. Recommend advancing 5-6 more centimeters. 3. Left lower consolidation and effusion as seen by CT earlier today. Electronically Signed   By: Genevie Ann M.D.   On: 01/09/2019 18:22   Dg Chest Port 1 View  Result Date: 01/09/2019 CLINICAL DATA:  Altered mental status. EXAM: PORTABLE CHEST 1 VIEW COMPARISON:  Radiographs of December 01, 2018. FINDINGS: Stable cardiomegaly. Atherosclerosis of thoracic aorta is noted. No pneumothorax is noted. Bilateral interstitial densities are noted concerning for pulmonary edema. Mild left pleural effusion is noted with probable associated atelectasis or infiltrate. Bony thorax is unremarkable. IMPRESSION: Stable cardiomegaly with bilateral interstitial densities concerning for edema. Mild left pleural effusion is noted with associated atelectasis or infiltrate. Aortic Atherosclerosis (ICD10-I70.0). Electronically Signed   By: Bobbe Medico.D.  On: 01/09/2019 11:50   [place] Ekg Site Rite  Result Date: 01/12/2019 If Site Rite image not attached, placement could not be confirmed due to current cardiac rhythm.  US Abdomen Limited Ruq  Result Date: 01/10/2019 CLINICAL DATA:  Abnormal LFTs on admission. Previous cholecystectomy. EXAM: ULTRASOUND ABDOMEN LIMITED RIGHT UPPER QUADRANT COMPARISON:  None. FINDINGS: Gallbladder: Previous cholecystectomy. Common bile duct: Diameter: 8.1 mm. Liver: No focal lesion identified. Within normal limits in parenchymal echogenicity. Small amount of perihepatic fluid. Portal vein is patent on color Doppler imaging with normal direction of blood flow towards the liver. IMPRESSION: Small amount of perihepatic fluid, otherwise no acute findings. Previous cholecystectomy. Electronically Signed   By: Marin Olp M.D.   On: 01/10/2019 13:27        Discharge Exam: Vitals:   01/20/19 1000 01/20/19 1053  BP: (!) 102/54   Pulse:  74  Resp: 20 (!) 22  Temp:    SpO2:  97%   Vitals:   01/20/19 0746 01/20/19 0900 01/20/19 1000 01/20/19 1053  BP:   (!) 102/54   Pulse:    74  Resp:  (!) 21 20 (!) 22  Temp:      TempSrc:      SpO2: 90%   97%  Weight:      Height:        General: Pt is alert, awake, not in acute distress Cardiovascular: RRR, S1/S2 +, no rubs, no gallops Respiratory: CTA bilaterally, no wheezing, no rhonchi Abdominal: Soft, NT, ND, bowel sounds + Extremities: no edema, no cyanosis   The results of significant diagnostics from this hospitalization (including imaging, microbiology, ancillary and laboratory) are listed below for reference.    Significant Diagnostic Studies: Ct Head Wo Contrast  Result Date: 01/09/2019 CLINICAL DATA:  85 year old male with altered mental status. EXAM: CT HEAD WITHOUT CONTRAST TECHNIQUE: Contiguous axial images were obtained from the base of the skull through the vertex without intravenous contrast. COMPARISON:  02/28/2017 CT and prior  studies FINDINGS: Brain: No evidence of acute infarction, hemorrhage, hydrocephalus, extra-axial collection or mass lesion/mass effect. Atrophy and chronic small-vessel white matter ischemic changes again noted. Vascular: Atherosclerotic calcifications again noted Skull: Normal. Negative for fracture or focal lesion. Sinuses/Orbits: No acute finding. Other: None. IMPRESSION: 1. No evidence of acute intracranial abnormality. 2. Atrophy and chronic small-vessel white matter ischemic changes. Electronically Signed   By: Margarette Canada M.D.   On: 01/09/2019 15:20   Ct Chest W Contrast  Result Date: 01/09/2019 CLINICAL DATA:  Cough and pleural effusion EXAM: CT CHEST WITH CONTRAST TECHNIQUE: Multidetector CT imaging of the chest was performed during intravenous contrast administration. CONTRAST:  51mL OMNIPAQUE 300 COMPARISON:  Plain film from earlier in the same day. FINDINGS: Cardiovascular: Thoracic aorta and its branches demonstrate atherosclerotic calcifications without aneurysmal dilatation or dissection. Diffuse coronary calcifications are seen. The heart is at the upper limits of normal in size. Pulmonary artery is well visualized. No large central pulmonary embolus is seen. Timing was not performed for embolus evaluation. Mediastinum/Nodes: The esophagus as visualized is within normal limits. Thoracic inlet is within normal limits. Scattered small mediastinal lymph nodes are seen likely reactive in nature. Lungs/Pleura: Mild right lower lobe atelectatic changes are noted with small right pleural effusion. A larger left-sided pleural effusion is noted with associated lower lobe infiltrate with air bronchograms consistent with pneumonia. These changes are similar to that seen on recent plain film. No parenchymal nodules are seen. Upper Abdomen: Prior cholecystectomy is noted. No acute abnormality  is noted within the abdomen. Musculoskeletal: Degenerative changes of the thoracic spine are noted. IMPRESSION:  Bilateral pleural effusions left greater than right. Left lower lobe infiltrate consistent with acute pneumonia. Mild right basilar atelectasis is seen. Aortic Atherosclerosis (ICD10-I70.0). Electronically Signed   By: Inez Catalina M.D.   On: 01/09/2019 15:17   US Venous Img Lower Bilateral  Result Date: 01/10/2019 CLINICAL DATA:  Bilateral lower extremity pain and edema. Evaluate for DVT. EXAM: BILATERAL LOWER EXTREMITY VENOUS DOPPLER ULTRASOUND TECHNIQUE: Gray-scale sonography with graded compression, as well as color Doppler and duplex ultrasound were performed to evaluate the lower extremity deep venous systems from the level of the common femoral vein and including the common femoral, femoral, profunda femoral, popliteal and calf veins including the posterior tibial, peroneal and gastrocnemius veins when visible. The superficial great saphenous vein was also interrogated. Spectral Doppler was utilized to evaluate flow at rest and with distal augmentation maneuvers in the common femoral, femoral and popliteal veins. COMPARISON:  None. FINDINGS: RIGHT LOWER EXTREMITY The right common femoral vein, saphenofemoral junction and deep femoral vein were unable to be visualized secondary to central venous catheter bandages. Femoral Vein: No evidence of thrombus. Normal compressibility, respiratory phasicity and response to augmentation. Popliteal Vein: No evidence of thrombus. Normal compressibility, respiratory phasicity and response to augmentation. Calf Veins: No evidence of thrombus. Normal compressibility and flow on color Doppler imaging. Superficial Great Saphenous Vein: No evidence of thrombus. Normal compressibility. Venous Reflux:  None. Other Findings: Moderate amount of subcutaneous edema is noted at the level of the right thigh and lower leg. LEFT LOWER EXTREMITY Common Femoral Vein: No evidence of thrombus. Normal compressibility, respiratory phasicity and response to augmentation. Saphenofemoral  Junction: No evidence of thrombus. Normal compressibility and flow on color Doppler imaging. Profunda Femoral Vein: No evidence of thrombus. Normal compressibility and flow on color Doppler imaging. Femoral Vein: No evidence of thrombus. Normal compressibility, respiratory phasicity and response to augmentation. Popliteal Vein: No evidence of thrombus. Normal compressibility, respiratory phasicity and response to augmentation. Calf Veins: No evidence of thrombus. Normal compressibility and flow on color Doppler imaging. Superficial Great Saphenous Vein: No evidence of thrombus. Normal compressibility. Venous Reflux:  None. Other Findings: Moderate amount of subcutaneous edema is noted at the level of the left lower leg. IMPRESSION: Degraded examination without evidence of DVT within either lower extremity. Electronically Signed   By: Sandi Mariscal M.D.   On: 01/10/2019 13:58   Dg Chest Port 1 View  Result Date: 01/13/2019 CLINICAL DATA:  85 year old male with respiratory failure, smoker. Negative for COVID-19 on 01/09/2019. EXAM: PORTABLE CHEST 1 VIEW COMPARISON:  01/12/2019 and earlier. FINDINGS: Portable AP semi upright view at at 0525 hours. Endotracheal tube tip between the level the clavicles and carina. Stable visible enteric tube and right PICC line. Stable lung volumes and ventilation with continued dense left lung base opacity obscuring the diaphragm and patchy/confluent multifocal right lower lung opacity. Stable increased interstitial markings in both lungs which might reflect vascular congestion on chronic lung disease. Stable cardiac size and mediastinal contours. Paucity of bowel gas in the upper abdomen. IMPRESSION: 1. Stable lines and tubes. 2. Stable ventilation with continued dense left lower lobe collapse or consolidation and patchy right lung base opacity. 3. Suspect pulmonary vascular congestion superimposed on chronic interstitial lung disease. Electronically Signed   By: Genevie Ann M.D.   On:  01/13/2019 08:39   Dg Chest Port 1 View  Result Date: 01/12/2019 CLINICAL DATA:  85 year old male with  a history PICC placement EXAM: PORTABLE CHEST 1 VIEW COMPARISON:  01/12/2019 FINDINGS: Interval placement of right upper extremity PICC which appears to terminate near the superior cavoatrial junction. Cardiomediastinal silhouette likely unchanged in size and contour. Heart borders partially obscured by lung/pleural disease. Persistently low lung volumes with mixed interstitial and airspace opacities most prominent at the lung bases. Obscuration left hemidiaphragm in the left heart border persists. No change in aeration. No pneumothorax. Endotracheal tube terminates 4.8 cm above the carina. Gastric tube terminates in the left upper quadrant. IMPRESSION: Interval placement of right upper extremity PICC which appears to terminate superior cavoatrial junction. Similar appearance of mixed interstitial and airspace disease with likely left greater than right pleural effusion and associated atelectasis/consolidation. Unchanged endotracheal tube and gastric tube. Electronically Signed   By: Corrie Mckusick D.O.   On: 01/12/2019 17:01   Dg Chest Port 1 View  Result Date: 01/12/2019 CLINICAL DATA:  85 year old male NG tube placement. EXAM: PORTABLE CHEST 1 VIEW COMPARISON:  0046 hours today and earlier. FINDINGS: Portable AP semi upright view at 0810 hours. Enteric tube in place and courses to the abdomen. Tip is not included, but the side hole might be visible near the GE junction. Stable lung volumes and ventilation. Stable cardiac size and mediastinal contours. Endotracheal tube tip is not as well visualized but likely stable at the level the clavicles. IMPRESSION: 1. Enteric tube courses to the abdomen. Tip is not included, but the side hole might be visible near the GE junction. Consider advancing 5 cm to ensure side hole placement within the stomach. 2. Otherwise stable chest. Electronically Signed   By: Genevie Ann  M.D.   On: 01/12/2019 08:26   Dg Chest Port 1 View  Result Date: 01/12/2019 CLINICAL DATA:  85 year old male with respiratory failure. EXAM: PORTABLE CHEST 1 VIEW COMPARISON:  Chest radiograph dated 01/11/2019 FINDINGS: Evaluation is limited due to patient's positioning and superimposition of the mandible over the upper mediastinum. The tip of the endotracheal tube is poorly visualized but appears in similar position as before. An enteric tube extends into the left upper abdomen with side-port in the region of the gastroesophageal junction. Recommend further advancing the tube into the stomach by at least additional 5 7 cm. Cardiomegaly with vascular congestion and edema. Left lung base density. Overall slight interval improvement in the aeration of the left lung compared to the prior radiograph. No pneumothorax. No acute osseous pathology. IMPRESSION: 1. Slight interval improvement in the aeration of the left lung compared to the prior radiograph. 2. Cardiomegaly with vascular congestion and edema. 3. Left lung base density, not significantly changed. 4. Lines and tubes as described above. The enteric tube can be advanced for optimal positioning. Electronically Signed   By: Anner Crete M.D.   On: 01/12/2019 01:24   Dg Chest Port 1 View  Result Date: 01/11/2019 CLINICAL DATA:  Shortness of breath.  Respiratory failure. EXAM: PORTABLE CHEST 1 VIEW COMPARISON:  01/10/2019 FINDINGS: The endotracheal tube terminates above the carina by approximately 4.5 cm. The enteric tube it peers to extend below the left hemidiaphragm. The cardiac silhouette is enlarged. Aortic calcifications are noted. There is a left basilar/retrocardiac opacity, similar to prior study. No pneumothorax. Prominent interstitial lung markings are again noted IMPRESSION: 1. Lines and tubes as above. 2. Persistent left basilar airspace opacity, not significantly improved from prior study. Electronically Signed   By: Constance Holster M.D.    On: 01/11/2019 13:51   Dg Chest [facility]  Result Date: 01/10/2019 CLINICAL DATA:  Altered mental status. Pneumonia. EXAM: PORTABLE CHEST 1 VIEW COMPARISON:  01/10/2019. FINDINGS: Rotated radiograph. Endotracheal tube appears approximately 2 cm above carina. Enteric tube last side hole is at GE junction. The heart is enlarged. There is considerable opacity in the LEFT hemithorax, probably stable, likely atelectasis, consolidation, and effusion. Moderate vascular congestion appears worse throughout the visible RIGHT lung fields. IMPRESSION: Worsening aeration. Support tubes as described. Electronically Signed   By: Staci Righter M.D.   On: 01/10/2019 10:29   Dg Chest Port 1 View  Result Date: 01/10/2019 CLINICAL DATA:  85 year old male with enteric tube placement. EXAM: PORTABLE CHEST 1 VIEW COMPARISON:  Chest radiograph dated 01/09/2019 FINDINGS: Evaluation is limited due to patient's positioning and portable technique. An enteric tube is noted with side-port just distal to the region of the gastroesophageal junction and tip in the proximal stomach. An endotracheal tube appears above the carina. No significant change in the appearance of the lungs or heart since the earlier radiograph. IMPRESSION: Enteric tube with side-port just distal to the GE junction and tip in the proximal stomach. The tube can be further advanced by an additional 5-7 cm for optimal positioning. Electronically Signed   By: Anner Crete M.D.   On: 01/10/2019 02:49   Dg Chest Port 1 View  Result Date: 01/09/2019 CLINICAL DATA:  85 year old male status post enteric tube placement. EXAM: PORTABLE CHEST 1 VIEW COMPARISON:  Earlier radiograph dated 01/09/2019 FINDINGS: Endotracheal tube with tip above the carina in similar position. An enteric tube extends into the upper abdomen with tip likely in the proximal stomach. The side port of the enteric tube is in the region of the GE junction. Recommend further advancing of the tube by  approximately 5 cm. Left-sided pleural effusion and associated atelectasis or infiltrate similar or slightly progressed since the prior radiograph. No other interval change. IMPRESSION: Enteric tube with side-port in the region of the GE junction and tip in the proximal stomach. Recommend further advancing the tube by approximately 5 cm. Electronically Signed   By: Anner Crete M.D.   On: 01/09/2019 23:47   Dg Chest Portable 1 View  Result Date: 01/09/2019 CLINICAL DATA:  85 year old male intubated. Left lower lobe consolidation, bilateral pleural effusions. EXAM: PORTABLE CHEST 1 VIEW COMPARISON:  Chest CT and radiographs earlier today FINDINGS: Portable AP supine view at 1759 hours. Endotracheal tube tip in good position between the level the clavicles and carina. Enteric tube courses to the left abdomen, tip not included. The side hole might be visible at the level of the distal esophagus (arrow). Dense left lung base opacification. Stable cardiac size and mediastinal contours. No pneumothorax. Stable pulmonary vascularity. No confluent right lung opacity. IMPRESSION: 1. ET tube in good position. 2. Enteric tube courses to the left abdomen but the side hole might still be in the distal esophagus. Recommend advancing 5-6 more centimeters. 3. Left lower consolidation and effusion as seen by CT earlier today. Electronically Signed   By: Genevie Ann M.D.   On: 01/09/2019 18:22   Dg Chest Port 1 View  Result Date: 01/09/2019 CLINICAL DATA:  Altered mental status. EXAM: PORTABLE CHEST 1 VIEW COMPARISON:  Radiographs of December 01, 2018. FINDINGS: Stable cardiomegaly. Atherosclerosis of thoracic aorta is noted. No pneumothorax is noted. Bilateral interstitial densities are noted concerning for pulmonary edema. Mild left pleural effusion is noted with probable associated atelectasis or infiltrate. Bony thorax is unremarkable. IMPRESSION: Stable cardiomegaly with bilateral interstitial densities concerning  for edema.  Mild left pleural effusion is noted with associated atelectasis or infiltrate. Aortic Atherosclerosis (ICD10-I70.0). Electronically Signed   By: Marijo Conception M.D.   On: 01/09/2019 11:50   [place] Ekg Site Rite  Result Date: 01/12/2019 If Site Rite image not attached, placement could not be confirmed due to current cardiac rhythm.  US Abdomen Limited Ruq  Result Date: 01/10/2019 CLINICAL DATA:  Abnormal LFTs on admission. Previous cholecystectomy. EXAM: ULTRASOUND ABDOMEN LIMITED RIGHT UPPER QUADRANT COMPARISON:  None. FINDINGS: Gallbladder: Previous cholecystectomy. Common bile duct: Diameter: 8.1 mm. Liver: No focal lesion identified. Within normal limits in parenchymal echogenicity. Small amount of perihepatic fluid. Portal vein is patent on color Doppler imaging with normal direction of blood flow towards the liver. IMPRESSION: Small amount of perihepatic fluid, otherwise no acute findings. Previous cholecystectomy. Electronically Signed   By: Marin Olp M.D.   On: 01/10/2019 13:27     Microbiology: Recent Results (from the past 240 hour(s))  SARS Coronavirus 2 (CEPHEID - Performed in [place] hospital lab), Hosp Order     Status: None   Collection Time: 01/19/19 11:23 AM  Result Value Ref Range Status   SARS Coronavirus 2 NEGATIVE NEGATIVE Final    Comment: (NOTE) If result is NEGATIVE SARS-CoV-2 target nucleic acids are NOT DETECTED. The SARS-CoV-2 RNA is generally detectable in upper and lower  respiratory specimens during the acute phase of infection. The lowest  concentration of SARS-CoV-2 viral copies this assay can detect is 250  copies / mL. A negative result does not preclude SARS-CoV-2 infection  and should not be used as the sole basis for treatment or other  patient management decisions.  A negative result may occur with  improper specimen collection / handling, submission of specimen other  than nasopharyngeal swab, presence of viral mutation(s) within the  areas  targeted by this assay, and inadequate number of viral copies  (<250 copies / mL). A negative result must be combined with clinical  observations, patient history, and epidemiological information. If result is POSITIVE SARS-CoV-2 target nucleic acids are DETECTED. The SARS-CoV-2 RNA is generally detectable in upper and lower  respiratory specimens dur ing the acute phase of infection.  Positive  results are indicative of active infection with SARS-CoV-2.  Clinical  correlation with patient history and other diagnostic information is  necessary to determine patient infection status.  Positive results do  not rule out bacterial infection or co-infection with other viruses. If result is PRESUMPTIVE POSTIVE SARS-CoV-2 nucleic acids MAY BE PRESENT.   A presumptive positive result was obtained on the submitted specimen  and confirmed on repeat testing.  While 2019 novel coronavirus  (SARS-CoV-2) nucleic acids may be present in the submitted sample  additional confirmatory testing may be necessary for epidemiological  and / or clinical management purposes  to differentiate between  SARS-CoV-2 and other Sarbecovirus currently known to infect humans.  If clinically indicated additional testing with an alternate test  methodology [phone]) is advised. The SARS-CoV-2 RNA is generally  detectable in upper and lower respiratory sp ecimens during the acute  phase of infection. The expected result is Negative. Fact Sheet for Patients:  StrictlyIdeas.no Fact Sheet for Healthcare Providers: BankingDealers.co.za This test is not yet approved or cleared by the [place] FDA and has been authorized for detection and/or diagnosis of SARS-CoV-2 by FDA under an Emergency Use Authorization (EUA).  This EUA will remain in effect (meaning this test can be used) for the duration of the COVID-19  declaration under Section 564(b)(1) of the Act, 21 U.S.C. section  360bbb-3(b)(1), unless the authorization is terminated or revoked sooner. Performed at [facility], [address], [place], [place] [address]      Labs: Basic Metabolic Panel: Recent Labs  Lab 01/14/19 1130 01/15/19 0448 01/16/19 0416 01/17/19 0354 01/19/19 0422 01/19/19 1241 01/20/19 0428  NA  --  139 137 137  --  135 134*  K  --  3.0* 3.2* 3.5  --  3.3* 3.5  CL  --  103 103 101  --  103 101  CO2  --  28 26 27   --  24 23  GLUCOSE  --  81 77 99  --  91 83  BUN  --  21 16 14   --  11 11  CREATININE  --  0.48* 0.42* 0.43* 0.62 0.59* 0.59*  CALCIUM  --  8.2* 8.1* 8.4*  --  8.4* 8.2*  MG 1.9 1.6* 1.9  --   --  1.5* 1.8   Liver Function Tests: Recent Labs  Lab 01/19/19 1241  AST 39  ALT 32  ALKPHOS 96  BILITOT 0.4  PROT 5.6*  ALBUMIN 2.8*   No results for input(s): LIPASE, AMYLASE in the last 168 hours. No results for input(s): AMMONIA in the last 168 hours. CBC: Recent Labs  Lab 01/14/19 0424 01/14/19 0812 01/19/19 1241  WBC 5.2 5.0 3.8*  HGB 9.6* 9.1* 9.0*  HCT 28.6* 28.0* 28.2*  MCV 95.3 96.9 101.1*  PLT 71* 72* 248   Cardiac Enzymes: No results for input(s): CKTOTAL, CKMB, CKMBINDEX, TROPONINI in the last 168 hours. BNP: Invalid input(s): POCBNP CBG: Recent Labs  Lab 01/13/19 2110 01/13/19 2352 01/14/19 0431 01/14/19 0744 01/14/19 1107  GLUCAP 132* 129* 132* 132* 116*    Time coordinating discharge:  36 minutes  Signed:  Orson Eva, DO [facility] Pager: [phone] 01/20/2019, 10:57 AM

## 2019-01-26 DIAGNOSIS — R300 Dysuria: Secondary | ICD-10-CM | POA: Diagnosis not present

## 2019-01-26 DIAGNOSIS — L89610 Pressure ulcer of right heel, unstageable: Secondary | ICD-10-CM | POA: Diagnosis not present

## 2019-01-26 DIAGNOSIS — J449 Chronic obstructive pulmonary disease, unspecified: Secondary | ICD-10-CM | POA: Diagnosis not present

## 2019-01-26 DIAGNOSIS — J15212 Pneumonia due to Methicillin resistant Staphylococcus aureus: Secondary | ICD-10-CM | POA: Diagnosis not present

## 2019-01-27 ENCOUNTER — Other Ambulatory Visit: Payer: Self-pay | Admitting: *Deleted

## 2019-01-27 NOTE — Patient Outreach (Signed)
Writer attended telephonic IDT meeting involving [facility] SNF discharge planner and [facility] UM team.  Member is currently receiving rehab therapy at [facility] SNF. Mr. John Munoz is being assessed for potential [facility] Care Management needs as a benefit of his [employer] insurance.   Member lived with his niece prior to hospitalization. Discharge planner to follow up on APS status, per chart records, which was filed during recent hospitalization. Also dc planner to follow up regarding goals of care per [facility] UM MD Director request.  Writer to continue to follow and collaborate with [facility] UM team and facility staff.    Marthenia Rolling, MSN-Ed, RN,BSN [place] Acute Care Coordinator [phone]

## 2019-01-31 DIAGNOSIS — D649 Anemia, unspecified: Secondary | ICD-10-CM | POA: Diagnosis not present

## 2019-01-31 DIAGNOSIS — J15212 Pneumonia due to Methicillin resistant Staphylococcus aureus: Secondary | ICD-10-CM | POA: Diagnosis not present

## 2019-01-31 DIAGNOSIS — L89610 Pressure ulcer of right heel, unstageable: Secondary | ICD-10-CM | POA: Diagnosis not present

## 2019-01-31 DIAGNOSIS — J449 Chronic obstructive pulmonary disease, unspecified: Secondary | ICD-10-CM | POA: Diagnosis not present

## 2019-02-07 ENCOUNTER — Other Ambulatory Visit: Payer: Self-pay | Admitting: *Deleted

## 2019-02-07 DIAGNOSIS — J15212 Pneumonia due to Methicillin resistant Staphylococcus aureus: Secondary | ICD-10-CM | POA: Diagnosis not present

## 2019-02-07 DIAGNOSIS — D649 Anemia, unspecified: Secondary | ICD-10-CM | POA: Diagnosis not present

## 2019-02-07 DIAGNOSIS — L89610 Pressure ulcer of right heel, unstageable: Secondary | ICD-10-CM | POA: Diagnosis not present

## 2019-02-07 DIAGNOSIS — J449 Chronic obstructive pulmonary disease, unspecified: Secondary | ICD-10-CM | POA: Diagnosis not present

## 2019-02-07 NOTE — Patient Outreach (Signed)
[facility] UM RN reports member is slated for discharge from [facility] this week.  Writer advised to contact John Munoz niece who is member's primary caregiver.  Telephone call made to John Munoz (niece) at [phone] to discuss [place] Management program. John Munoz (niece) confirmed patient identifiers. Member's niece states she takes care of John Munoz. States between her taking care of him and home health, "that will be enough."   Niece denies having medication, transportation, disease management, or community resource needs. Encouraged John Munoz to have close follow up with John Munoz Primary Care MD post SNF discharge. She states she will.   Also after further discussion and discovery, noted that John Munoz is not a [facility] provider.   Nonetheless, patient's niece declined [place] Management program services.   Will make [facility] UM RN aware.   Marthenia Rolling, MSN-Ed, RN,BSN [place] Acute Care Coordinator [phone]

## 2019-02-09 DIAGNOSIS — R1310 Dysphagia, unspecified: Secondary | ICD-10-CM | POA: Diagnosis not present

## 2019-02-10 DIAGNOSIS — R1310 Dysphagia, unspecified: Secondary | ICD-10-CM | POA: Diagnosis not present

## 2019-02-16 DIAGNOSIS — M542 Cervicalgia: Secondary | ICD-10-CM | POA: Diagnosis not present

## 2019-02-16 DIAGNOSIS — J189 Pneumonia, unspecified organism: Secondary | ICD-10-CM | POA: Diagnosis not present

## 2019-02-23 DIAGNOSIS — R269 Unspecified abnormalities of gait and mobility: Secondary | ICD-10-CM | POA: Diagnosis not present

## 2019-02-23 DIAGNOSIS — R262 Difficulty in walking, not elsewhere classified: Secondary | ICD-10-CM | POA: Diagnosis not present

## 2019-02-23 DIAGNOSIS — M6281 Muscle weakness (generalized): Secondary | ICD-10-CM | POA: Diagnosis not present

## 2019-02-25 DIAGNOSIS — M6281 Muscle weakness (generalized): Secondary | ICD-10-CM | POA: Diagnosis not present

## 2019-02-25 DIAGNOSIS — R269 Unspecified abnormalities of gait and mobility: Secondary | ICD-10-CM | POA: Diagnosis not present

## 2019-02-25 DIAGNOSIS — R262 Difficulty in walking, not elsewhere classified: Secondary | ICD-10-CM | POA: Diagnosis not present

## 2019-03-11 DIAGNOSIS — R262 Difficulty in walking, not elsewhere classified: Secondary | ICD-10-CM | POA: Diagnosis not present

## 2019-03-11 DIAGNOSIS — M6281 Muscle weakness (generalized): Secondary | ICD-10-CM | POA: Diagnosis not present

## 2019-03-11 DIAGNOSIS — R269 Unspecified abnormalities of gait and mobility: Secondary | ICD-10-CM | POA: Diagnosis not present

## 2019-03-11 DIAGNOSIS — J9601 Acute respiratory failure with hypoxia: Secondary | ICD-10-CM | POA: Diagnosis not present

## 2019-03-15 DIAGNOSIS — I959 Hypotension, unspecified: Secondary | ICD-10-CM | POA: Diagnosis not present

## 2019-03-15 DIAGNOSIS — K210 Gastro-esophageal reflux disease with esophagitis: Secondary | ICD-10-CM | POA: Diagnosis not present

## 2019-03-15 DIAGNOSIS — M109 Gout, unspecified: Secondary | ICD-10-CM | POA: Diagnosis not present

## 2019-03-15 DIAGNOSIS — J449 Chronic obstructive pulmonary disease, unspecified: Secondary | ICD-10-CM | POA: Diagnosis not present

## 2019-04-04 ENCOUNTER — Other Ambulatory Visit: Payer: Self-pay

## 2019-04-06 DIAGNOSIS — L8943 Pressure ulcer of contiguous site of back, buttock and hip, stage 3: Secondary | ICD-10-CM | POA: Diagnosis not present

## 2019-04-09 DIAGNOSIS — L89310 Pressure ulcer of right buttock, unstageable: Secondary | ICD-10-CM | POA: Diagnosis not present

## 2019-04-09 DIAGNOSIS — L89322 Pressure ulcer of left buttock, stage 2: Secondary | ICD-10-CM | POA: Diagnosis not present

## 2019-04-09 DIAGNOSIS — Z7982 Long term (current) use of aspirin: Secondary | ICD-10-CM | POA: Diagnosis not present

## 2019-04-11 DIAGNOSIS — Z7982 Long term (current) use of aspirin: Secondary | ICD-10-CM | POA: Diagnosis not present

## 2019-04-11 DIAGNOSIS — L89322 Pressure ulcer of left buttock, stage 2: Secondary | ICD-10-CM | POA: Diagnosis not present

## 2019-04-11 DIAGNOSIS — L89310 Pressure ulcer of right buttock, unstageable: Secondary | ICD-10-CM | POA: Diagnosis not present

## 2019-04-12 DIAGNOSIS — L89322 Pressure ulcer of left buttock, stage 2: Secondary | ICD-10-CM | POA: Diagnosis not present

## 2019-04-12 DIAGNOSIS — Z7982 Long term (current) use of aspirin: Secondary | ICD-10-CM | POA: Diagnosis not present

## 2019-04-12 DIAGNOSIS — L89310 Pressure ulcer of right buttock, unstageable: Secondary | ICD-10-CM | POA: Diagnosis not present

## 2019-04-14 DIAGNOSIS — L89310 Pressure ulcer of right buttock, unstageable: Secondary | ICD-10-CM | POA: Diagnosis not present

## 2019-04-14 DIAGNOSIS — Z7982 Long term (current) use of aspirin: Secondary | ICD-10-CM | POA: Diagnosis not present

## 2019-04-14 DIAGNOSIS — L89322 Pressure ulcer of left buttock, stage 2: Secondary | ICD-10-CM | POA: Diagnosis not present

## 2019-04-20 DIAGNOSIS — L89310 Pressure ulcer of right buttock, unstageable: Secondary | ICD-10-CM | POA: Diagnosis not present

## 2019-04-20 DIAGNOSIS — L89322 Pressure ulcer of left buttock, stage 2: Secondary | ICD-10-CM | POA: Diagnosis not present

## 2019-04-20 DIAGNOSIS — Z7982 Long term (current) use of aspirin: Secondary | ICD-10-CM | POA: Diagnosis not present

## 2019-04-22 DIAGNOSIS — L89310 Pressure ulcer of right buttock, unstageable: Secondary | ICD-10-CM | POA: Diagnosis not present

## 2019-04-22 DIAGNOSIS — Z7982 Long term (current) use of aspirin: Secondary | ICD-10-CM | POA: Diagnosis not present

## 2019-04-22 DIAGNOSIS — L89322 Pressure ulcer of left buttock, stage 2: Secondary | ICD-10-CM | POA: Diagnosis not present

## 2019-04-25 DIAGNOSIS — L89322 Pressure ulcer of left buttock, stage 2: Secondary | ICD-10-CM | POA: Diagnosis not present

## 2019-04-25 DIAGNOSIS — L89310 Pressure ulcer of right buttock, unstageable: Secondary | ICD-10-CM | POA: Diagnosis not present

## 2019-04-25 DIAGNOSIS — Z7982 Long term (current) use of aspirin: Secondary | ICD-10-CM | POA: Diagnosis not present

## 2019-04-28 DIAGNOSIS — Z7982 Long term (current) use of aspirin: Secondary | ICD-10-CM | POA: Diagnosis not present

## 2019-04-28 DIAGNOSIS — L89322 Pressure ulcer of left buttock, stage 2: Secondary | ICD-10-CM | POA: Diagnosis not present

## 2019-04-28 DIAGNOSIS — L89310 Pressure ulcer of right buttock, unstageable: Secondary | ICD-10-CM | POA: Diagnosis not present

## 2019-05-02 DIAGNOSIS — L89310 Pressure ulcer of right buttock, unstageable: Secondary | ICD-10-CM | POA: Diagnosis not present

## 2019-05-02 DIAGNOSIS — Z7982 Long term (current) use of aspirin: Secondary | ICD-10-CM | POA: Diagnosis not present

## 2019-05-02 DIAGNOSIS — L89322 Pressure ulcer of left buttock, stage 2: Secondary | ICD-10-CM | POA: Diagnosis not present

## 2019-05-09 DIAGNOSIS — L89310 Pressure ulcer of right buttock, unstageable: Secondary | ICD-10-CM | POA: Diagnosis not present

## 2019-05-09 DIAGNOSIS — L89322 Pressure ulcer of left buttock, stage 2: Secondary | ICD-10-CM | POA: Diagnosis not present

## 2019-05-09 DIAGNOSIS — Z7982 Long term (current) use of aspirin: Secondary | ICD-10-CM | POA: Diagnosis not present

## 2019-05-10 DIAGNOSIS — L89310 Pressure ulcer of right buttock, unstageable: Secondary | ICD-10-CM | POA: Diagnosis not present

## 2019-05-10 DIAGNOSIS — L89322 Pressure ulcer of left buttock, stage 2: Secondary | ICD-10-CM | POA: Diagnosis not present

## 2019-05-10 DIAGNOSIS — Z7982 Long term (current) use of aspirin: Secondary | ICD-10-CM | POA: Diagnosis not present

## 2019-05-11 DIAGNOSIS — I739 Peripheral vascular disease, unspecified: Secondary | ICD-10-CM | POA: Diagnosis not present

## 2019-05-11 DIAGNOSIS — L89893 Pressure ulcer of other site, stage 3: Secondary | ICD-10-CM | POA: Diagnosis not present

## 2019-05-19 DIAGNOSIS — Z7982 Long term (current) use of aspirin: Secondary | ICD-10-CM | POA: Diagnosis not present

## 2019-05-19 DIAGNOSIS — L89310 Pressure ulcer of right buttock, unstageable: Secondary | ICD-10-CM | POA: Diagnosis not present

## 2019-05-19 DIAGNOSIS — L89322 Pressure ulcer of left buttock, stage 2: Secondary | ICD-10-CM | POA: Diagnosis not present

## 2019-06-02 DEATH — deceased

## 2019-07-28 IMAGING — US VENOUS DOPPLER ULTRASOUND OF  LOWER EXTREMITIES
1 series · 13 of 24 positions shown · non-contrast
Comparison: None.

CLINICAL DATA: Bilateral lower extremity pain and edema. Evaluate
for DVT.



[Series 1: venous doppler ultrasound of lower extremities · 13 of 59 slices shown]
[im 1/59]
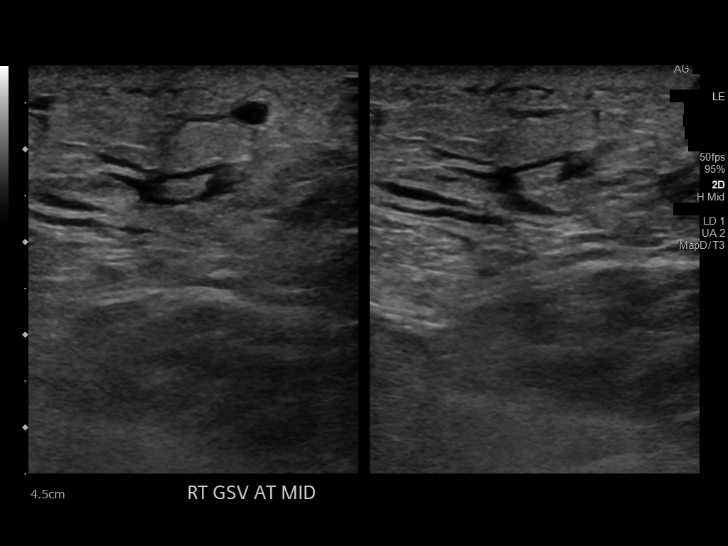
[im 6/59]
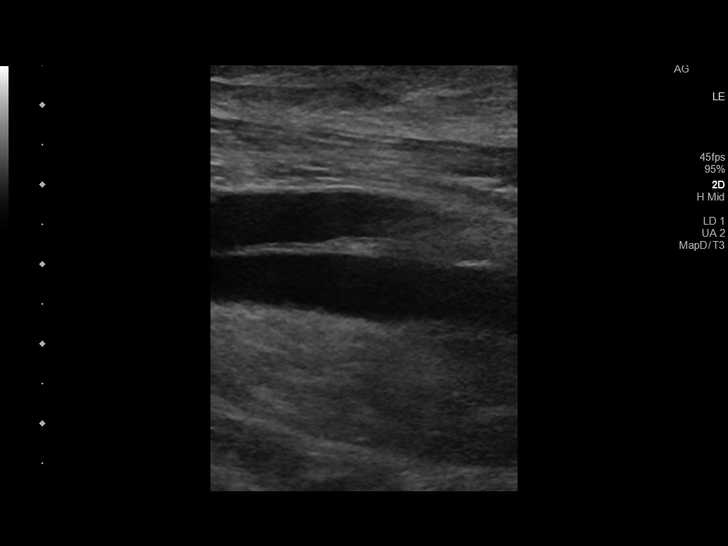
[im 11/59]
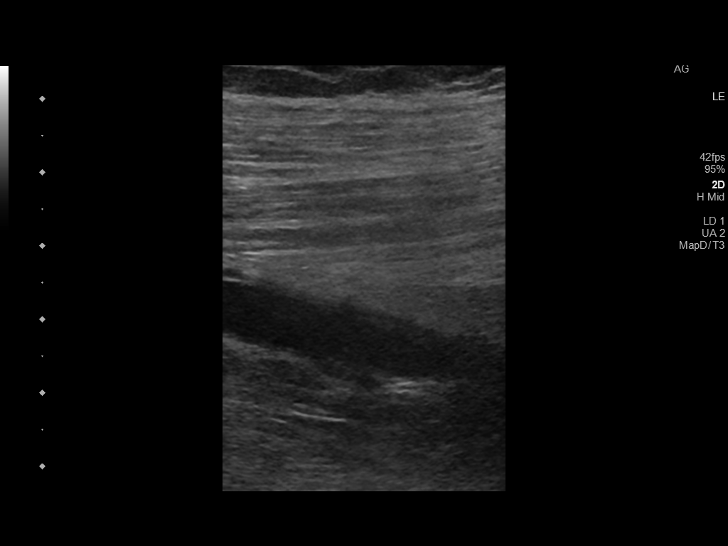
[im 16/59]
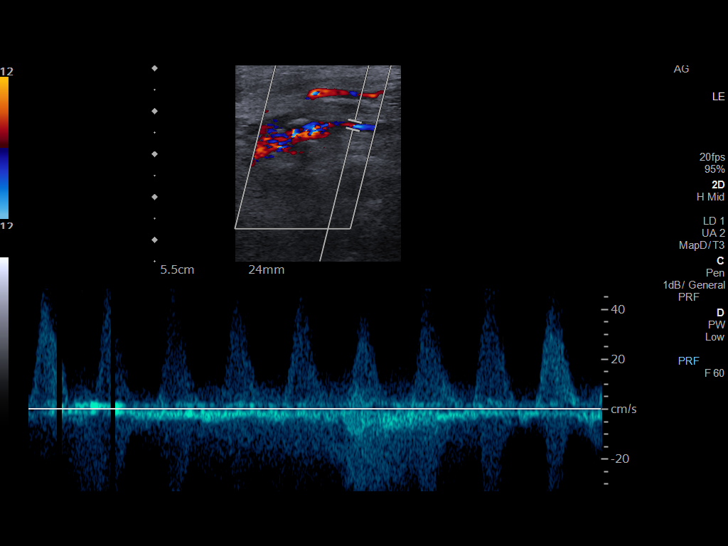
[im 21/59]
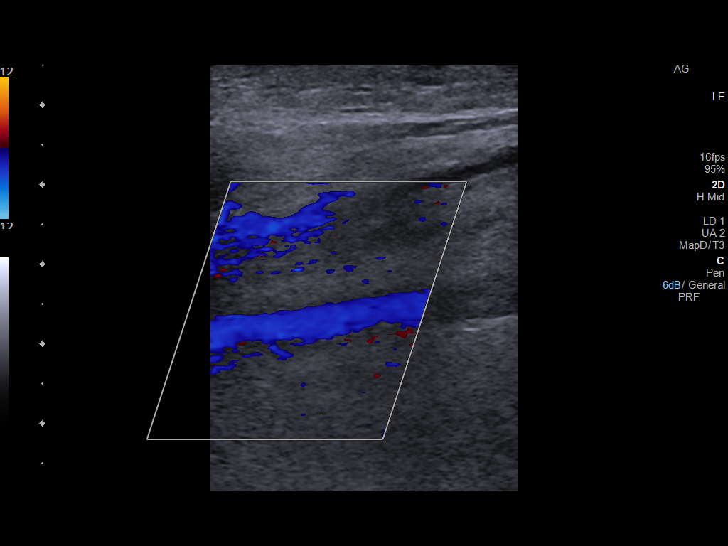
[im 26/59]
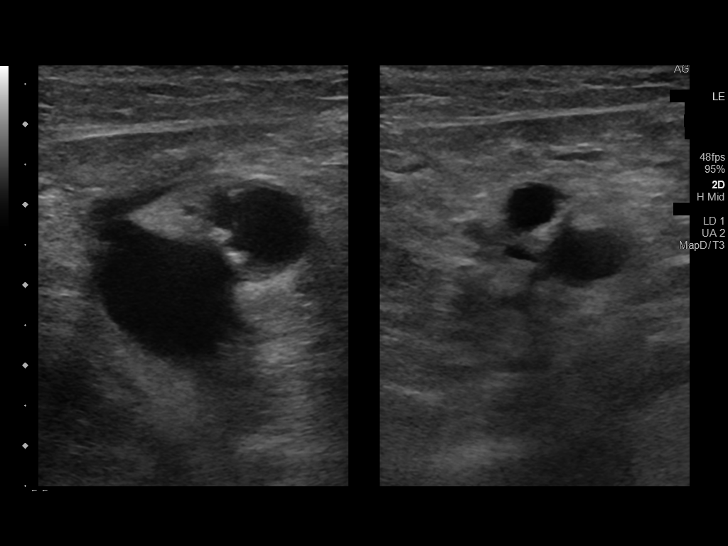
[im 31/59]
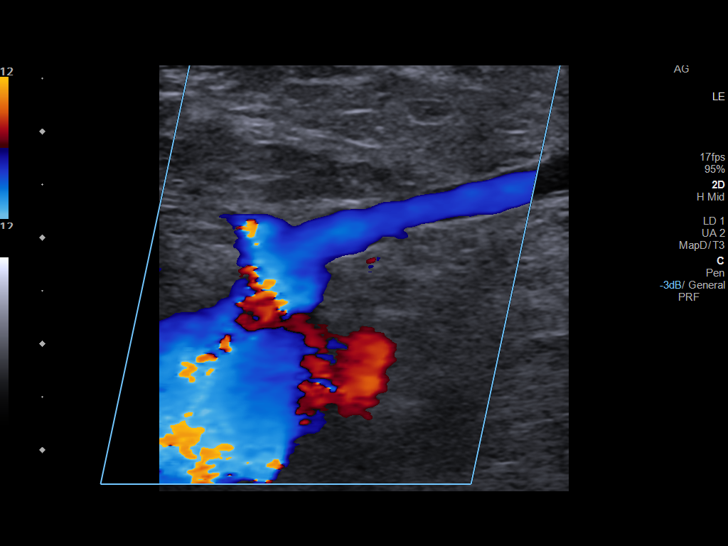
[im 33/59]
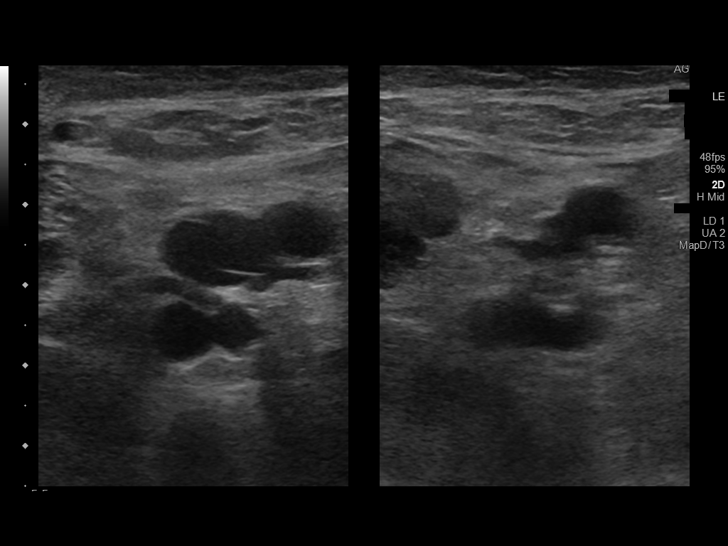
[im 38/59]
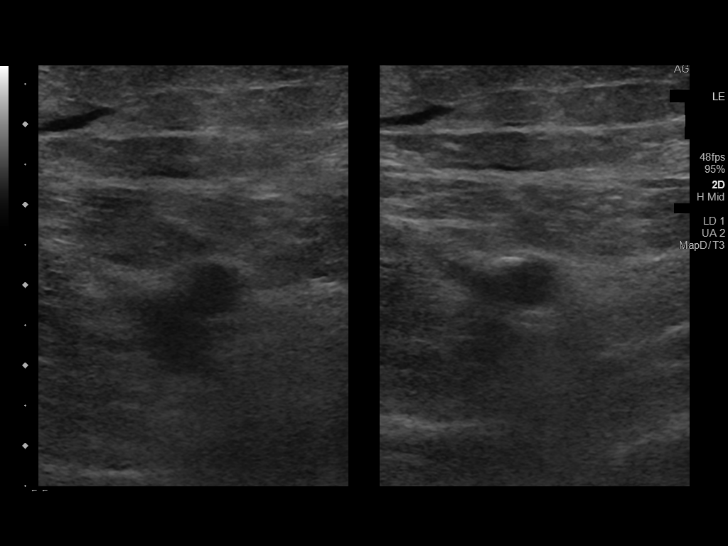
[im 43/59]
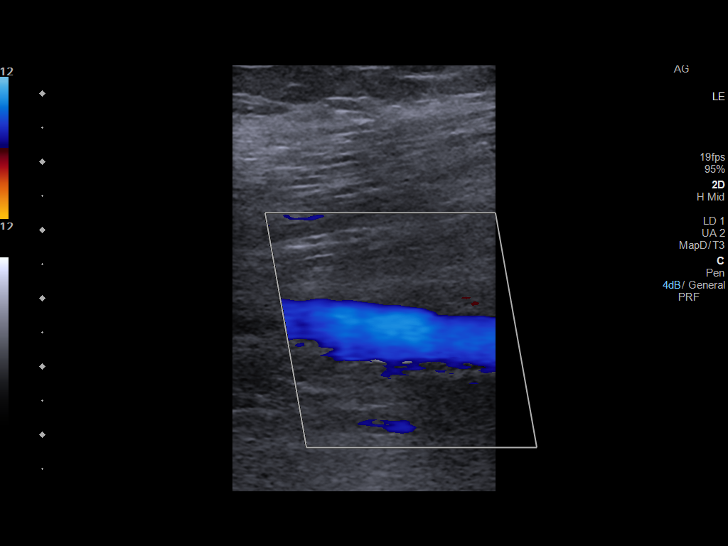
[im 48/59]
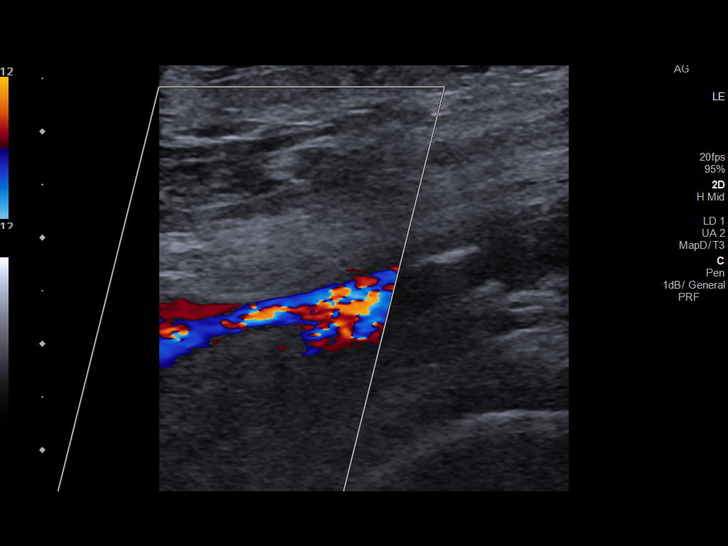
[im 53/59]
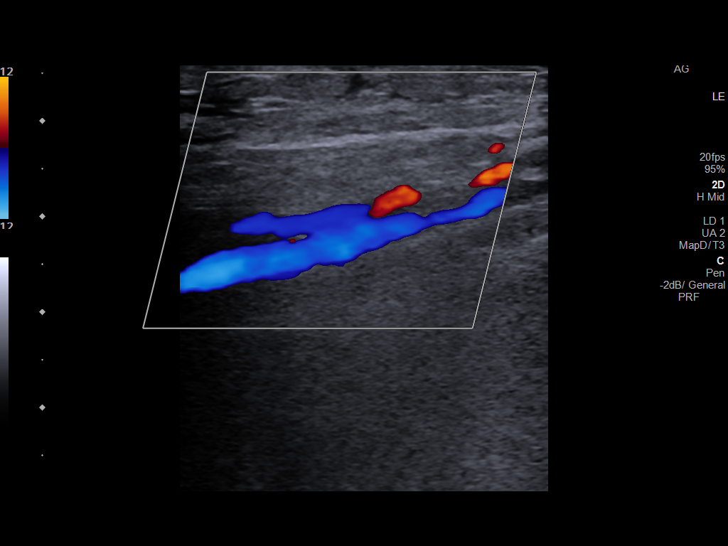
[im 59/59]
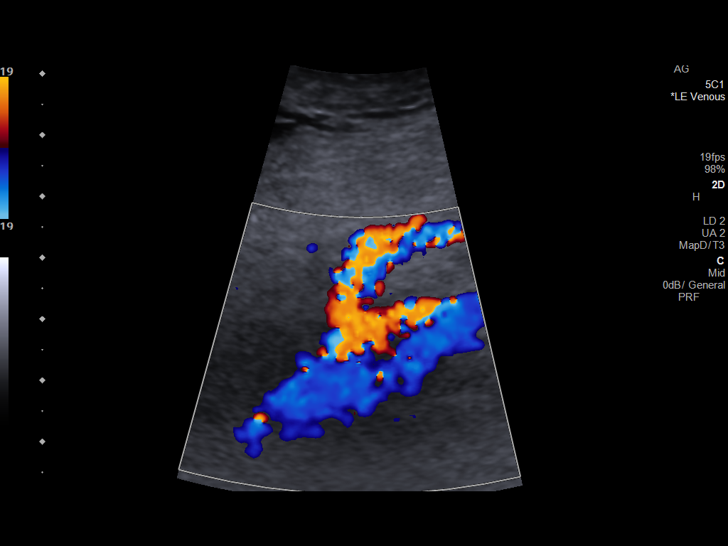

[13 of 24 positions shown; findings below may reference images not displayed]

FINDINGS: RIGHT LOWER EXTREMITY

The right common femoral vein, saphenofemoral junction and deep
femoral vein were unable to be visualized secondary to central
venous catheter bandages.

Femoral Vein: No evidence of thrombus. Normal compressibility,
respiratory phasicity and response to augmentation.

Popliteal Vein: No evidence of thrombus. Normal compressibility,
respiratory phasicity and response to augmentation.

Calf Veins: No evidence of thrombus. Normal compressibility and flow
on color Doppler imaging.

Superficial Great Saphenous Vein: No evidence of thrombus. Normal
compressibility.

Venous Reflux:  None.

Other Findings: Moderate amount of subcutaneous edema is noted at
the level of the right thigh and lower leg.

LEFT LOWER EXTREMITY

Common Femoral Vein: No evidence of thrombus. Normal
compressibility, respiratory phasicity and response to augmentation.

Saphenofemoral Junction: No evidence of thrombus. Normal
compressibility and flow on color Doppler imaging.

Profunda Femoral Vein: No evidence of thrombus. Normal
compressibility and flow on color Doppler imaging.

Femoral Vein: No evidence of thrombus. Normal compressibility,
respiratory phasicity and response to augmentation.

Popliteal Vein: No evidence of thrombus. Normal compressibility,
respiratory phasicity and response to augmentation.

Calf Veins: No evidence of thrombus. Normal compressibility and flow
on color Doppler imaging.

Superficial Great Saphenous Vein: No evidence of thrombus. Normal
compressibility.

Venous Reflux:  None.

Other Findings: Moderate amount of subcutaneous edema is noted at
the level of the left lower leg.
IMPRESSION: Degraded examination without evidence of DVT within either lower
extremity.

## 2019-07-28 IMAGING — CR PORTABLE CHEST - 1 VIEW
3 series · 3 of 3 positions shown · non-contrast
Comparison: Chest radiograph dated 01/09/2019

CLINICAL DATA: 85-year-old male with enteric tube placement.

EXAM:
PORTABLE CHEST 1 VIEW

[portable (1 of 3)]
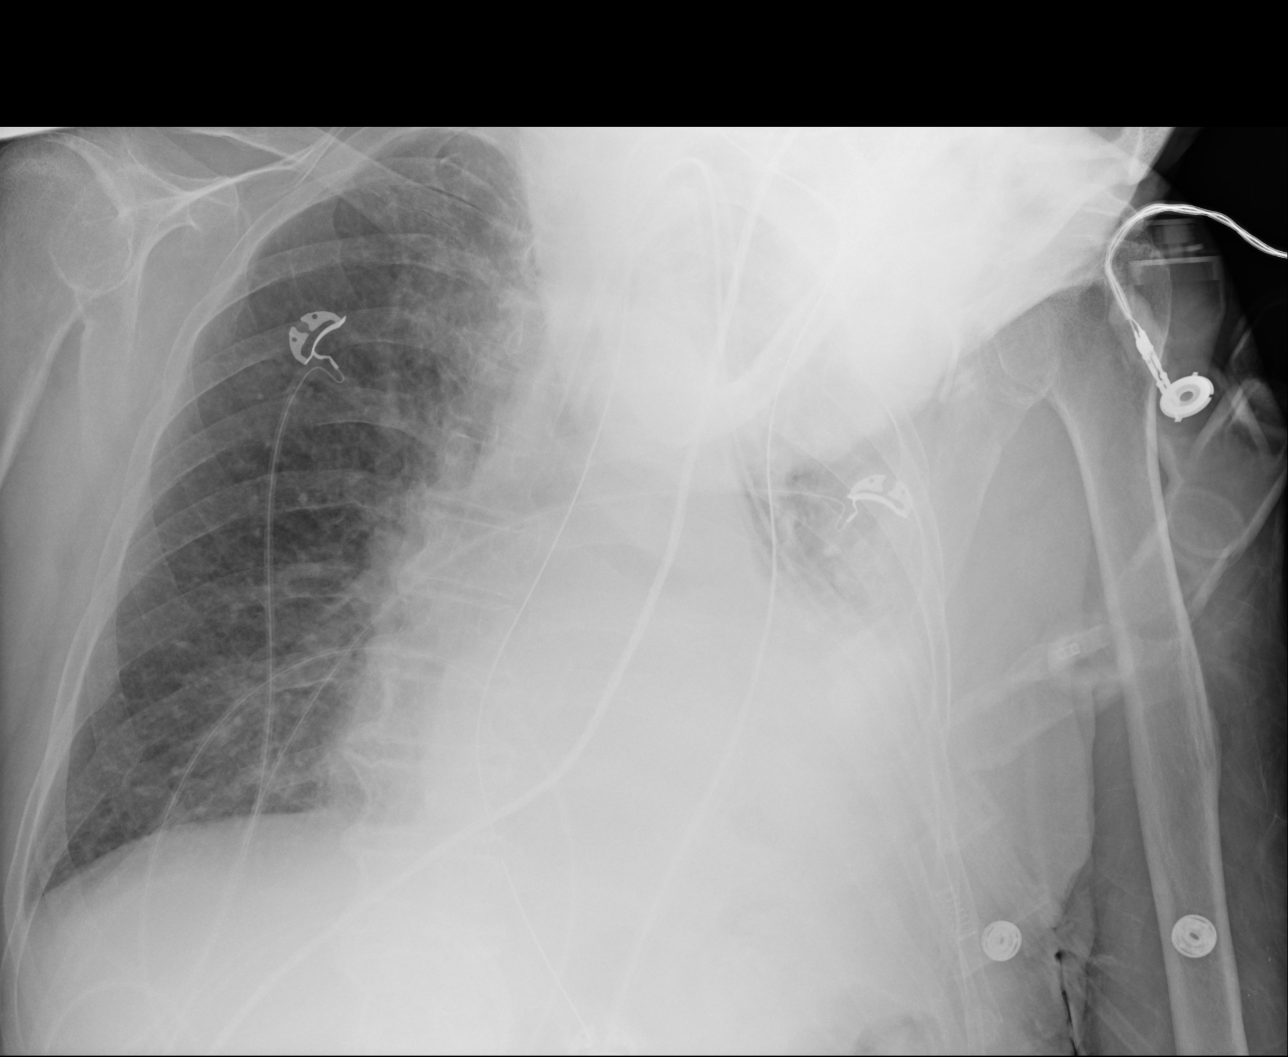

[portable (2 of 3)]
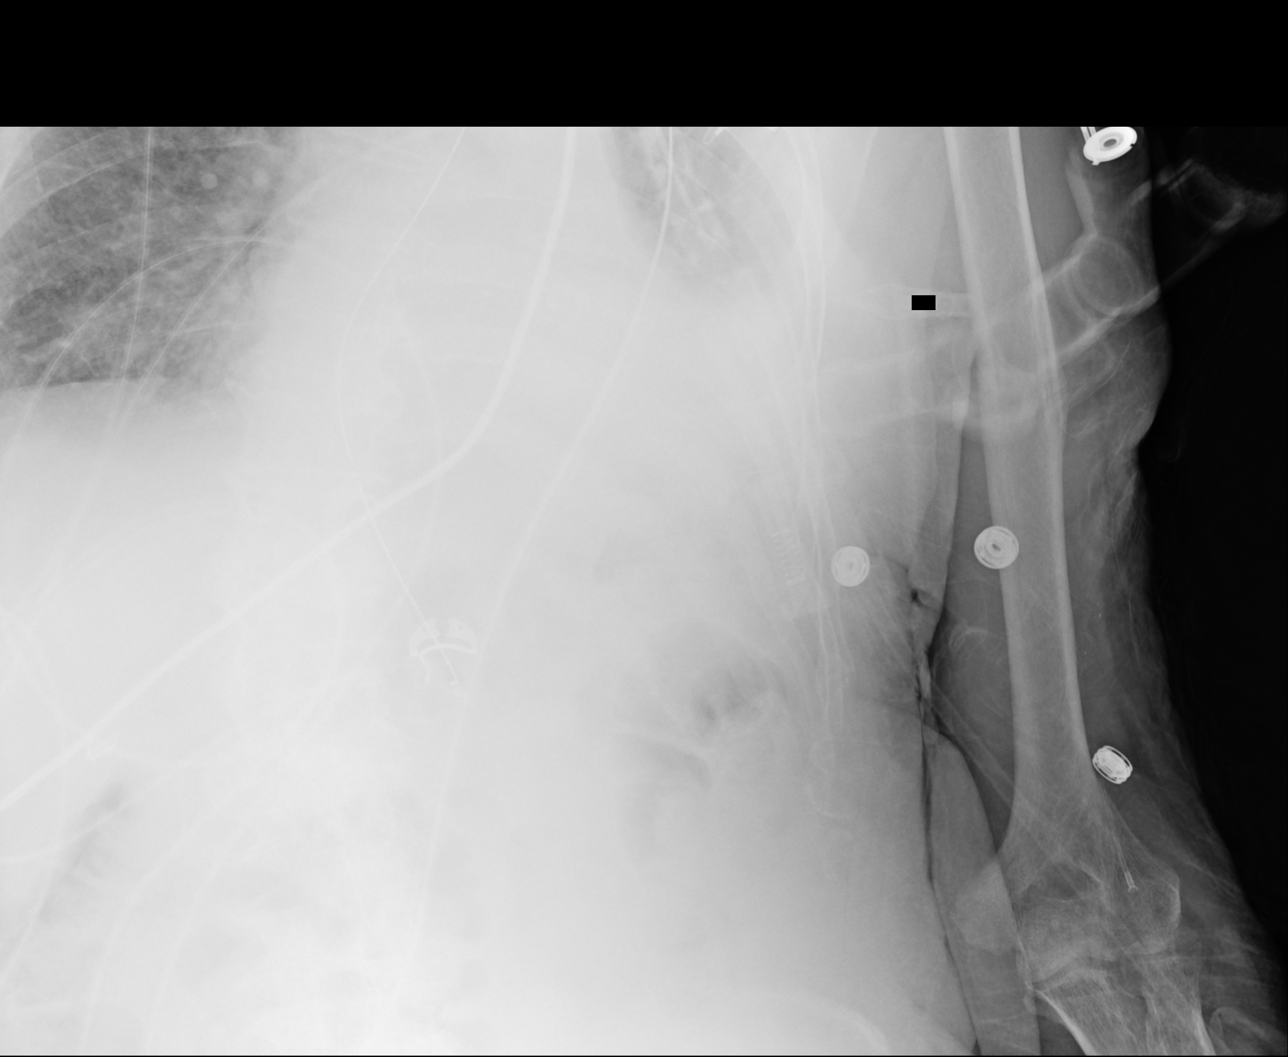

[portable (3 of 3)]
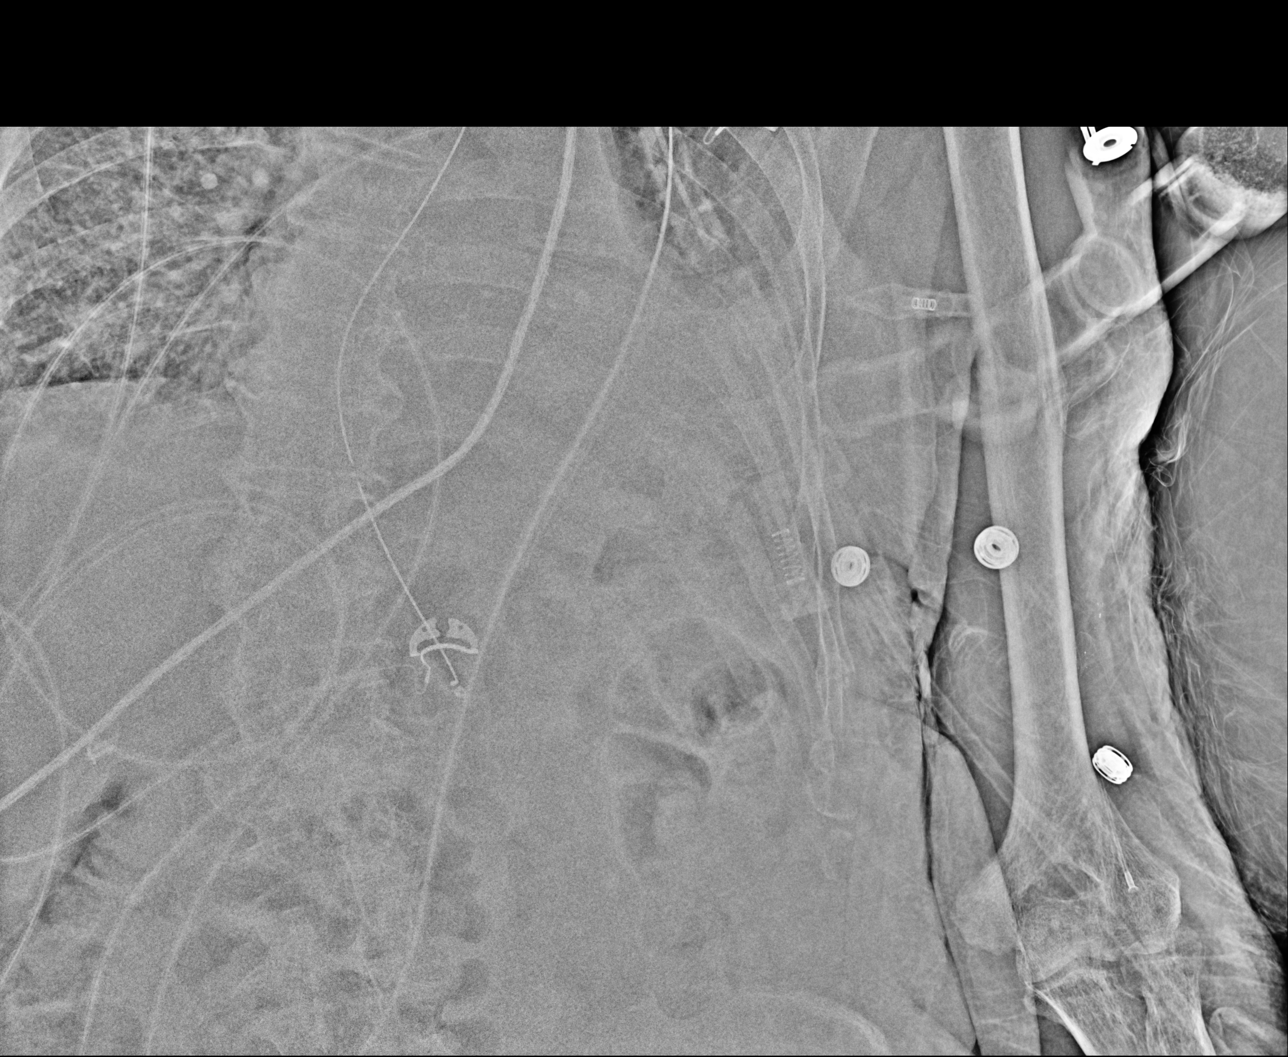

[3 of 3 positions shown; findings below may reference images not displayed]

FINDINGS: Evaluation is limited due to patient's positioning and portable
technique. An enteric tube is noted with side-port just distal to
the region of the gastroesophageal junction and tip in the proximal
stomach. An endotracheal tube appears above the carina. No
significant change in the appearance of the lungs or heart since the
earlier radiograph.
IMPRESSION: Enteric tube with side-port just distal to the GE junction and tip
in the proximal stomach. The tube can be further advanced by an
additional 5-7 cm for optimal positioning.

## 2019-07-29 IMAGING — CR PORTABLE CHEST - 1 VIEW
1 series · 1 of 1 positions shown · non-contrast
Comparison: 01/10/2019

CLINICAL DATA: Shortness of breath.  Respiratory failure.

EXAM:
PORTABLE CHEST 1 VIEW

[portable]
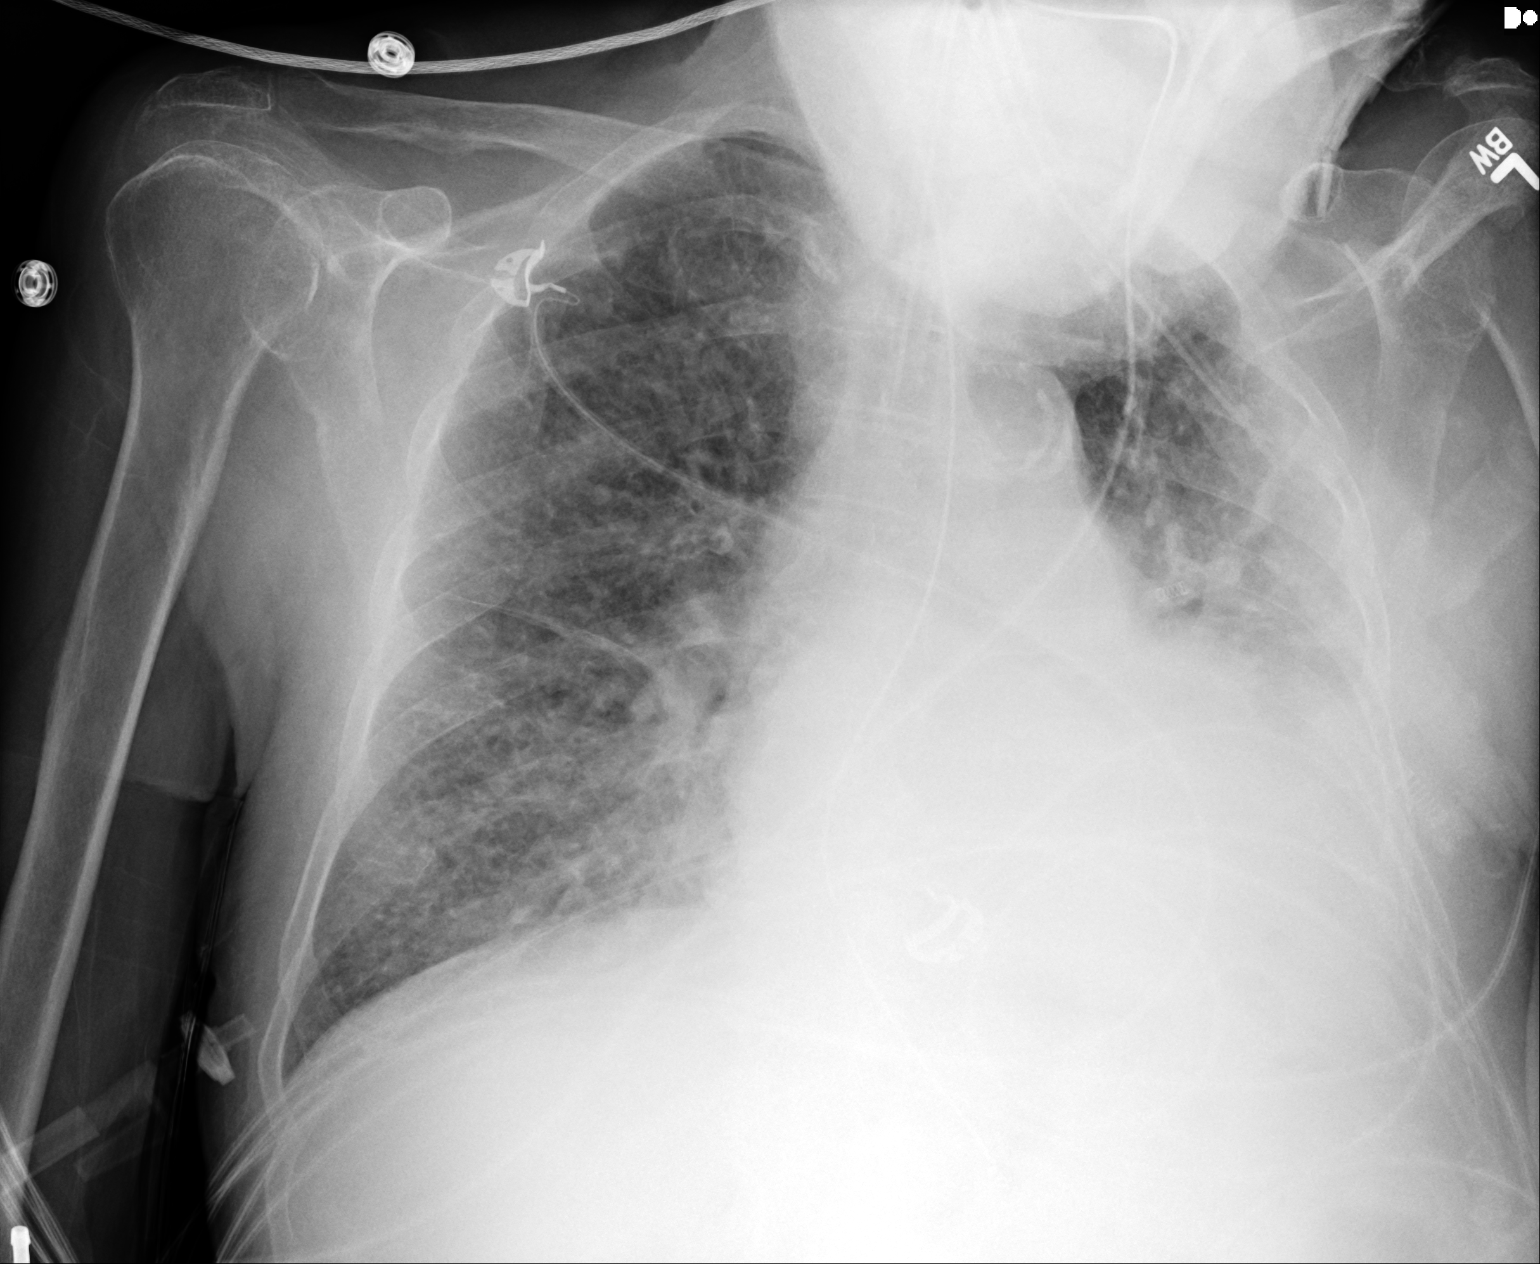

[1 of 1 positions shown; findings below may reference images not displayed]

FINDINGS: The endotracheal tube terminates above the carina by approximately
4.5 cm. The enteric tube it peers to extend below the left
hemidiaphragm. The cardiac silhouette is enlarged. Aortic
calcifications are noted. There is a left basilar/retrocardiac
opacity, similar to prior study. No pneumothorax. Prominent
interstitial lung markings are again noted
IMPRESSION: 1. Lines and tubes as above.
2. Persistent left basilar airspace opacity, not significantly
improved from prior study.
